# Patient Record
Sex: Female | Born: 1935
Health system: Southern US, Community
[De-identification: ages and names within clinical notes are randomized; demographics above are authoritative.]

## PROBLEM LIST (undated history)

## (undated) DIAGNOSIS — Z950 Presence of cardiac pacemaker: Secondary | ICD-10-CM

## (undated) DIAGNOSIS — I700 Atherosclerosis of aorta: Secondary | ICD-10-CM

## (undated) DIAGNOSIS — R350 Frequency of micturition: Secondary | ICD-10-CM

## (undated) DIAGNOSIS — M1990 Unspecified osteoarthritis, unspecified site: Secondary | ICD-10-CM

## (undated) DIAGNOSIS — IMO0001 Reserved for inherently not codable concepts without codable children: Secondary | ICD-10-CM

## (undated) DIAGNOSIS — I2699 Other pulmonary embolism without acute cor pulmonale: Secondary | ICD-10-CM

## (undated) DIAGNOSIS — I82442 Acute embolism and thrombosis of left tibial vein: Secondary | ICD-10-CM

## (undated) DIAGNOSIS — I441 Atrioventricular block, second degree: Secondary | ICD-10-CM

## (undated) DIAGNOSIS — R208 Other disturbances of skin sensation: Secondary | ICD-10-CM

## (undated) DIAGNOSIS — I10 Essential (primary) hypertension: Secondary | ICD-10-CM

## (undated) DIAGNOSIS — I82409 Acute embolism and thrombosis of unspecified deep veins of unspecified lower extremity: Secondary | ICD-10-CM

## (undated) HISTORY — PX: JOINT REPLACEMENT: SHX530

## (undated) HISTORY — PX: ABDOMINAL HYSTERECTOMY: SHX81

## (undated) HISTORY — PX: LAPAROSCOPIC CHOLECYSTECTOMY: SUR755

## (undated) HISTORY — PX: CATARACT EXTRACTION W/ INTRAOCULAR LENS  IMPLANT, BILATERAL: SHX1307

## (undated) HISTORY — PX: FRACTURE SURGERY: SHX138

---

## 1997-12-17 ENCOUNTER — Other Ambulatory Visit: Admission: RE | Admit: 1997-12-17 | Discharge: 1997-12-17 | Payer: Self-pay | Admitting: Obstetrics and Gynecology

## 1999-02-12 ENCOUNTER — Other Ambulatory Visit: Admission: RE | Admit: 1999-02-12 | Discharge: 1999-02-12 | Payer: Self-pay | Admitting: Obstetrics and Gynecology

## 2000-05-18 ENCOUNTER — Other Ambulatory Visit: Admission: RE | Admit: 2000-05-18 | Discharge: 2000-05-18 | Payer: Self-pay | Admitting: Obstetrics and Gynecology

## 2000-10-27 ENCOUNTER — Other Ambulatory Visit: Admission: RE | Admit: 2000-10-27 | Discharge: 2000-10-27 | Payer: Self-pay | Admitting: Orthopedic Surgery

## 2001-08-23 HISTORY — PX: ANKLE FRACTURE SURGERY: SHX122

## 2001-10-22 ENCOUNTER — Encounter: Payer: Self-pay | Admitting: Orthopaedic Surgery

## 2001-10-22 ENCOUNTER — Encounter: Payer: Self-pay | Admitting: Emergency Medicine

## 2001-10-22 ENCOUNTER — Inpatient Hospital Stay (HOSPITAL_COMMUNITY): Admission: EM | Admit: 2001-10-22 | Discharge: 2001-10-25 | Payer: Self-pay | Admitting: Emergency Medicine

## 2001-10-25 ENCOUNTER — Inpatient Hospital Stay (HOSPITAL_COMMUNITY)
Admission: RE | Admit: 2001-10-25 | Discharge: 2001-11-02 | Payer: Self-pay | Admitting: Physical Medicine & Rehabilitation

## 2001-12-05 ENCOUNTER — Encounter: Admission: RE | Admit: 2001-12-05 | Discharge: 2002-02-14 | Payer: Self-pay | Admitting: Orthopaedic Surgery

## 2002-08-06 ENCOUNTER — Other Ambulatory Visit: Admission: RE | Admit: 2002-08-06 | Discharge: 2002-08-06 | Payer: Self-pay | Admitting: Obstetrics and Gynecology

## 2003-10-18 ENCOUNTER — Other Ambulatory Visit: Admission: RE | Admit: 2003-10-18 | Discharge: 2003-10-18 | Payer: Self-pay | Admitting: Obstetrics and Gynecology

## 2009-09-04 DIAGNOSIS — E785 Hyperlipidemia, unspecified: Secondary | ICD-10-CM | POA: Insufficient documentation

## 2009-09-04 DIAGNOSIS — M489 Spondylopathy, unspecified: Secondary | ICD-10-CM | POA: Insufficient documentation

## 2009-09-04 DIAGNOSIS — I10 Essential (primary) hypertension: Secondary | ICD-10-CM | POA: Insufficient documentation

## 2009-09-04 DIAGNOSIS — I7090 Unspecified atherosclerosis: Secondary | ICD-10-CM | POA: Insufficient documentation

## 2009-09-04 DIAGNOSIS — D649 Anemia, unspecified: Secondary | ICD-10-CM | POA: Insufficient documentation

## 2009-09-04 DIAGNOSIS — I129 Hypertensive chronic kidney disease with stage 1 through stage 4 chronic kidney disease, or unspecified chronic kidney disease: Secondary | ICD-10-CM | POA: Insufficient documentation

## 2009-09-04 DIAGNOSIS — K449 Diaphragmatic hernia without obstruction or gangrene: Secondary | ICD-10-CM | POA: Insufficient documentation

## 2009-09-16 DIAGNOSIS — M810 Age-related osteoporosis without current pathological fracture: Secondary | ICD-10-CM | POA: Insufficient documentation

## 2009-09-16 DIAGNOSIS — K862 Cyst of pancreas: Secondary | ICD-10-CM | POA: Insufficient documentation

## 2010-03-31 DIAGNOSIS — I872 Venous insufficiency (chronic) (peripheral): Secondary | ICD-10-CM | POA: Insufficient documentation

## 2010-09-17 DIAGNOSIS — J309 Allergic rhinitis, unspecified: Secondary | ICD-10-CM | POA: Insufficient documentation

## 2010-12-29 ENCOUNTER — Emergency Department (HOSPITAL_BASED_OUTPATIENT_CLINIC_OR_DEPARTMENT_OTHER)
Admission: EM | Admit: 2010-12-29 | Discharge: 2010-12-29 | Disposition: A | Discharge status: Home / Self Care | Payer: Medicare Other | Attending: Emergency Medicine | Admitting: Emergency Medicine

## 2010-12-29 ENCOUNTER — Emergency Department (INDEPENDENT_AMBULATORY_CARE_PROVIDER_SITE_OTHER): Payer: Medicare Other

## 2010-12-29 DIAGNOSIS — I517 Cardiomegaly: Secondary | ICD-10-CM | POA: Insufficient documentation

## 2010-12-29 DIAGNOSIS — I10 Essential (primary) hypertension: Secondary | ICD-10-CM | POA: Insufficient documentation

## 2010-12-29 DIAGNOSIS — IMO0002 Reserved for concepts with insufficient information to code with codable children: Secondary | ICD-10-CM | POA: Insufficient documentation

## 2010-12-29 DIAGNOSIS — M549 Dorsalgia, unspecified: Secondary | ICD-10-CM

## 2010-12-29 DIAGNOSIS — X58XXXA Exposure to other specified factors, initial encounter: Secondary | ICD-10-CM | POA: Insufficient documentation

## 2010-12-29 DIAGNOSIS — M47814 Spondylosis without myelopathy or radiculopathy, thoracic region: Secondary | ICD-10-CM

## 2011-09-16 DIAGNOSIS — E119 Type 2 diabetes mellitus without complications: Secondary | ICD-10-CM | POA: Diagnosis not present

## 2011-09-16 DIAGNOSIS — M899 Disorder of bone, unspecified: Secondary | ICD-10-CM | POA: Diagnosis not present

## 2011-09-16 DIAGNOSIS — I10 Essential (primary) hypertension: Secondary | ICD-10-CM | POA: Diagnosis not present

## 2011-09-16 DIAGNOSIS — M949 Disorder of cartilage, unspecified: Secondary | ICD-10-CM | POA: Diagnosis not present

## 2011-09-16 DIAGNOSIS — E785 Hyperlipidemia, unspecified: Secondary | ICD-10-CM | POA: Diagnosis not present

## 2011-09-23 DIAGNOSIS — Z0000 Encounter for general adult medical examination without abnormal findings: Secondary | ICD-10-CM | POA: Diagnosis not present

## 2011-09-23 DIAGNOSIS — IMO0002 Reserved for concepts with insufficient information to code with codable children: Secondary | ICD-10-CM | POA: Diagnosis not present

## 2011-09-23 DIAGNOSIS — J309 Allergic rhinitis, unspecified: Secondary | ICD-10-CM | POA: Diagnosis not present

## 2011-09-23 DIAGNOSIS — E119 Type 2 diabetes mellitus without complications: Secondary | ICD-10-CM | POA: Diagnosis not present

## 2011-09-23 DIAGNOSIS — N3281 Overactive bladder: Secondary | ICD-10-CM | POA: Insufficient documentation

## 2011-09-27 DIAGNOSIS — K862 Cyst of pancreas: Secondary | ICD-10-CM | POA: Diagnosis not present

## 2011-09-27 DIAGNOSIS — Z1212 Encounter for screening for malignant neoplasm of rectum: Secondary | ICD-10-CM | POA: Diagnosis not present

## 2012-01-06 DIAGNOSIS — IMO0002 Reserved for concepts with insufficient information to code with codable children: Secondary | ICD-10-CM | POA: Diagnosis not present

## 2012-01-06 DIAGNOSIS — E119 Type 2 diabetes mellitus without complications: Secondary | ICD-10-CM | POA: Diagnosis not present

## 2012-01-06 DIAGNOSIS — L259 Unspecified contact dermatitis, unspecified cause: Secondary | ICD-10-CM | POA: Diagnosis not present

## 2012-01-06 DIAGNOSIS — I10 Essential (primary) hypertension: Secondary | ICD-10-CM | POA: Diagnosis not present

## 2012-04-07 DIAGNOSIS — Z1231 Encounter for screening mammogram for malignant neoplasm of breast: Secondary | ICD-10-CM | POA: Diagnosis not present

## 2012-04-07 DIAGNOSIS — Z803 Family history of malignant neoplasm of breast: Secondary | ICD-10-CM | POA: Diagnosis not present

## 2012-05-08 DIAGNOSIS — E119 Type 2 diabetes mellitus without complications: Secondary | ICD-10-CM | POA: Diagnosis not present

## 2012-05-08 DIAGNOSIS — Z23 Encounter for immunization: Secondary | ICD-10-CM | POA: Diagnosis not present

## 2012-05-08 DIAGNOSIS — L259 Unspecified contact dermatitis, unspecified cause: Secondary | ICD-10-CM | POA: Diagnosis not present

## 2012-05-08 DIAGNOSIS — J309 Allergic rhinitis, unspecified: Secondary | ICD-10-CM | POA: Diagnosis not present

## 2012-06-08 DIAGNOSIS — Z23 Encounter for immunization: Secondary | ICD-10-CM | POA: Diagnosis not present

## 2012-07-19 DIAGNOSIS — L820 Inflamed seborrheic keratosis: Secondary | ICD-10-CM | POA: Diagnosis not present

## 2012-07-19 DIAGNOSIS — B353 Tinea pedis: Secondary | ICD-10-CM | POA: Diagnosis not present

## 2012-10-30 DIAGNOSIS — E119 Type 2 diabetes mellitus without complications: Secondary | ICD-10-CM | POA: Diagnosis not present

## 2012-10-30 DIAGNOSIS — I10 Essential (primary) hypertension: Secondary | ICD-10-CM | POA: Diagnosis not present

## 2012-10-30 DIAGNOSIS — Z0000 Encounter for general adult medical examination without abnormal findings: Secondary | ICD-10-CM | POA: Diagnosis not present

## 2012-10-30 DIAGNOSIS — R82998 Other abnormal findings in urine: Secondary | ICD-10-CM | POA: Diagnosis not present

## 2012-10-30 DIAGNOSIS — E785 Hyperlipidemia, unspecified: Secondary | ICD-10-CM | POA: Diagnosis not present

## 2012-10-30 DIAGNOSIS — M899 Disorder of bone, unspecified: Secondary | ICD-10-CM | POA: Diagnosis not present

## 2012-11-09 DIAGNOSIS — IMO0002 Reserved for concepts with insufficient information to code with codable children: Secondary | ICD-10-CM | POA: Diagnosis not present

## 2012-11-09 DIAGNOSIS — N318 Other neuromuscular dysfunction of bladder: Secondary | ICD-10-CM | POA: Diagnosis not present

## 2012-11-09 DIAGNOSIS — J309 Allergic rhinitis, unspecified: Secondary | ICD-10-CM | POA: Diagnosis not present

## 2012-11-09 DIAGNOSIS — E119 Type 2 diabetes mellitus without complications: Secondary | ICD-10-CM | POA: Diagnosis not present

## 2012-11-09 DIAGNOSIS — M810 Age-related osteoporosis without current pathological fracture: Secondary | ICD-10-CM | POA: Diagnosis not present

## 2012-11-09 DIAGNOSIS — R82998 Other abnormal findings in urine: Secondary | ICD-10-CM | POA: Diagnosis not present

## 2012-11-09 DIAGNOSIS — K862 Cyst of pancreas: Secondary | ICD-10-CM | POA: Diagnosis not present

## 2012-11-09 DIAGNOSIS — Z1212 Encounter for screening for malignant neoplasm of rectum: Secondary | ICD-10-CM | POA: Diagnosis not present

## 2012-11-09 DIAGNOSIS — K449 Diaphragmatic hernia without obstruction or gangrene: Secondary | ICD-10-CM | POA: Diagnosis not present

## 2012-11-09 DIAGNOSIS — Z0000 Encounter for general adult medical examination without abnormal findings: Secondary | ICD-10-CM | POA: Diagnosis not present

## 2013-05-10 DIAGNOSIS — Z803 Family history of malignant neoplasm of breast: Secondary | ICD-10-CM | POA: Diagnosis not present

## 2013-05-10 DIAGNOSIS — Z1231 Encounter for screening mammogram for malignant neoplasm of breast: Secondary | ICD-10-CM | POA: Diagnosis not present

## 2013-06-14 DIAGNOSIS — Z23 Encounter for immunization: Secondary | ICD-10-CM | POA: Diagnosis not present

## 2013-11-06 DIAGNOSIS — E785 Hyperlipidemia, unspecified: Secondary | ICD-10-CM | POA: Diagnosis not present

## 2013-11-06 DIAGNOSIS — M899 Disorder of bone, unspecified: Secondary | ICD-10-CM | POA: Diagnosis not present

## 2013-11-06 DIAGNOSIS — M949 Disorder of cartilage, unspecified: Secondary | ICD-10-CM | POA: Diagnosis not present

## 2013-11-06 DIAGNOSIS — I10 Essential (primary) hypertension: Secondary | ICD-10-CM | POA: Diagnosis not present

## 2013-11-06 DIAGNOSIS — R82998 Other abnormal findings in urine: Secondary | ICD-10-CM | POA: Diagnosis not present

## 2013-11-06 DIAGNOSIS — E1169 Type 2 diabetes mellitus with other specified complication: Secondary | ICD-10-CM | POA: Diagnosis not present

## 2013-11-13 DIAGNOSIS — E119 Type 2 diabetes mellitus without complications: Secondary | ICD-10-CM | POA: Diagnosis not present

## 2013-11-13 DIAGNOSIS — I10 Essential (primary) hypertension: Secondary | ICD-10-CM | POA: Diagnosis not present

## 2013-11-13 DIAGNOSIS — IMO0002 Reserved for concepts with insufficient information to code with codable children: Secondary | ICD-10-CM | POA: Diagnosis not present

## 2013-11-13 DIAGNOSIS — K863 Pseudocyst of pancreas: Secondary | ICD-10-CM | POA: Diagnosis not present

## 2013-11-13 DIAGNOSIS — K862 Cyst of pancreas: Secondary | ICD-10-CM | POA: Diagnosis not present

## 2013-11-13 DIAGNOSIS — Z0000 Encounter for general adult medical examination without abnormal findings: Secondary | ICD-10-CM | POA: Diagnosis not present

## 2013-11-13 DIAGNOSIS — K449 Diaphragmatic hernia without obstruction or gangrene: Secondary | ICD-10-CM | POA: Diagnosis not present

## 2013-11-13 DIAGNOSIS — E785 Hyperlipidemia, unspecified: Secondary | ICD-10-CM | POA: Diagnosis not present

## 2013-11-13 DIAGNOSIS — I452 Bifascicular block: Secondary | ICD-10-CM | POA: Diagnosis not present

## 2013-11-14 DIAGNOSIS — Z1212 Encounter for screening for malignant neoplasm of rectum: Secondary | ICD-10-CM | POA: Diagnosis not present

## 2014-06-11 DIAGNOSIS — Z23 Encounter for immunization: Secondary | ICD-10-CM | POA: Diagnosis not present

## 2014-06-21 DIAGNOSIS — Z1231 Encounter for screening mammogram for malignant neoplasm of breast: Secondary | ICD-10-CM | POA: Diagnosis not present

## 2014-06-21 DIAGNOSIS — Z803 Family history of malignant neoplasm of breast: Secondary | ICD-10-CM | POA: Diagnosis not present

## 2014-11-19 DIAGNOSIS — E119 Type 2 diabetes mellitus without complications: Secondary | ICD-10-CM | POA: Diagnosis not present

## 2014-11-19 DIAGNOSIS — M810 Age-related osteoporosis without current pathological fracture: Secondary | ICD-10-CM | POA: Diagnosis not present

## 2014-11-19 DIAGNOSIS — R8299 Other abnormal findings in urine: Secondary | ICD-10-CM | POA: Diagnosis not present

## 2014-11-19 DIAGNOSIS — I10 Essential (primary) hypertension: Secondary | ICD-10-CM | POA: Diagnosis not present

## 2014-11-19 DIAGNOSIS — I7090 Unspecified atherosclerosis: Secondary | ICD-10-CM | POA: Diagnosis not present

## 2014-11-19 DIAGNOSIS — E785 Hyperlipidemia, unspecified: Secondary | ICD-10-CM | POA: Diagnosis not present

## 2014-11-29 DIAGNOSIS — N183 Chronic kidney disease, stage 3 (moderate): Secondary | ICD-10-CM | POA: Diagnosis not present

## 2014-11-29 DIAGNOSIS — K862 Cyst of pancreas: Secondary | ICD-10-CM | POA: Diagnosis not present

## 2014-11-29 DIAGNOSIS — E1151 Type 2 diabetes mellitus with diabetic peripheral angiopathy without gangrene: Secondary | ICD-10-CM | POA: Diagnosis not present

## 2014-11-29 DIAGNOSIS — K449 Diaphragmatic hernia without obstruction or gangrene: Secondary | ICD-10-CM | POA: Diagnosis not present

## 2014-11-29 DIAGNOSIS — E785 Hyperlipidemia, unspecified: Secondary | ICD-10-CM | POA: Diagnosis not present

## 2014-11-29 DIAGNOSIS — Z1389 Encounter for screening for other disorder: Secondary | ICD-10-CM | POA: Diagnosis not present

## 2014-11-29 DIAGNOSIS — Z6831 Body mass index (BMI) 31.0-31.9, adult: Secondary | ICD-10-CM | POA: Diagnosis not present

## 2014-11-29 DIAGNOSIS — I10 Essential (primary) hypertension: Secondary | ICD-10-CM | POA: Diagnosis not present

## 2014-11-29 DIAGNOSIS — M25551 Pain in right hip: Secondary | ICD-10-CM | POA: Diagnosis not present

## 2014-11-29 DIAGNOSIS — Z23 Encounter for immunization: Secondary | ICD-10-CM | POA: Diagnosis not present

## 2014-11-29 DIAGNOSIS — Z0000 Encounter for general adult medical examination without abnormal findings: Secondary | ICD-10-CM | POA: Diagnosis not present

## 2014-11-29 DIAGNOSIS — I7090 Unspecified atherosclerosis: Secondary | ICD-10-CM | POA: Diagnosis not present

## 2014-11-29 DIAGNOSIS — Z1212 Encounter for screening for malignant neoplasm of rectum: Secondary | ICD-10-CM | POA: Diagnosis not present

## 2014-12-02 DIAGNOSIS — Z961 Presence of intraocular lens: Secondary | ICD-10-CM | POA: Diagnosis not present

## 2014-12-02 DIAGNOSIS — H52203 Unspecified astigmatism, bilateral: Secondary | ICD-10-CM | POA: Diagnosis not present

## 2014-12-02 DIAGNOSIS — H3531 Nonexudative age-related macular degeneration: Secondary | ICD-10-CM | POA: Diagnosis not present

## 2014-12-16 DIAGNOSIS — M1611 Unilateral primary osteoarthritis, right hip: Secondary | ICD-10-CM | POA: Diagnosis not present

## 2014-12-16 DIAGNOSIS — M25551 Pain in right hip: Secondary | ICD-10-CM | POA: Diagnosis not present

## 2014-12-16 DIAGNOSIS — M1711 Unilateral primary osteoarthritis, right knee: Secondary | ICD-10-CM | POA: Diagnosis not present

## 2014-12-16 DIAGNOSIS — M25561 Pain in right knee: Secondary | ICD-10-CM | POA: Diagnosis not present

## 2014-12-24 DIAGNOSIS — M1611 Unilateral primary osteoarthritis, right hip: Secondary | ICD-10-CM | POA: Diagnosis not present

## 2014-12-24 DIAGNOSIS — M25551 Pain in right hip: Secondary | ICD-10-CM | POA: Diagnosis not present

## 2015-01-27 DIAGNOSIS — M25551 Pain in right hip: Secondary | ICD-10-CM | POA: Diagnosis not present

## 2015-01-27 DIAGNOSIS — M1611 Unilateral primary osteoarthritis, right hip: Secondary | ICD-10-CM | POA: Diagnosis not present

## 2015-03-06 ENCOUNTER — Other Ambulatory Visit (HOSPITAL_COMMUNITY): Payer: Self-pay | Admitting: Orthopaedic Surgery

## 2015-03-10 ENCOUNTER — Other Ambulatory Visit (HOSPITAL_COMMUNITY): Payer: Self-pay | Admitting: Orthopaedic Surgery

## 2015-03-17 NOTE — Pre-Procedure Instructions (Signed)
MARLETTA BOUSQUET  03/17/2015     Your procedure is scheduled on : Tuesday March 25, 2015 at 1:00 PM.  Report to [facility] Admitting at 11:00 A.M.  Call this number if you have problems the morning of surgery: [phone]    Remember:  Do not eat food or drink liquids after midnight.  Take these medicines the morning of surgery with A SIP OF WATER : NONE   Please stop taking any vitamins, herbal medications, supplements, Ibuprofen, Aleve, Fish oil, etc   Do not wear jewelry, make-up or nail polish.  Do not wear lotions, powders, or perfumes.   Do not shave 48 hours prior to surgery.  Do not bring valuables to the hospital.  [facility] is not responsible for any belongings or valuables.  Contacts, dentures or bridgework may not be worn into surgery.  Leave your suitcase in the car.  After surgery it may be brought to your room.  For patients admitted to the hospital, discharge time will be determined by your treatment team.  Patients discharged the day of surgery will not be allowed to drive home.   Name and phone number of your driver:    Special instructions:  Shower using CHG soap the night before and the morning of your surgery  Please read over the following fact sheets that you were given. Pain Booklet, Coughing and Deep Breathing, Total Joint Packet, MRSA Information and Surgical Site Infection Prevention

## 2015-03-18 ENCOUNTER — Encounter (HOSPITAL_COMMUNITY)
Admission: RE | Admit: 2015-03-18 | Discharge: 2015-03-18 | Disposition: A | Discharge status: Home / Self Care | Payer: Medicare Other | Source: Ambulatory Visit | Attending: Orthopaedic Surgery | Admitting: Orthopaedic Surgery

## 2015-03-18 ENCOUNTER — Encounter (HOSPITAL_COMMUNITY): Payer: Self-pay

## 2015-03-18 DIAGNOSIS — R9431 Abnormal electrocardiogram [ECG] [EKG]: Secondary | ICD-10-CM | POA: Insufficient documentation

## 2015-03-18 DIAGNOSIS — Z01812 Encounter for preprocedural laboratory examination: Secondary | ICD-10-CM | POA: Insufficient documentation

## 2015-03-18 DIAGNOSIS — Z01818 Encounter for other preprocedural examination: Secondary | ICD-10-CM | POA: Diagnosis not present

## 2015-03-18 DIAGNOSIS — I10 Essential (primary) hypertension: Secondary | ICD-10-CM | POA: Insufficient documentation

## 2015-03-18 DIAGNOSIS — M1611 Unilateral primary osteoarthritis, right hip: Secondary | ICD-10-CM | POA: Diagnosis not present

## 2015-03-18 DIAGNOSIS — Z7982 Long term (current) use of aspirin: Secondary | ICD-10-CM | POA: Insufficient documentation

## 2015-03-18 DIAGNOSIS — Z79899 Other long term (current) drug therapy: Secondary | ICD-10-CM | POA: Insufficient documentation

## 2015-03-18 HISTORY — DX: Other disturbances of skin sensation: R20.8

## 2015-03-18 HISTORY — DX: Reserved for inherently not codable concepts without codable children: IMO0001

## 2015-03-18 HISTORY — DX: Unspecified osteoarthritis, unspecified site: M19.90

## 2015-03-18 HISTORY — DX: Frequency of micturition: R35.0

## 2015-03-18 HISTORY — DX: Essential (primary) hypertension: I10

## 2015-03-18 LAB — CBC
HEMATOCRIT: 43.2 % (ref 36.0–46.0)
Hemoglobin: 13.6 g/dL (ref 12.0–15.0)
MCH: 26.6 pg (ref 26.0–34.0)
MCHC: 31.5 g/dL (ref 30.0–36.0)
MCV: 84.5 fL (ref 78.0–100.0)
Platelets: 225 10*3/uL (ref 150–400)
RBC: 5.11 MIL/uL (ref 3.87–5.11)
RDW: 15 % (ref 11.5–15.5)
WBC: 6.6 10*3/uL (ref 4.0–10.5)

## 2015-03-18 LAB — COMPREHENSIVE METABOLIC PANEL
ALBUMIN: 4 g/dL (ref 3.5–5.0)
ALT: 14 U/L (ref 14–54)
AST: 20 U/L (ref 15–41)
Alkaline Phosphatase: 47 U/L (ref 38–126)
Anion gap: 7 (ref 5–15)
BILIRUBIN TOTAL: 0.8 mg/dL (ref 0.3–1.2)
BUN: 22 mg/dL — AB (ref 6–20)
CO2: 31 mmol/L (ref 22–32)
CREATININE: 0.72 mg/dL (ref 0.44–1.00)
Calcium: 10.4 mg/dL — ABNORMAL HIGH (ref 8.9–10.3)
Chloride: 102 mmol/L (ref 101–111)
GFR calc non Af Amer: >60 mL/min (ref >60)
GLUCOSE: 122 mg/dL — AB (ref 65–99)
POTASSIUM: 4.6 mmol/L (ref 3.5–5.1)
Sodium: 140 mmol/L (ref 135–145)
TOTAL PROTEIN: 7.3 g/dL (ref 6.5–8.1)

## 2015-03-18 LAB — SURGICAL PCR SCREEN
MRSA, PCR: NEGATIVE
STAPHYLOCOCCUS AUREUS: NEGATIVE

## 2015-03-18 NOTE — Progress Notes (Signed)
PCP is Ravisankar Avva. Patient denied having any acute cardiac or pulmonary issues.   Patients husband at chair side during PAT visit.   RBBB noted on patients EKG. Will request a EKG for comparison from PCP office.

## 2015-03-19 NOTE — Progress Notes (Signed)
Anesthesia Chart Review:  Pt is 81 year old female scheduled for R total hip arthroplasty anterior approach on 03/25/2015 with Dr. Maureen Ralphs.   PCP is Dr. Felipa Eth.   PMH includes: HTN, dyspnea with exertion. Never smoker. BMI 31.   Medications include: ASA, olmesartan-hctz.   Preoperative labs reviewed.    EKG 03/18/2015: NSR. LAD. Incomplete RBBB. Cannot rule out anterior infarct, age undetermined. No significant change since last tracing 10/22/01 per Dr. Thomasene Lot interpretation. A similar appearing EKG was received from Dr. Vicente Males office dated 11/13/13; his notes indicate pt's EKG is stable compared to 2006 and 2010 tracings at his office.   If no changes, I anticipate pt can proceed with surgery as scheduled.   Rica Mast, FNP-BC [facility]/Anesthesiology Phone: [phone] 03/19/2015 3:16 PM

## 2015-03-24 MED ORDER — CEFAZOLIN SODIUM-DEXTROSE 2-3 GM-% IV SOLR
2.0000 g | INTRAVENOUS | Status: AC
  Administered 2015-03-25: 2 g via INTRAVENOUS

## 2015-03-24 MED ORDER — TRANEXAMIC ACID 1000 MG/10ML IV SOLN
1000.0000 mg | INTRAVENOUS | Status: AC
  Administered 2015-03-25: 1000 mg via INTRAVENOUS
  Filled 2015-03-24: qty 10

## 2015-03-24 NOTE — Progress Notes (Signed)
Patient notified to arrive at 10:15. Verbalized understanding

## 2015-03-25 ENCOUNTER — Inpatient Hospital Stay (HOSPITAL_COMMUNITY): Payer: Medicare Other

## 2015-03-25 ENCOUNTER — Encounter (HOSPITAL_COMMUNITY)
Admission: RE | Disposition: A | Discharge status: Skilled Nursing Facility | Payer: Self-pay | Source: Ambulatory Visit | Attending: Orthopaedic Surgery

## 2015-03-25 ENCOUNTER — Encounter (HOSPITAL_COMMUNITY): Payer: Self-pay | Admitting: *Deleted

## 2015-03-25 ENCOUNTER — Inpatient Hospital Stay (HOSPITAL_COMMUNITY): Payer: Medicare Other | Admitting: Vascular Surgery

## 2015-03-25 ENCOUNTER — Inpatient Hospital Stay (HOSPITAL_COMMUNITY): Payer: Medicare Other | Admitting: Anesthesiology

## 2015-03-25 ENCOUNTER — Inpatient Hospital Stay (HOSPITAL_COMMUNITY)
Admission: RE | Admit: 2015-03-25 | Discharge: 2015-03-28 | DRG: 470 | Disposition: A | Discharge status: Skilled Nursing Facility | Payer: Medicare Other | Source: Ambulatory Visit | Attending: Orthopaedic Surgery | Admitting: Orthopaedic Surgery

## 2015-03-25 DIAGNOSIS — Z79899 Other long term (current) drug therapy: Secondary | ICD-10-CM

## 2015-03-25 DIAGNOSIS — I10 Essential (primary) hypertension: Secondary | ICD-10-CM | POA: Diagnosis present

## 2015-03-25 DIAGNOSIS — M169 Osteoarthritis of hip, unspecified: Secondary | ICD-10-CM | POA: Diagnosis not present

## 2015-03-25 DIAGNOSIS — Z7982 Long term (current) use of aspirin: Secondary | ICD-10-CM

## 2015-03-25 DIAGNOSIS — Z419 Encounter for procedure for purposes other than remedying health state, unspecified: Secondary | ICD-10-CM

## 2015-03-25 DIAGNOSIS — Z96641 Presence of right artificial hip joint: Secondary | ICD-10-CM

## 2015-03-25 DIAGNOSIS — M6281 Muscle weakness (generalized): Secondary | ICD-10-CM | POA: Diagnosis not present

## 2015-03-25 DIAGNOSIS — M1611 Unilateral primary osteoarthritis, right hip: Secondary | ICD-10-CM | POA: Diagnosis not present

## 2015-03-25 DIAGNOSIS — R0602 Shortness of breath: Secondary | ICD-10-CM | POA: Diagnosis not present

## 2015-03-25 DIAGNOSIS — R278 Other lack of coordination: Secondary | ICD-10-CM | POA: Diagnosis not present

## 2015-03-25 DIAGNOSIS — K219 Gastro-esophageal reflux disease without esophagitis: Secondary | ICD-10-CM | POA: Diagnosis not present

## 2015-03-25 DIAGNOSIS — M25551 Pain in right hip: Secondary | ICD-10-CM | POA: Diagnosis not present

## 2015-03-25 DIAGNOSIS — R2681 Unsteadiness on feet: Secondary | ICD-10-CM | POA: Diagnosis not present

## 2015-03-25 DIAGNOSIS — D62 Acute posthemorrhagic anemia: Secondary | ICD-10-CM | POA: Diagnosis not present

## 2015-03-25 DIAGNOSIS — Z471 Aftercare following joint replacement surgery: Secondary | ICD-10-CM | POA: Diagnosis not present

## 2015-03-25 HISTORY — DX: Unilateral primary osteoarthritis, right hip: M16.11

## 2015-03-25 HISTORY — PX: TOTAL HIP ARTHROPLASTY: SHX124

## 2015-03-25 HISTORY — DX: Presence of right artificial hip joint: Z96.641

## 2015-03-25 SURGERY — ARTHROPLASTY, HIP, TOTAL, ANTERIOR APPROACH
Anesthesia: General | Site: Hip | Laterality: Right

## 2015-03-25 MED ORDER — MENTHOL 3 MG MT LOZG
1.0000 | LOZENGE | OROMUCOSAL | Status: DC | PRN
Start: 2015-03-25 — End: 2015-03-28

## 2015-03-25 MED ORDER — PROPOFOL 10 MG/ML IV BOLUS
INTRAVENOUS | Status: DC | PRN
  Administered 2015-03-25: 20 mg via INTRAVENOUS
  Administered 2015-03-25: 10 mg via INTRAVENOUS
  Administered 2015-03-25: 40 mg via INTRAVENOUS

## 2015-03-25 MED ORDER — ONDANSETRON HCL 4 MG/2ML IJ SOLN
INTRAMUSCULAR | Status: AC
  Filled 2015-03-25: qty 2

## 2015-03-25 MED ORDER — METHOCARBAMOL 500 MG PO TABS
500.0000 mg | ORAL_TABLET | Freq: Four times a day (QID) | ORAL | Status: DC | PRN
  Administered 2015-03-26 – 2015-03-28 (×3): 500 mg via ORAL
  Filled 2015-03-25 (×3): qty 1

## 2015-03-25 MED ORDER — METOCLOPRAMIDE HCL 5 MG PO TABS
5.0000 mg | ORAL_TABLET | Freq: Three times a day (TID) | ORAL | Status: DC | PRN
Start: 2015-03-25 — End: 2015-03-28

## 2015-03-25 MED ORDER — FENTANYL CITRATE (PF) 100 MCG/2ML IJ SOLN
INTRAMUSCULAR | Status: DC | PRN
  Administered 2015-03-25: 50 ug via INTRAVENOUS
  Administered 2015-03-25: 25 ug via INTRAVENOUS
  Administered 2015-03-25: 50 ug via INTRAVENOUS
  Administered 2015-03-25: 25 ug via INTRAVENOUS

## 2015-03-25 MED ORDER — METOCLOPRAMIDE HCL 5 MG/ML IJ SOLN: 5.0000 mg | Freq: Three times a day (TID) | INTRAMUSCULAR | Status: DC | PRN

## 2015-03-25 MED ORDER — DOCUSATE SODIUM 100 MG PO CAPS
100.0000 mg | ORAL_CAPSULE | Freq: Two times a day (BID) | ORAL | Status: DC
  Administered 2015-03-25 – 2015-03-28 (×5): 100 mg via ORAL
  Filled 2015-03-25 (×5): qty 1

## 2015-03-25 MED ORDER — HYDROMORPHONE HCL 1 MG/ML IJ SOLN: 0.5000 mg | INTRAMUSCULAR | Status: DC | PRN

## 2015-03-25 MED ORDER — SODIUM CHLORIDE 0.9 % IR SOLN
Status: DC | PRN
  Administered 2015-03-25: 3000 mL

## 2015-03-25 MED ORDER — EPHEDRINE SULFATE 50 MG/ML IJ SOLN
INTRAMUSCULAR | Status: DC | PRN
  Administered 2015-03-25 (×3): 5 mg via INTRAVENOUS

## 2015-03-25 MED ORDER — ONDANSETRON HCL 4 MG/2ML IJ SOLN: 4.0000 mg | Freq: Four times a day (QID) | INTRAMUSCULAR | Status: DC | PRN

## 2015-03-25 MED ORDER — ACETAMINOPHEN 325 MG PO TABS: 650.0000 mg | ORAL_TABLET | Freq: Four times a day (QID) | ORAL | Status: DC | PRN

## 2015-03-25 MED ORDER — PROPOFOL INFUSION 10 MG/ML OPTIME
INTRAVENOUS | Status: DC | PRN
  Administered 2015-03-25: 25 ug/kg/min via INTRAVENOUS

## 2015-03-25 MED ORDER — CEFAZOLIN SODIUM 1-5 GM-% IV SOLN
1.0000 g | Freq: Four times a day (QID) | INTRAVENOUS | Status: AC
  Administered 2015-03-25 (×2): 1 g via INTRAVENOUS
  Filled 2015-03-25 (×2): qty 50

## 2015-03-25 MED ORDER — PROMETHAZINE HCL 25 MG/ML IJ SOLN: 6.2500 mg | INTRAMUSCULAR | Status: DC | PRN

## 2015-03-25 MED ORDER — FENTANYL CITRATE (PF) 250 MCG/5ML IJ SOLN
INTRAMUSCULAR | Status: AC
  Filled 2015-03-25: qty 5

## 2015-03-25 MED ORDER — BUPIVACAINE HCL (PF) 0.5 % IJ SOLN
INTRAMUSCULAR | Status: DC | PRN
  Administered 2015-03-25: 2.8 mL via INTRATHECAL

## 2015-03-25 MED ORDER — SUCCINYLCHOLINE CHLORIDE 20 MG/ML IJ SOLN
INTRAMUSCULAR | Status: AC
Start: 2015-03-25 — End: 2015-03-25
  Filled 2015-03-25: qty 1

## 2015-03-25 MED ORDER — ZOLPIDEM TARTRATE 5 MG PO TABS: 5.0000 mg | ORAL_TABLET | Freq: Every evening | ORAL | Status: DC | PRN

## 2015-03-25 MED ORDER — HYDROCODONE-ACETAMINOPHEN 5-325 MG PO TABS
1.0000 | ORAL_TABLET | ORAL | Status: DC | PRN
  Administered 2015-03-25 – 2015-03-28 (×13): 2 via ORAL
  Filled 2015-03-25 (×13): qty 2

## 2015-03-25 MED ORDER — PHENOL 1.4 % MT LIQD: 1.0000 | OROMUCOSAL | Status: DC | PRN

## 2015-03-25 MED ORDER — ASPIRIN EC 325 MG PO TBEC
325.0000 mg | DELAYED_RELEASE_TABLET | Freq: Two times a day (BID) | ORAL | Status: DC
  Administered 2015-03-25 – 2015-03-28 (×6): 325 mg via ORAL
  Filled 2015-03-25 (×6): qty 1

## 2015-03-25 MED ORDER — OLMESARTAN MEDOXOMIL-HCTZ 40-25 MG PO TABS: 1.0000 | ORAL_TABLET | Freq: Every day | ORAL | Status: DC

## 2015-03-25 MED ORDER — SODIUM CHLORIDE 0.9 % IV SOLN
INTRAVENOUS | Status: DC
  Administered 2015-03-26: 01:00:00 via INTRAVENOUS

## 2015-03-25 MED ORDER — LACTATED RINGERS IV SOLN
Freq: Once | INTRAVENOUS | Status: AC
  Administered 2015-03-25: 11:00:00 via INTRAVENOUS

## 2015-03-25 MED ORDER — ACETAMINOPHEN 650 MG RE SUPP: 650.0000 mg | Freq: Four times a day (QID) | RECTAL | Status: DC | PRN

## 2015-03-25 MED ORDER — HYDROCHLOROTHIAZIDE 25 MG PO TABS
25.0000 mg | ORAL_TABLET | Freq: Every day | ORAL | Status: DC
  Administered 2015-03-26 – 2015-03-28 (×3): 25 mg via ORAL
  Filled 2015-03-25 (×4): qty 1

## 2015-03-25 MED ORDER — PROPOFOL 10 MG/ML IV BOLUS
INTRAVENOUS | Status: AC
  Filled 2015-03-25: qty 20

## 2015-03-25 MED ORDER — FENTANYL CITRATE (PF) 100 MCG/2ML IJ SOLN: 25.0000 ug | INTRAMUSCULAR | Status: DC | PRN

## 2015-03-25 MED ORDER — DIPHENHYDRAMINE HCL 12.5 MG/5ML PO ELIX: 12.5000 mg | ORAL_SOLUTION | ORAL | Status: DC | PRN

## 2015-03-25 MED ORDER — DEXTROSE 5 % IV SOLN
500.0000 mg | Freq: Four times a day (QID) | INTRAVENOUS | Status: DC | PRN
  Filled 2015-03-25: qty 5

## 2015-03-25 MED ORDER — ONDANSETRON HCL 4 MG/2ML IJ SOLN
INTRAMUSCULAR | Status: DC | PRN
  Administered 2015-03-25: 4 mg via INTRAVENOUS

## 2015-03-25 MED ORDER — ONDANSETRON HCL 4 MG PO TABS: 4.0000 mg | ORAL_TABLET | Freq: Four times a day (QID) | ORAL | Status: DC | PRN

## 2015-03-25 MED ORDER — LACTATED RINGERS IV SOLN
INTRAVENOUS | Status: DC | PRN
  Administered 2015-03-25 (×2): via INTRAVENOUS

## 2015-03-25 MED ORDER — IRBESARTAN 300 MG PO TABS
300.0000 mg | ORAL_TABLET | Freq: Every day | ORAL | Status: DC
  Administered 2015-03-26 – 2015-03-28 (×3): 300 mg via ORAL
  Filled 2015-03-25 (×4): qty 1

## 2015-03-25 MED ORDER — 0.9 % SODIUM CHLORIDE (POUR BTL) OPTIME
TOPICAL | Status: DC | PRN
  Administered 2015-03-25: 1000 mL

## 2015-03-25 SURGICAL SUPPLY — 53 items
BENZOIN TINCTURE PRP APPL 2/3 (GAUZE/BANDAGES/DRESSINGS) ×3 IMPLANT
BLADE SAW SGTL 18X1.27X75 (BLADE) ×2 IMPLANT
BLADE SAW SGTL 18X1.27X75MM (BLADE) ×1
BLADE SURG ROTATE 9660 (MISCELLANEOUS) IMPLANT
CAPT HIP TOTAL 2 ×3 IMPLANT
CELLS DAT CNTRL 66122 CELL SVR (MISCELLANEOUS) ×1 IMPLANT
CLOSURE STERI-STRIP 1/2X4 (GAUZE/BANDAGES/DRESSINGS) ×1
CLOSURE WOUND 1/2 X4 (GAUZE/BANDAGES/DRESSINGS) ×2
CLSR STERI-STRIP ANTIMIC 1/2X4 (GAUZE/BANDAGES/DRESSINGS) ×2 IMPLANT
COVER SURGICAL LIGHT HANDLE (MISCELLANEOUS) ×3 IMPLANT
DRAPE C-ARM 42X72 X-RAY (DRAPES) ×3 IMPLANT
DRAPE STERI IOBAN 125X83 (DRAPES) ×3 IMPLANT
DRAPE U-SHAPE 47X51 STRL (DRAPES) ×9 IMPLANT
DRSG AQUACEL AG ADV 3.5X10 (GAUZE/BANDAGES/DRESSINGS) ×3 IMPLANT
DURAPREP 26ML APPLICATOR (WOUND CARE) ×3 IMPLANT
ELECT BLADE 4.0 EZ CLEAN MEGAD (MISCELLANEOUS) ×3
ELECT BLADE 6.5 EXT (BLADE) IMPLANT
ELECT REM PT RETURN 9FT ADLT (ELECTROSURGICAL) ×3
ELECTRODE BLDE 4.0 EZ CLN MEGD (MISCELLANEOUS) ×1 IMPLANT
ELECTRODE REM PT RTRN 9FT ADLT (ELECTROSURGICAL) ×1 IMPLANT
FACESHIELD WRAPAROUND (MASK) ×6 IMPLANT
GLOVE BIOGEL PI IND STRL 8 (GLOVE) ×2 IMPLANT
GLOVE BIOGEL PI INDICATOR 8 (GLOVE) ×4
GLOVE ECLIPSE 8.0 STRL XLNG CF (GLOVE) ×3 IMPLANT
GLOVE ORTHO TXT STRL SZ7.5 (GLOVE) ×6 IMPLANT
GOWN STRL REUS W/ TWL LRG LVL3 (GOWN DISPOSABLE) ×2 IMPLANT
GOWN STRL REUS W/ TWL XL LVL3 (GOWN DISPOSABLE) ×2 IMPLANT
GOWN STRL REUS W/TWL LRG LVL3 (GOWN DISPOSABLE) ×4
GOWN STRL REUS W/TWL XL LVL3 (GOWN DISPOSABLE) ×4
HANDPIECE INTERPULSE COAX TIP (DISPOSABLE) ×2
KIT BASIN OR (CUSTOM PROCEDURE TRAY) ×3 IMPLANT
KIT ROOM TURNOVER OR (KITS) ×3 IMPLANT
MANIFOLD NEPTUNE II (INSTRUMENTS) ×3 IMPLANT
NS IRRIG 1000ML POUR BTL (IV SOLUTION) ×3 IMPLANT
PACK TOTAL JOINT (CUSTOM PROCEDURE TRAY) ×3 IMPLANT
PACK UNIVERSAL I (CUSTOM PROCEDURE TRAY) ×3 IMPLANT
PAD ARMBOARD 7.5X6 YLW CONV (MISCELLANEOUS) ×3 IMPLANT
RTRCTR WOUND ALEXIS 18CM MED (MISCELLANEOUS) ×3
SET HNDPC FAN SPRY TIP SCT (DISPOSABLE) ×1 IMPLANT
STAPLER VISISTAT 35W (STAPLE) IMPLANT
STRIP CLOSURE SKIN 1/2X4 (GAUZE/BANDAGES/DRESSINGS) ×4 IMPLANT
SUT ETHIBOND NAB CT1 #1 30IN (SUTURE) ×3 IMPLANT
SUT MNCRL AB 4-0 PS2 18 (SUTURE) IMPLANT
SUT VIC AB 0 CT1 27 (SUTURE) ×2
SUT VIC AB 0 CT1 27XBRD ANBCTR (SUTURE) ×1 IMPLANT
SUT VIC AB 1 CT1 27 (SUTURE) ×2
SUT VIC AB 1 CT1 27XBRD ANBCTR (SUTURE) ×1 IMPLANT
SUT VIC AB 2-0 CT1 27 (SUTURE) ×4
SUT VIC AB 2-0 CT1 TAPERPNT 27 (SUTURE) ×2 IMPLANT
TOWEL OR 17X24 6PK STRL BLUE (TOWEL DISPOSABLE) ×3 IMPLANT
TOWEL OR 17X26 10 PK STRL BLUE (TOWEL DISPOSABLE) ×3 IMPLANT
TRAY FOLEY CATH 16FRSI W/METER (SET/KITS/TRAYS/PACK) ×3 IMPLANT
WATER STERILE IRR 1000ML POUR (IV SOLUTION) IMPLANT

## 2015-03-25 NOTE — Brief Op Note (Signed)
03/25/2015  2:08 PM  PATIENT:  Lindsey Ross  81 y.o. female  PRE-OPERATIVE DIAGNOSIS:  Severe primary osteoarthritis right hip  POST-OPERATIVE DIAGNOSIS:  Severe primary osteoarthritis right hip  PROCEDURE:  Procedure(s): RIGHT TOTAL HIP ARTHROPLASTY ANTERIOR APPROACH (Right)  SURGEON:  Surgeon(s) and Role:    * Kathryne Hitch, MD - Primary  PHYSICIAN ASSISTANT: Rexene Edison, PA-C  ANESTHESIA:   spinal  EBL:  Total I/O In: 1000 [I.V.:1000] Out: 175 [Urine:175]  BLOOD ADMINISTERED:none  DRAINS: none   LOCAL MEDICATIONS USED:  NONE  SPECIMEN:  No Specimen  DISPOSITION OF SPECIMEN:  N/A  COUNTS:  YES  TOURNIQUET:  * No tourniquets in log *  DICTATION: .Other Dictation: Dictation Number [id number]  PLAN OF CARE: Admit to inpatient   PATIENT DISPOSITION:  PACU - hemodynamically stable.   Delay start of Pharmacological VTE agent (>24hrs) due to surgical blood loss or risk of bleeding: no

## 2015-03-25 NOTE — Transfer of Care (Signed)
Immediate Anesthesia Transfer of Care Note  Patient: Lindsey Ross  Procedure(s) Performed: Procedure(s): RIGHT TOTAL HIP ARTHROPLASTY ANTERIOR APPROACH (Right)  Patient Location: PACU  Anesthesia Type:General and Spinal  Level of Consciousness: awake, alert , oriented and sedated  Airway & Oxygen Therapy: Patient Spontanous Breathing and Patient connected to nasal cannula oxygen  Post-op Assessment: Report given to RN, Post -op Vital signs reviewed and stable and Patient moving all extremities  Post vital signs: Reviewed and stable  Last Vitals:  Filed Vitals:   03/25/15 1101  BP: 157/87  Pulse: 88  Temp: 36.8 C  Resp: 20    Complications: No apparent anesthesia complications

## 2015-03-25 NOTE — Anesthesia Procedure Notes (Addendum)
Spinal Patient location during procedure: OR Staffing Anesthesiologist: Suzette Battiest Performed by: anesthesiologist  Preanesthetic Checklist Completed: patient identified, site marked, surgical consent, pre-op evaluation, timeout performed, IV checked, risks and benefits discussed and monitors and equipment checked Spinal Block Patient position: sitting Prep: Betadine Patient monitoring: heart rate, continuous pulse ox and blood pressure Approach: right paramedian Location: L4-5 Injection technique: single-shot Needle Needle type: Spinocan  Needle gauge: 22 G Needle length: 9 cm Additional Notes Expiration date of kit checked and confirmed. Patient tolerated procedure well, without complications. Midline pass x 3 attempted with 24g pencan without return of CSF. Successful CSF return paramedian with 22g quinke. Aspiration CSF before and after injection.   Procedure Name: LMA Insertion Date/Time: 03/25/2015 1:22 PM Performed by: Scheryl Darter Pre-anesthesia Checklist: Patient identified, Emergency Drugs available, Suction available, Patient being monitored and Timeout performed Patient Re-evaluated:Patient Re-evaluated prior to inductionOxygen Delivery Method: Circle system utilized Preoxygenation: Pre-oxygenation with 100% oxygen Intubation Type: IV induction Ventilation: Mask ventilation without difficulty LMA: LMA inserted LMA Size: 4.0 Placement Confirmation: positive ETCO2 and breath sounds checked- equal and bilateral Tube secured with: Tape Dental Injury: Teeth and Oropharynx as per pre-operative assessment

## 2015-03-25 NOTE — Anesthesia Preprocedure Evaluation (Addendum)
Anesthesia Evaluation  Patient identified by MRN, date of birth, ID band Patient awake    Reviewed: Allergy & Precautions, NPO status , Patient's Chart, lab work & pertinent test results  History of Anesthesia Complications Negative for: history of anesthetic complications  Airway Mallampati: II  TM Distance: >3 FB Neck ROM: Full    Dental  (+) Teeth Intact   Pulmonary shortness of breath,  breath sounds clear to auscultation        Cardiovascular hypertension, Rhythm:Regular Rate:Normal     Neuro/Psych    GI/Hepatic negative GI ROS, Neg liver ROS,   Endo/Other  negative endocrine ROS  Renal/GU negative Renal ROS     Musculoskeletal  (+) Arthritis -,   Abdominal   Peds  Hematology   Anesthesia Other Findings   Reproductive/Obstetrics                            Anesthesia Physical Anesthesia Plan  ASA: III  Anesthesia Plan: Spinal   Post-op Pain Management:    Induction: Intravenous  Airway Management Planned: Natural Airway  Additional Equipment:   Intra-op Plan:   Post-operative Plan:   Informed Consent: I have reviewed the patients History and Physical, chart, labs and discussed the procedure including the risks, benefits and alternatives for the proposed anesthesia with the patient or authorized representative who has indicated his/her understanding and acceptance.     Plan Discussed with: CRNA and Surgeon  Anesthesia Plan Comments:        Anesthesia Quick Evaluation

## 2015-03-25 NOTE — H&P (Signed)
TOTAL HIP ADMISSION H&P  Patient is admitted for right total hip arthroplasty.  Subjective:  Chief Complaint: right hip pain  HPI: Lindsey Ross, 81 y.o. female, has a history of pain and functional disability in the right hip(s) due to arthritis and patient has failed non-surgical conservative treatments for greater than 12 weeks to include NSAID's and/or analgesics, corticosteriod injections, flexibility and strengthening excercises, use of assistive devices and activity modification.  Onset of symptoms was gradual starting 5 years ago with gradually worsening course since that time.The patient noted no past surgery on the right hip(s).  Patient currently rates pain in the right hip at 10 out of 10 with activity. Patient has night pain, worsening of pain with activity and weight bearing, trendelenberg gait, pain that interfers with activities of daily living, pain with passive range of motion and crepitus. Patient has evidence of subchondral cysts, subchondral sclerosis, periarticular osteophytes and joint space narrowing by imaging studies. This condition presents safety issues increasing the risk of falls.  There is no current active infection.  Patient Active Problem List   Diagnosis Date Noted  . Osteoarthritis of right hip 03/25/2015   Past Medical History  Diagnosis Date  . Hypertension   . Shortness of breath dyspnea     with exertion  . Frequent urination   . Arthritis   . Burning sensation of feet     Bilateral feet    Past Surgical History  Procedure Laterality Date  . Abdominal hysterectomy    . Cholecystectomy    . Ankle fracture surgery Right 2003  . Eye surgery Bilateral     cataract removal    Prescriptions prior to admission  Medication Sig Dispense Refill Last Dose  . acetaminophen (TYLENOL) 325 MG tablet Take 650 mg by mouth every 8 (eight) hours as needed for mild pain.   03/24/2015 at Unknown time  . aspirin 81 MG tablet Take 81 mg by mouth daily.   Past Week  at Unknown time  . Multiple Vitamins-Minerals (MULTIVITAMIN WITH MINERALS) tablet Take 1 tablet by mouth daily.   Past Week at Unknown time  . naproxen sodium (ALEVE) 220 MG tablet Take 440 mg by mouth 2 (two) times daily as needed (pain).   Past Week at Unknown time  . olmesartan-hydrochlorothiazide (BENICAR HCT) 40-25 MG per tablet Take 1 tablet by mouth daily.   03/25/2015 at 0730  . Omega-3 Fatty Acids (FISH OIL) 1000 MG CAPS Take 1 capsule by mouth daily.   Past Week at Unknown time  . Oyster Shell (OYSTER CALCIUM) 500 MG TABS tablet Take 500 mg of elemental calcium by mouth 2 (two) times daily.   Past Week at Unknown time   No Known Allergies  History  Substance Use Topics  . Smoking status: Never Smoker   . Smokeless tobacco: Not on file  . Alcohol Use: No    History reviewed. No pertinent family history.   Review of Systems  Musculoskeletal: Positive for joint pain.  All other systems reviewed and are negative.   Objective:  Physical Exam  Constitutional: She is oriented to person, place, and time. She appears well-developed and well-nourished.  HENT:  Head: Normocephalic and atraumatic.  Eyes: EOM are normal. Pupils are equal, round, and reactive to light.  Neck: Normal range of motion. Neck supple.  Cardiovascular: Normal rate.   Respiratory: Effort normal and breath sounds normal.  GI: Soft. Bowel sounds are normal.  Musculoskeletal:       Right hip: She  exhibits decreased range of motion, decreased strength, tenderness and bony tenderness.  Neurological: She is alert and oriented to person, place, and time.  Skin: Skin is warm and dry.  Psychiatric: She has a normal mood and affect.    Vital signs in last 24 hours: Temp:  [98.3 F (36.8 C)] 98.3 F (36.8 C) (08/02 1101) Pulse Rate:  [88] 88 (08/02 1101) Resp:  [20] 20 (08/02 1101) BP: (157)/(87) 157/87 mmHg (08/02 1101) SpO2:  [99 %] 99 % (08/02 1101) Weight:  [82.101 kg (181 lb)] 82.101 kg (181 lb) (08/02  1101)  Labs:   Estimated body mass index is 31.05 kg/(m^2) as calculated from the following:   Height as of this encounter: 5\' 4"  (1.626 m).   Weight as of this encounter: 82.101 kg (181 lb).   Imaging Review Plain radiographs demonstrate severe degenerative joint disease of the right hip(s). The bone quality appears to be good for age and reported activity level.  Assessment/Plan:  End stage arthritis, right hip(s)  The patient history, physical examination, clinical judgement of the provider and imaging studies are consistent with end stage degenerative joint disease of the right hip(s) and total hip arthroplasty is deemed medically necessary. The treatment options including medical management, injection therapy, arthroscopy and arthroplasty were discussed at length. The risks and benefits of total hip arthroplasty were presented and reviewed. The risks due to aseptic loosening, infection, stiffness, dislocation/subluxation,  thromboembolic complications and other imponderables were discussed.  The patient acknowledged the explanation, agreed to proceed with the plan and consent was signed. Patient is being admitted for inpatient treatment for surgery, pain control, PT, OT, prophylactic antibiotics, VTE prophylaxis, progressive ambulation and ADL's and discharge planning.The patient is planning to be discharged to skilled nursing facility

## 2015-03-26 ENCOUNTER — Encounter (HOSPITAL_COMMUNITY): Payer: Self-pay | Admitting: Orthopaedic Surgery

## 2015-03-26 LAB — CBC
HCT: 35.2 % — ABNORMAL LOW (ref 36.0–46.0)
HEMOGLOBIN: 11.1 g/dL — AB (ref 12.0–15.0)
MCH: 26.9 pg (ref 26.0–34.0)
MCHC: 31.5 g/dL (ref 30.0–36.0)
MCV: 85.2 fL (ref 78.0–100.0)
PLATELETS: 193 10*3/uL (ref 150–400)
RBC: 4.13 MIL/uL (ref 3.87–5.11)
RDW: 14.8 % (ref 11.5–15.5)
WBC: 6 10*3/uL (ref 4.0–10.5)

## 2015-03-26 LAB — BASIC METABOLIC PANEL
Anion gap: 5 (ref 5–15)
BUN: 14 mg/dL (ref 6–20)
CO2: 30 mmol/L (ref 22–32)
Calcium: 8.7 mg/dL — ABNORMAL LOW (ref 8.9–10.3)
Chloride: 104 mmol/L (ref 101–111)
Creatinine, Ser: 0.64 mg/dL (ref 0.44–1.00)
Glucose, Bld: 108 mg/dL — ABNORMAL HIGH (ref 65–99)
Potassium: 3.9 mmol/L (ref 3.5–5.1)
Sodium: 139 mmol/L (ref 135–145)

## 2015-03-26 NOTE — Op Note (Signed)
Lindsey Ross, Lindsey Ross NO.:  [id number]  MEDICAL RECORD NO.:  [id number]  LOCATION:  [id number]                        FACILITY:  [facility]  PHYSICIAN:  Vanita Panda. Magnus Ivan, M.D.DATE OF BIRTH:  1925/02/05  DATE OF PROCEDURE:  03/25/2015 DATE OF DISCHARGE:                              OPERATIVE REPORT   PREOPERATIVE DIAGNOSIS:  Primary osteoarthritis and degenerative joint disease, right hip.  POSTOPERATIVE DIAGNOSIS:  Primary osteoarthritis and degenerative joint disease, right hip.  PROCEDURE:  Right total hip arthroplasty, direct anterior approach.  IMPLANTS:  DePuy Sector Gription acetabular component size 54, 1 single screw, size 36+ 0 neutral polyethylene liner, size 12 Corail femoral component with standard offset, size 36- 2 metal hip ball.  SURGEON:  Vanita Panda. Magnus Ivan, M.D.  ASSISTANT:  Richardean Canal, PA-C.  ANESTHESIA:  Spinal.  ANTIBIOTICS:  2 g IV Ancef.  BLOOD LOSS:  Less than 300 mL.  COMPLICATIONS:  None.  INDICATIONS:  Lindsey Ross is an 81 year old patient well known to me. She has severe debilitating arthritis involving her right hip.  She has failed all forms of conservative treatment.  It has gotten to where it affects her mobility and quality of life and her activities of daily living to detrimental effect.  She does wish to proceed with a total hip arthroplasty.  She understands her goals, decreased pain, improved mobility, and overall improved quality of life.  She understands the risk of acute blood loss anemia, nerve and vessel injury, fracture, infection, dislocation, DVT.  Her x-rays show complete loss of her joint space of the right hip, periarticular osteophytes, sclerotic and cystic changes.  PROCEDURE DESCRIPTION:  After informed consent obtained, appropriate right hip was marked.  She was brought to the operating room where spinal anesthesia was obtained while she was on her stretcher.  Traction boots were placed  on both of her feet and a Foley catheter was placed. She was next placed supine on the Hana fracture table with the perineal post in place and both legs in inline skeletal traction devices, but no traction applied.  Her right operative hip was then assessed radiographically.  It  was prepped and draped with DuraPrep and sterile drapes.  Time-out was called, and she was identified as correct patient and correct right hip.  I then made an incision inferior and posterior to the anterior superior iliac spine and carried this obliquely down the leg.  We dissected down the tensor fascia lata muscle and then tensor fascia was then divided longitudinally, so we could proceed with direct anterior approach to the hip.  I identified and cauterized the lateral femoral circumflex vessels and then identified the hip capsule.  I placed a Cobra retractor around the lateral femoral neck and up underneath the rectus femoris around the medial femoral neck.  I then opened up her hip capsule in L-type format finding large joint effusion. I was able to place Cobra retractors within the hip capsule.  I then made my femoral neck cut with the oscillating saw proximal to the lesser trochanter and completed this on osteotome.  I placed a corkscrew guide in the femoral head and removed the femoral head in its entirety.  It was  devoid of cartilage.  We then cleaned the acetabular rim and the acetabular labrum and other debris.  I released the transverse acetabular ligament and placed a bent Hohmann over the medial acetabular rim.  I then began reaming from a size 42 reamer up to a size 54 in 2 mm increments with all reamers under direct visualization, the last reamer also on direct fluoroscopy, so I could obtain my depth of reaming, my inclination, and anteversion.  Once I was pleased with this, I placed a real DePuy Sector Gription acetabular component size 54, and a single screw given some of her sclerotic bone.  I  was very pleased with the nature of this.  I then placed the real 36+ 0 neutral polyethylene liner for a size 54 acetabular component.  Attention was then turned to the femur.  With the leg externally rotated to about 100 degrees extended and adducted, we were able to place a Mueller retractor medially and a Hohmann retractor behind the greater trochanter.  I released the lateral joint capsule and then used a box cutting osteotome to enter the femoral canal and a rongeur to lateralize.  I then began broaching from a size 8 broach using Corail broaching system up to a size 12.  With size 12, we had a nice fit.  To this, I used a calcar planer and then trialed a standard neck.  Due to the fact that my neck cut was a little bit high, we trialed a 36- 2 hip ball and reduced this in acetabulum and after measurement of offsets and leg lengths, we felt like they were very close to equal and it was definitely stable on exam with internal and external rotation, flexion and extension.  We then dislocated the hip and removed the trial components.  We then placed a real Corail femoral component size 12 with standard offset and the real 36- 2 metal hip ball, reduced this back in the acetabulum, again it was stable.  We then copiously irrigated the soft tissue with normal saline solution.  We closed the joint capsule with interrupted #1 Ethibond suture followed by a running #1 Vicryl in the tensor fascia, 0 Vicryl in the deep tissue, 2- 0 Vicryl in subcutaneous tissue, and interrupted staples on the skin. Xeroform and an Aquacel dressing was applied.  She was then taken off the Hana table and taken to the recovery room in stable condition.  All final counts were correct.  There were no complications noted.  Of note, Richardean Canal, PA-C, is assistant, he was crucial throughout all aspects of this case and was definitely necessary.     Vanita Panda. Magnus Ivan, M.D.     CYB/MEDQ  D:  03/25/2015   T:  03/26/2015  Job:  [id number]

## 2015-03-26 NOTE — Progress Notes (Signed)
Physical Therapy Treatment Patient Details Name: Lindsey Ross MRN: [id number] DOB: Jan 31, 1925 Today's Date: 03/26/2015    History of Present Illness Pt is a 81 y/o F s/p R THA.  Pt's PMH includes HTN, SOB on exertion, frequent urination, R ankle fx surgery.    PT Comments    Lindsey Ross is making good progress w/ PT; however still requires min guard assist during ambulation 2/2 LOB x1 this session.  Pt remains a fall risk and continue to recommend d/c to SNF.  Pt will benefit from continued skilled PT services to increase functional independence and safety.   Follow Up Recommendations  SNF     Equipment Recommendations  Other (comment) (TBD)    Recommendations for Other Services       Precautions / Restrictions Precautions Precautions: Fall Precaution Comments: direct ant, no hip precautions Restrictions Weight Bearing Restrictions: Yes RLE Weight Bearing: Weight bearing as tolerated    Mobility  Bed Mobility Overal bed mobility: Needs Assistance Bed Mobility: Supine to Sit;Sit to Supine     Supine to sit: Min assist;HOB elevated Sit to supine: Min assist   General bed mobility comments: Min assist managing RLE to EOB.  HOB elevated and pt uses bed rail. Min assist managing RLE to return to supine in bed after ambulation.  Transfers Overall transfer level: Needs assistance Equipment used: Rolling walker (2 wheeled) Transfers: Sit to/from Stand Sit to Stand: Supervision         General transfer comment: Supervision for safety.  Pt w/ good technique and did not require any cues or physical assist  Ambulation/Gait Ambulation/Gait assistance: Min guard Ambulation Distance (Feet): 200 Feet Assistive device: Rolling walker (2 wheeled) Gait Pattern/deviations: Step-to pattern;Step-through pattern;Antalgic;Decreased weight shift to right;Decreased stride length;Decreased stance time - right Gait velocity: 0.77 ft/s Gait velocity interpretation: <1.8 ft/sec,  indicative of risk for recurrent falls General Gait Details: Cues for heel strike which prompted step through gait pattern.  Pt w/ LOB x1 during ambulation but was able to maintain balance w/ use of RW.   Stairs            Wheelchair Mobility    Modified Rankin (Stroke Patients Only)       Balance Overall balance assessment: Needs assistance Sitting-balance support: No upper extremity supported;Feet supported Sitting balance-Leahy Scale: Good     Standing balance support: Bilateral upper extremity supported;During functional activity Standing balance-Leahy Scale: Fair Standing balance comment: Pt again able to stand at sink and wash hands w/o either UE supported                    Cognition Arousal/Alertness: Awake/alert Behavior During Therapy: WFL for tasks assessed/performed Overall Cognitive Status: Within Functional Limits for tasks assessed                      Exercises Total Joint Exercises Ankle Circles/Pumps: AROM;Both;10 reps;Supine Quad Sets: AROM;Both;10 reps;Supine Gluteal Sets: Strengthening;Both;10 reps;Supine Long Arc Quad: AROM;Both;10 reps;Seated Knee Flexion: AROM;Right;10 reps;Standing    General Comments        Pertinent Vitals/Pain Pain Assessment: 0-10 Pain Score: 5  Pain Location: R hip Pain Descriptors / Indicators: Aching;Constant Pain Intervention(s): Limited activity within patient's tolerance;Monitored during session;Repositioned    Home Living                      Prior Function            PT Goals (current goals can now  be found in the care plan section) Acute Rehab PT Goals Patient Stated Goal: to get stronger so she can go to rehab before home PT Goal Formulation: With patient/family Time For Goal Achievement: 04/02/15 Potential to Achieve Goals: Good Progress towards PT goals: Progressing toward goals    Frequency  7X/week    PT Plan Current plan remains appropriate    Co-evaluation              End of Session Equipment Utilized During Treatment: Gait belt Activity Tolerance: Patient limited by fatigue Patient left: with call bell/phone within reach;with family/visitor present;in bed     Time: 1610-9604 PT Time Calculation (min) (ACUTE ONLY): 19 min  Charges:  $Gait Training: 8-22 mins                    G Codes:      Michail Jewels PT, [place] [phone] Pager: [phone] 03/26/2015, 4:46 PM

## 2015-03-26 NOTE — Progress Notes (Signed)
Subjective: 1 Day Post-Op Procedure(s) (LRB): RIGHT TOTAL HIP ARTHROPLASTY ANTERIOR APPROACH (Right) Patient reports pain as moderate.  Labs not back yet.  Objective: Vital signs in last 24 hours: Temp:  [97.5 F (36.4 C)-98.9 F (37.2 C)] 98.9 F (37.2 C) (08/03 0450) Pulse Rate:  [71-88] 73 (08/03 0450) Resp:  [11-20] 16 (08/03 0450) BP: (107-171)/(56-97) 107/56 mmHg (08/03 0450) SpO2:  [96 %-100 %] 99 % (08/03 0450) Weight:  [82.101 kg (181 lb)] 82.101 kg (181 lb) (08/02 1101)  Intake/Output from previous day: 08/02 0701 - 08/03 0700 In: 1340 [P.O.:240; I.V.:1100] Out: 1450 [Urine:1350; Blood:100] Intake/Output this shift: Total I/O In: -  Out: 200 [Urine:200]  No results for input(s): HGB in the last 72 hours. No results for input(s): WBC, RBC, HCT, PLT in the last 72 hours. No results for input(s): NA, K, CL, CO2, BUN, CREATININE, GLUCOSE, CALCIUM in the last 72 hours. No results for input(s): LABPT, INR in the last 72 hours.  Sensation intact distally Intact pulses distally Dorsiflexion/Plantar flexion intact Incision: dressing C/D/I  Assessment/Plan: 1 Day Post-Op Procedure(s) (LRB): RIGHT TOTAL HIP ARTHROPLASTY ANTERIOR APPROACH (Right) Up with therapy  Nannette Zill Y 03/26/2015, 7:18 AM

## 2015-03-26 NOTE — Clinical Social Work Placement (Addendum)
   CLINICAL SOCIAL WORK PLACEMENT  NOTE  Date:  03/26/2015  Patient Details  Name: DIVA LEMBERGER MRN: [id number] Date of Birth: 1924/10/01  Clinical Social Work is seeking post-discharge placement for this patient at the Skilled  Nursing Facility level of care (*CSW will initial, date and re-position this form in  chart as items are completed):  Yes   Patient/family provided with [facility] Clinical Social Work Department's list of facilities offering this level of care within the geographic area requested by the patient (or if unable, by the patient's family).  Yes   Patient/family informed of their freedom to choose among providers that offer the needed level of care, that participate in Medicare, Medicaid or managed care program needed by the patient, have an available bed and are willing to accept the patient.  Yes   Patient/family informed of [facility]'s ownership interest in [facility] and [facility], as well as of the fact that they are under no obligation to receive care at these facilities.  PASRR submitted to EDS on 03/26/15     PASRR number received on 03/26/15     Existing PASRR number confirmed on       FL2 transmitted to all facilities in geographic area requested by pt/family on 03/26/15     FL2 transmitted to all facilities within larger geographic area on       Patient informed that his/her managed care company has contracts with or will negotiate with certain facilities, including the following:        Yes   Patient/family informed of bed offers received. 03/27/2015  Patient chooses bed at     [facility]  Physician recommends and patient chooses bed at     [facility] Patient to be transferred to   on  . 03/29/15  Patient to be transferred to facility by       Patient family notified on   of transfer.  Name of family member notified:        PHYSICIAN Please prepare prescriptions, Please sign FL2, Please prepare priority discharge summary,  including medications     Additional Comment:    _______________________________________________ Raye Sorrow, LCSW 03/26/2015, 2:52 PM

## 2015-03-26 NOTE — Progress Notes (Signed)
OT Cancellation Note  Patient Details Name: Lindsey Ross MRN: [id number] DOB: [birth year]   Cancelled Treatment:    Reason Eval/Treat Not Completed: OT screened, no needs identified, will sign off. Plan is for patient to discharge > SNF. No acute OT needs identified, all needs can be met in SNF. Please send text page to OT services if any questions, concerns, or with new orders: [phone] OR call office at [phone]. Thank you for the order.    Danell Verno , MS, OTR/L, CLT Pager: [phone]  03/26/2015, 1:16 PM

## 2015-03-26 NOTE — Evaluation (Signed)
Physical Therapy Evaluation Patient Details Name: Lindsey Ross MRN: [id number] DOB: 02/04/1925 Today's Date: 03/26/2015   History of Present Illness  Pt is a 81 y/o F s/p R THA.  Pt's PMH includes HTN, SOB on exertion, frequent urination, R ankle fx surgery.  Clinical Impression  Pt is s/p R THA resulting in the deficits listed below (see PT Problem List). Lindsey Ross lives at home w/ husband who will not be available to provide assist upon d/c, therefore pt will need to go to SNF to ensure safety.  Ambulated in hallway this session. Pt will benefit from skilled PT to increase their independence and safety with mobility to allow discharge to the venue listed below.     Follow Up Recommendations SNF    Equipment Recommendations  Other (comment) (TBD)    Recommendations for Other Services       Precautions / Restrictions Precautions Precautions: Fall Precaution Comments: direct ant, no hip precautions Restrictions Weight Bearing Restrictions: Yes RLE Weight Bearing: Weight bearing as tolerated      Mobility  Bed Mobility Overal bed mobility: Needs Assistance Bed Mobility: Supine to Sit     Supine to sit: Min assist;HOB elevated     General bed mobility comments: Min assist managing RLE to EOB.  HOB elevated and pt uses bed rail.  Increased time and cues for technique.  Transfers Overall transfer level: Needs assistance Equipment used: Rolling walker (2 wheeled) Transfers: Sit to/from Stand Sit to Stand: Min assist         General transfer comment: Min assist to power up to standing.  Cues for hand placement and proper technique.  Ambulation/Gait Ambulation/Gait assistance: Min guard Ambulation Distance (Feet): 50 Feet Assistive device: Rolling walker (2 wheeled) Gait Pattern/deviations: Step-to pattern;Antalgic;Trunk flexed;Decreased weight shift to right;Decreased stride length;Decreased stance time - right   Gait velocity interpretation: Below normal  speed for age/gender General Gait Details: Trunk slightly flexed.  Cues for proper management of RW.  Pt c/o dizziness and hyperthermia and requests to sit after ambulating 50 ft.  These sx dissipate after sitting in recliner for 2 minutes.  Stairs            Wheelchair Mobility    Modified Rankin (Stroke Patients Only)       Balance Overall balance assessment: Needs assistance Sitting-balance support: Bilateral upper extremity supported;Feet supported Sitting balance-Leahy Scale: Good     Standing balance support: Bilateral upper extremity supported;During functional activity Standing balance-Leahy Scale: Fair Standing balance comment: Pt able to stand at sink and wash hands w/o either UE supported                             Pertinent Vitals/Pain Pain Assessment: 0-10 Pain Score: 8  Pain Location: R hip Pain Descriptors / Indicators: Grimacing;Guarding;Nagging Pain Intervention(s): Limited activity within patient's tolerance;Monitored during session;Repositioned;Ice applied    Home Living Family/patient expects to be discharged to:: Skilled nursing facility Living Arrangements: Spouse/significant other               Additional Comments: Pt lives alone w/ husband who will not be able to provide assist. Husband uses cane.    Prior Function Level of Independence: Independent               Hand Dominance        Extremity/Trunk Assessment   Upper Extremity Assessment: Overall WFL for tasks assessed  Lower Extremity Assessment: RLE deficits/detail RLE Deficits / Details: weakness and limited ROM s/p R THA    Cervical / Trunk Assessment: Kyphotic  Communication   Communication: No difficulties  Cognition Arousal/Alertness: Awake/alert Behavior During Therapy: Anxious Overall Cognitive Status: Within Functional Limits for tasks assessed                      General Comments      Exercises Total Joint  Exercises Ankle Circles/Pumps: AROM;Both;10 reps;Supine Gluteal Sets: Strengthening;Both;10 reps;Supine Heel Slides: AROM;Both;5 reps;Supine      Assessment/Plan    PT Assessment Patient needs continued PT services  PT Diagnosis Difficulty walking;Abnormality of gait;Generalized weakness;Acute pain   PT Problem List Decreased strength;Decreased range of motion;Decreased activity tolerance;Decreased balance;Decreased mobility;Decreased coordination;Decreased knowledge of use of DME;Decreased safety awareness;Decreased knowledge of precautions;Pain;Decreased skin integrity  PT Treatment Interventions DME instruction;Gait training;Stair training;Functional mobility training;Therapeutic activities;Therapeutic exercise;Balance training;Neuromuscular re-education;Patient/family education;Modalities   PT Goals (Current goals can be found in the Care Plan section) Acute Rehab PT Goals Patient Stated Goal: to get stronger so she can go to rehab before home PT Goal Formulation: With patient/family Time For Goal Achievement: 04/02/15 Potential to Achieve Goals: Good    Frequency 7X/week   Barriers to discharge Decreased caregiver support lives at home w/ husband who will not able to provide assist upon d/c    Co-evaluation               End of Session Equipment Utilized During Treatment: Gait belt Activity Tolerance: Patient limited by pain;Patient limited by fatigue Patient left: in chair;with call bell/phone within reach;with family/visitor present Nurse Communication: Mobility status;Precautions;Weight bearing status         Time: 1610-9604 PT Time Calculation (min) (ACUTE ONLY): 33 min   Charges:   PT Evaluation $Initial PT Evaluation Tier I: 1 Procedure PT Treatments $Gait Training: 8-22 mins   PT G CodesMichail Jewels PT, DPT [phone] Pager: [phone] 03/26/2015, 11:55 AM

## 2015-03-26 NOTE — Clinical Social Work Note (Signed)
Clinical Social Work Assessment  Patient Details  Name: Lindsey Ross MRN: [id number] Date of Birth: 1925/07/12  Date of referral:  03/26/15               Reason for consult:  Facility Placement                Permission sought to share information with:  Case Manager, Customer service manager, Family Supports Permission granted to share information::  Yes, Verbal Permission Granted  Name::        Agency::  [facility]  Relationship::  Daughters: Radiographer, therapeutic Information:     Housing/Transportation Living arrangements for the past 2 months:  Single Family Home Source of Information:  Patient, Adult Children Patient Interpreter Needed:  None Criminal Activity/Legal Involvement Pertinent to Current Situation/Hospitalization:  No - Comment as needed Significant Relationships:  Adult Children, Spouse Lives with:  Spouse Do you feel safe going back to the place where you live?  No (limited care at home as husband is unable to help with patient at this time) Need for family participation in patient care:  Yes (Comment) (per request)  Care giving concerns:  Patient reports she lives just her husband at home and do to his own limited physical ability, he would be unable to provide adequate and safe care, thus requesting SNF at DC.  Patient living independently prior to admission   Social Worker assessment / plan:  LCSW met with patient, husband and daughter at the bedside. Patient very cooperative and in agreement with treatment plan to DC to SNF once medically stable ?Friday. Patient reports she lives with husband, will need more help then he is able to give and wants to [facility]. LCSW explained role and process of placement procedure and obtained verbal permission.  Will follow up with bed offers. Patient report she has been to [facility] about 13 years ago, but never to SNF.  Motivated for fast recovery in effort to return home. Passar to be completed, FL2 completed  and Bed offers to come with referrals being sent out.  Employment status:  Retired Advertising copywriter, [employer] PT Recommendations:  [place] / Referral to community resources:  [place]  Patient/Family's Response to care:  Agreeable to SNF at DC  Patient/Family's Understanding of and Emotional Response to Diagnosis, Current Treatment, and Prognosis:  Patient undertanding of current limitations and safety concerns regarding dc planning and SNF referral showing evidence of cooperation and understanding of plan and improved prognosis with SNF.  Emotional Assessment Appearance:  Appears stated age Attitude/Demeanor/Rapport:  Other (Motivated and Cooperative) Affect (typically observed):  Accepting, Hopeful Orientation:  Oriented to Self, Oriented to Place, Oriented to  Time, Oriented to Situation Alcohol / Substance use:  Not Applicable Psych involvement (Current and /or in the community):  No (Comment)  Discharge Needs  Concerns to be addressed:  Denies Needs/Concerns at this time Readmission within the last 30 days:  No Current discharge risk:  None Barriers to Discharge:  No Barriers Identified, Continued Medical Work up   [place], LCSW 03/26/2015, 2:48 PM

## 2015-03-27 LAB — CBC
HCT: 33.5 % — ABNORMAL LOW (ref 36.0–46.0)
Hemoglobin: 10.4 g/dL — ABNORMAL LOW (ref 12.0–15.0)
MCH: 26.5 pg (ref 26.0–34.0)
MCHC: 31 g/dL (ref 30.0–36.0)
MCV: 85.5 fL (ref 78.0–100.0)
Platelets: 197 10*3/uL (ref 150–400)
RBC: 3.92 MIL/uL (ref 3.87–5.11)
RDW: 14.9 % (ref 11.5–15.5)
WBC: 9.3 10*3/uL (ref 4.0–10.5)

## 2015-03-27 MED ORDER — METHOCARBAMOL 500 MG PO TABS: 500.0000 mg | ORAL_TABLET | Freq: Four times a day (QID) | ORAL | Status: DC | PRN

## 2015-03-27 MED ORDER — HYDROCODONE-ACETAMINOPHEN 5-325 MG PO TABS: 1.0000 | ORAL_TABLET | ORAL | Status: DC | PRN

## 2015-03-27 MED ORDER — ASPIRIN 325 MG PO TBEC: 325.0000 mg | DELAYED_RELEASE_TABLET | Freq: Two times a day (BID) | ORAL | Status: DC

## 2015-03-27 NOTE — Progress Notes (Signed)
Subjective: 2 Days Post-Op Procedure(s) (LRB): RIGHT TOTAL HIP ARTHROPLASTY ANTERIOR APPROACH (Right) Patient reports pain as moderate.  Asymptomatic acute blood loss anemia.  Working well with therapy.  Objective: Vital signs in last 24 hours: Temp:  [98.2 F (36.8 C)-99 F (37.2 C)] 98.2 F (36.8 C) (08/04 0518) Pulse Rate:  [72-89] 77 (08/04 0518) Resp:  [18] 18 (08/04 0518) BP: (94-128)/(52-65) 128/54 mmHg (08/04 0836) SpO2:  [94 %-100 %] 94 % (08/04 0518)  Intake/Output from previous day: 08/03 0701 - 08/04 0700 In: 240 [P.O.:240] Out: 200 [Urine:200] Intake/Output this shift: Total I/O In: 240 [P.O.:240] Out: -    Recent Labs  03/26/15 0600 03/27/15 0433  HGB 11.1* 10.4*    Recent Labs  03/26/15 0600 03/27/15 0433  WBC 6.0 9.3  RBC 4.13 3.92  HCT 35.2* 33.5*  PLT 193 197    Recent Labs  03/26/15 0600  NA 139  K 3.9  CL 104  CO2 30  BUN 14  CREATININE 0.64  GLUCOSE 108*  CALCIUM 8.7*   No results for input(s): LABPT, INR in the last 72 hours.  Sensation intact distally Intact pulses distally Dorsiflexion/Plantar flexion intact Incision: scant drainage  Assessment/Plan: 2 Days Post-Op Procedure(s) (LRB): RIGHT TOTAL HIP ARTHROPLASTY ANTERIOR APPROACH (Right) Up with therapy Discharge to SNF tomorrow 8/5  Aislyn Hayse Y 03/27/2015, 11:25 AM

## 2015-03-27 NOTE — Discharge Instructions (Signed)

## 2015-03-27 NOTE — Progress Notes (Signed)
Patient has been accepted to [facility] for Skilled Nursing Home Placement Family in room and patient made aware of plan and agreeable. Updated Jasmine December at [facility] who is also in agreement.  Regina from SNF to come by and see patient today to complete admission paperwork. Plan for DC if medically stable 03/28/15. FL2 on chart.  MD please sign!  Deretha Emory, MSW Clinical Social Work: Emergency Room [phone]

## 2015-03-27 NOTE — Progress Notes (Signed)
Physical Therapy Treatment Patient Details Name: Lindsey Ross MRN: [id number] DOB: [birth year] Today's Date: 03/27/2015    History of Present Illness Pt is a 81 y/o F s/p R THA.  Pt's PMH includes HTN, SOB on exertion, frequent urination, R ankle fx surgery.    PT Comments    Pt is making good progress w/ PT and completed standing therapeutic exercises this session.  [name] reports some fatigue after ambulating in hallway and remains appropriate for SNF upon d/c as she lives alone w/ her husband who is unable to provide assist.  Pt will benefit from continued skilled PT services to increase functional independence and safety.   Follow Up Recommendations  SNF     Equipment Recommendations  Other (comment) (TBD)    Recommendations for Other Services       Precautions / Restrictions Precautions Precautions: Fall Precaution Comments: direct ant, no hip precautions Restrictions Weight Bearing Restrictions: Yes RLE Weight Bearing: Weight bearing as tolerated    Mobility  Bed Mobility Overal bed mobility: Needs Assistance Bed Mobility: Supine to Sit     Supine to sit: HOB elevated;Min guard     General bed mobility comments: Min guard w/ increased time and use of bed rail.  HOB elevated.  Pt w/ good technique.  Transfers Overall transfer level: Needs assistance Equipment used: Rolling walker (2 wheeled) Transfers: Sit to/from Stand Sit to Stand: Supervision         General transfer comment: Supervision for safety.  Pt w/ good technique and did not require any cues or physical assist  Ambulation/Gait Ambulation/Gait assistance: Min guard Ambulation Distance (Feet): 200 Feet Assistive device: Rolling walker (2 wheeled) Gait Pattern/deviations: Step-through pattern;Antalgic;Trunk flexed;Decreased weight shift to right;Decreased stride length   Gait velocity interpretation: <1.8 ft/sec, indicative of risk for recurrent falls General Gait Details: Pt w/ good  carryover with heel strike technique to promote step through gait pattern.  Cues to relax shoulders and progressively WB more through BLEs to offload UEs as she c/o shoulder achiness.     Stairs            Wheelchair Mobility    Modified Rankin (Stroke Patients Only)       Balance Overall balance assessment: Needs assistance Sitting-balance support: No upper extremity supported;Feet supported Sitting balance-Leahy Scale: Good     Standing balance support: During functional activity;No upper extremity supported Standing balance-Leahy Scale: Fair Standing balance comment: Pt able to stand w/o either UE supported for pericare after using commode                    Cognition Arousal/Alertness: Awake/alert Behavior During Therapy: WFL for tasks assessed/performed Overall Cognitive Status: Within Functional Limits for tasks assessed                      Exercises Total Joint Exercises Gluteal Sets: Strengthening;Both;10 reps;Seated Long Arc Quad: Both;10 reps;Seated;Strengthening (w/ 5 second hold) Standing Hip Extension: Strengthening;Right;10 reps;Standing General Exercises - Lower Extremity Mini-Sqauts: Strengthening;10 reps;Standing    General Comments        Pertinent Vitals/Pain Pain Assessment: 0-10 Pain Score: 5  Pain Location: R hip Pain Descriptors / Indicators: Sore Pain Intervention(s): Limited activity within patient's tolerance;Monitored during session;Repositioned    Home Living                      Prior Function            PT Goals (current  goals can now be found in the care plan section) Acute Rehab PT Goals Patient Stated Goal: to get stronger so she can go to rehab before home PT Goal Formulation: With patient/family Time For Goal Achievement: 04/02/15 Potential to Achieve Goals: Good Progress towards PT goals: Progressing toward goals    Frequency  7X/week    PT Plan Current plan remains appropriate     Co-evaluation             End of Session Equipment Utilized During Treatment: Gait belt Activity Tolerance: Patient limited by fatigue Patient left: with call bell/phone within reach;with family/visitor present;in chair     Time: 6578-4696 PT Time Calculation (min) (ACUTE ONLY): 25 min  Charges:  $Gait Training: 8-22 mins $Therapeutic Exercise: 8-22 mins                    G Codes:      Michail Jewels PT, [place] [phone] Pager: [phone] 03/27/2015, 1:57 PM

## 2015-03-27 NOTE — Progress Notes (Signed)
Physical Therapy Treatment Patient Details Name: Lindsey Ross MRN: [id number] DOB: [birth year] Today's Date: 03/27/2015    History of Present Illness Pt is a 81 y/o F s/p R THA.  Pt's PMH includes HTN, SOB on exertion, frequent urination, R ankle fx surgery.    PT Comments    Pt continues to progress well w/ PT but still requires min guard assist for ambulation.  Mrs. Bir completed therapeutic exercises in standing and ambulated in hallway. Pt will benefit from continued skilled PT services to increase functional independence and safety.   Follow Up Recommendations  SNF     Equipment Recommendations  Other (comment) (TBD)    Recommendations for Other Services       Precautions / Restrictions Precautions Precautions: Fall Precaution Comments: direct ant, no hip precautions Restrictions Weight Bearing Restrictions: Yes RLE Weight Bearing: Weight bearing as tolerated    Mobility  Bed Mobility Overal bed mobility: Needs Assistance Bed Mobility: Supine to Sit     Supine to sit: HOB elevated;Min guard     General bed mobility comments: In recliner upon PT arrival  Transfers Overall transfer level: Needs assistance Equipment used: Rolling walker (2 wheeled) Transfers: Sit to/from Stand Sit to Stand: Supervision         General transfer comment: Supervision for safety.  Pt w/ good technique and did not require any cues or physical assist  Ambulation/Gait Ambulation/Gait assistance: Min guard Ambulation Distance (Feet): 210 Feet Assistive device: Rolling walker (2 wheeled) Gait Pattern/deviations: Step-through pattern;Antalgic;Decreased stance time - right   Gait velocity interpretation: <1.8 ft/sec, indicative of risk for recurrent falls General Gait Details: Again, pt w/ good carryover with heel strike technique to promote step through gait pattern.  Pt ambulates w/ shoulder relaxed this session.   Stairs            Wheelchair Mobility     Modified Rankin (Stroke Patients Only)       Balance Overall balance assessment: Needs assistance Sitting-balance support: No upper extremity supported;Feet supported Sitting balance-Leahy Scale: Good     Standing balance support: No upper extremity supported;During functional activity Standing balance-Leahy Scale: Fair Standing balance comment: Pt able to stand by counter and place washcloth on neck w/o either UE supported                    Cognition Arousal/Alertness: Awake/alert Behavior During Therapy: WFL for tasks assessed/performed Overall Cognitive Status: Within Functional Limits for tasks assessed                      Exercises Total Joint Exercises Gluteal Sets: Strengthening;Both;10 reps;Seated Long Arc Quad:  (w/ 5 second hold) Knee Flexion: Strengthening;Right;10 reps;Standing Standing Hip Extension: Strengthening;Right;10 reps;Standing General Exercises - Lower Extremity Mini-Sqauts: Strengthening;10 reps;Standing    General Comments        Pertinent Vitals/Pain Pain Assessment: 0-10 Pain Score: 5  Pain Location: R hip Pain Descriptors / Indicators: Dull;Sore Pain Intervention(s): Limited activity within patient's tolerance;Monitored during session;Repositioned    Home Living                      Prior Function            PT Goals (current goals can now be found in the care plan section) Acute Rehab PT Goals Patient Stated Goal: to get stronger so she can go to rehab before home PT Goal Formulation: With patient/family Time For Goal Achievement: 04/02/15 Potential to Achieve  Goals: Good Progress towards PT goals: Progressing toward goals    Frequency  7X/week    PT Plan Current plan remains appropriate    Co-evaluation             End of Session Equipment Utilized During Treatment: Gait belt Activity Tolerance: Patient limited by fatigue Patient left: with call bell/phone within reach;with  family/visitor present;in chair     Time: 1610-9604 PT Time Calculation (min) (ACUTE ONLY): 17 min  Charges:  $Gait Training: 8-22 mins $Therapeutic Exercise: 8-22 mins                    G Codes:      Michail Jewels PT, [place] [phone] Pager: [phone] 03/27/2015, 4:46 PM

## 2015-03-27 NOTE — Care Management Important Message (Signed)
Important Message  Patient Details  Name: Lindsey Ross MRN: [id number] Date of Birth: 04/06/1925   Medicare Important Message Given:  Yes-second notification given    Orson Aloe 03/27/2015, 2:24 PM

## 2015-03-28 DIAGNOSIS — R278 Other lack of coordination: Secondary | ICD-10-CM | POA: Diagnosis not present

## 2015-03-28 DIAGNOSIS — I10 Essential (primary) hypertension: Secondary | ICD-10-CM | POA: Diagnosis not present

## 2015-03-28 DIAGNOSIS — M25551 Pain in right hip: Secondary | ICD-10-CM | POA: Diagnosis not present

## 2015-03-28 DIAGNOSIS — K5901 Slow transit constipation: Secondary | ICD-10-CM | POA: Diagnosis not present

## 2015-03-28 DIAGNOSIS — D62 Acute posthemorrhagic anemia: Secondary | ICD-10-CM | POA: Diagnosis not present

## 2015-03-28 DIAGNOSIS — Z471 Aftercare following joint replacement surgery: Secondary | ICD-10-CM | POA: Diagnosis not present

## 2015-03-28 DIAGNOSIS — R2681 Unsteadiness on feet: Secondary | ICD-10-CM | POA: Diagnosis not present

## 2015-03-28 DIAGNOSIS — M1611 Unilateral primary osteoarthritis, right hip: Secondary | ICD-10-CM | POA: Diagnosis not present

## 2015-03-28 DIAGNOSIS — Z96641 Presence of right artificial hip joint: Secondary | ICD-10-CM | POA: Diagnosis not present

## 2015-03-28 DIAGNOSIS — K219 Gastro-esophageal reflux disease without esophagitis: Secondary | ICD-10-CM | POA: Diagnosis not present

## 2015-03-28 DIAGNOSIS — M6281 Muscle weakness (generalized): Secondary | ICD-10-CM | POA: Diagnosis not present

## 2015-03-28 DIAGNOSIS — Z96649 Presence of unspecified artificial hip joint: Secondary | ICD-10-CM | POA: Diagnosis not present

## 2015-03-28 NOTE — Discharge Planning (Signed)
Report called to [place] at [facility].

## 2015-03-28 NOTE — Clinical Social Work Placement (Signed)
   CLINICAL SOCIAL WORK PLACEMENT  NOTE  Date:  03/28/2015  Patient Details  Name: Lindsey Ross MRN: [id number] Date of Birth: Jan 05, 1925  Clinical Social Work is seeking post-discharge placement for this patient at the Skilled  Nursing Facility level of care (*CSW will initial, date and re-position this form in  chart as items are completed):  Yes   Patient/family provided with [facility] Clinical Social Work Department's list of facilities offering this level of care within the geographic area requested by the patient (or if unable, by the patient's family).  Yes   Patient/family informed of their freedom to choose among providers that offer the needed level of care, that participate in Medicare, Medicaid or managed care program needed by the patient, have an available bed and are willing to accept the patient.  Yes   Patient/family informed of [place] [facility]'s ownership interest in [facility] and [facility], as well as of the fact that they are under no obligation to receive care at these facilities.  PASRR submitted to EDS on 03/26/15     PASRR number received on 03/26/15     Existing PASRR number confirmed on       FL2 transmitted to all facilities in geographic area requested by pt/family on 03/26/15     FL2 transmitted to all facilities within larger geographic area on       Patient informed that his/her managed care company has contracts with or will negotiate with certain facilities, including the following:        Yes   Patient/family informed of bed offers received.  Patient chooses bed at [facility]     Physician recommends and patient chooses bed at      Patient to be transferred to [facility] on 03/28/15.  Patient to be transferred to facility by [facility]     Patient family notified on 03/28/15 of transfer.  Name of family member notified:  Maisie Fus, husband     PHYSICIAN Please prepare prescriptions, Please sign FL2, Please prepare priority  discharge summary, including medications     Additional Comment:    _______________________________________________ Rondel Baton, LCSW 03/28/2015, 10:11 AM

## 2015-03-28 NOTE — Progress Notes (Signed)
Subjective: 3 Days Post-Op Procedure(s) (LRB): RIGHT TOTAL HIP ARTHROPLASTY ANTERIOR APPROACH (Right) Patient reports pain as moderate.  Continues to improve.  Objective: Vital signs in last 24 hours: Temp:  [97.6 F (36.4 C)-98.6 F (37 C)] 98 F (36.7 C) (08/05 0521) Pulse Rate:  [77-85] 80 (08/05 0521) Resp:  [16-18] 16 (08/05 0521) BP: (101-128)/(52-77) 101/72 mmHg (08/05 0521) SpO2:  [94 %-96 %] 94 % (08/05 0521)  Intake/Output from previous day: 08/04 0701 - 08/05 0700 In: 240 [P.O.:240] Out: -  Intake/Output this shift:     Recent Labs  03/26/15 0600 03/27/15 0433  HGB 11.1* 10.4*    Recent Labs  03/26/15 0600 03/27/15 0433  WBC 6.0 9.3  RBC 4.13 3.92  HCT 35.2* 33.5*  PLT 193 197    Recent Labs  03/26/15 0600  NA 139  K 3.9  CL 104  CO2 30  BUN 14  CREATININE 0.64  GLUCOSE 108*  CALCIUM 8.7*   No results for input(s): LABPT, INR in the last 72 hours.  Intact pulses distally Dorsiflexion/Plantar flexion intact Incision: scant drainage No cellulitis present Compartment soft  Assessment/Plan: 3 Days Post-Op Procedure(s) (LRB): RIGHT TOTAL HIP ARTHROPLASTY ANTERIOR APPROACH (Right) Discharge to SNF today.  Izick Gasbarro Y 03/28/2015, 6:52 AM

## 2015-03-28 NOTE — Discharge Planning (Addendum)
Patient will discharge today per MD order. Patient will discharge to: [facility] SNF RN to call report prior to transportation to: [phone] Transportation: family- facility is ready for patient.  RN can send anytime  CSW sent discharge summary to SNF for review.  Packet is complete.  RN, patient and family aware of discharge plans.  Vickii Penna, LCSWA [phone]  Psychiatric & Orthopedics (5N 1-16) Clinical Social Worker

## 2015-03-28 NOTE — Discharge Summary (Signed)
Patient ID: Lindsey Ross MRN: [id number] DOB/AGE: 1925/02/23 81 y.o.  Admit date: 03/25/2015 Discharge date: 03/28/2015  Admission Diagnoses:  Principal Problem:   Osteoarthritis of right hip Active Problems:   Status post total replacement of right hip   Discharge Diagnoses:  Same  Past Medical History  Diagnosis Date  . Hypertension   . Shortness of breath dyspnea     with exertion  . Frequent urination   . Arthritis   . Burning sensation of feet     Bilateral feet    Surgeries: Procedure(s): RIGHT TOTAL HIP ARTHROPLASTY ANTERIOR APPROACH on 03/25/2015   Consultants:    Discharged Condition: Improved  Hospital Course: Lindsey Ross is an 81 y.o. female who was admitted 03/25/2015 for operative treatment ofOsteoarthritis of right hip. Patient has severe unremitting pain that affects sleep, daily activities, and work/hobbies. After pre-op clearance the patient was taken to the operating room on 03/25/2015 and underwent  Procedure(s): RIGHT TOTAL HIP ARTHROPLASTY ANTERIOR APPROACH.    Patient was given perioperative antibiotics: Anti-infectives    Start     Dose/Rate Route Frequency Ordered Stop   03/25/15 1800  ceFAZolin (ANCEF) IVPB 1 g/50 mL premix     1 g 100 mL/hr over 30 Minutes Intravenous Every 6 hours 03/25/15 1755 03/25/15 2340   03/25/15 1215  ceFAZolin (ANCEF) IVPB 2 g/50 mL premix     2 g 100 mL/hr over 30 Minutes Intravenous To [facility] 03/24/15 1252 03/25/15 1247       Patient was given sequential compression devices, early ambulation, and chemoprophylaxis to prevent DVT.  Patient benefited maximally from hospital stay and there were no complications.    Recent vital signs: Patient Vitals for the past 24 hrs:  BP Temp Temp src Pulse Resp SpO2  03/28/15 0521 101/72 mmHg 98 F (36.7 C) Oral 80 16 94 %  03/27/15 2037 (!) 122/52 mmHg 97.6 F (36.4 C) Oral 77 18 95 %  03/27/15 1504 103/77 mmHg 98.6 F (37 C) Oral 85 16 96 %  03/27/15 0836  (!) 128/54 mmHg - - - - -     Recent laboratory studies:  Recent Labs  03/26/15 0600 03/27/15 0433  WBC 6.0 9.3  HGB 11.1* 10.4*  HCT 35.2* 33.5*  PLT 193 197  NA 139  --   K 3.9  --   CL 104  --   CO2 30  --   BUN 14  --   CREATININE 0.64  --   GLUCOSE 108*  --   CALCIUM 8.7*  --      Discharge Medications:     Medication List    STOP taking these medications        aspirin 81 MG tablet  Replaced by:  aspirin 325 MG EC tablet      TAKE these medications        acetaminophen 325 MG tablet  Commonly known as:  TYLENOL  Take 650 mg by mouth every 8 (eight) hours as needed for mild pain.     ALEVE 220 MG tablet  Generic drug:  naproxen sodium  Take 440 mg by mouth 2 (two) times daily as needed (pain).     aspirin 325 MG EC tablet  Take 1 tablet (325 mg total) by mouth 2 (two) times daily after a meal.     Fish Oil 1000 MG Caps  Take 1 capsule by mouth daily.     HYDROcodone-acetaminophen 5-325 MG per tablet  Commonly known as:  NORCO/VICODIN  Take 1-2 tablets by mouth every 4 (four) hours as needed for moderate pain (breakthrough pain).     methocarbamol 500 MG tablet  Commonly known as:  ROBAXIN  Take 1 tablet (500 mg total) by mouth every 6 (six) hours as needed for muscle spasms.     multivitamin with minerals tablet  Take 1 tablet by mouth daily.     olmesartan-hydrochlorothiazide 40-25 MG per tablet  Commonly known as:  BENICAR HCT  Take 1 tablet by mouth daily.     oyster calcium 500 MG Tabs tablet  Take 500 mg of elemental calcium by mouth 2 (two) times daily.        Diagnostic Studies: Dg Hip Port Unilat With Pelvis 1v Right  03/25/2015   CLINICAL DATA:  Status post right total hip replacement.  EXAM: DG HIP (WITH OR WITHOUT PELVIS) 1V PORT RIGHT  COMPARISON:  Intraoperative fluoroscopic images earlier today  FINDINGS: Sequelae of recent right total hip arthroplasty are again identified. The prosthetic femoral and acetabular components appear  normally located. No acute fracture is identified. Postoperative soft tissue emphysema and skin staples are noted. Vascular calcifications are present.  IMPRESSION: Status post right total hip arthroplasty without radiographic evidence of immediate complication.   Electronically Signed   By: Sebastian Ache   On: 03/25/2015 15:26   Dg Hip Operative Unilat With Pelvis Right  03/25/2015   CLINICAL DATA:  Right anterior total hip arthroplasty.  EXAM: OPERATIVE RIGHT HIP (WITH PELVIS IF PERFORMED) 2 VIEWS  TECHNIQUE: Fluoroscopic spot image(s) were submitted for interpretation post-operatively.  COMPARISON:  None.  FINDINGS: Patient has undergone right total hip arthroplasty. Femoral head component appears well seated in the acetabular component on the frontal view provided. No evidence for acute fracture.  IMPRESSION: Right total hip arthroplasty.   Electronically Signed   By: Norva Pavlov M.D.   On: 03/25/2015 14:26    Disposition: 01-Home or Self Care      Discharge Instructions    Discharge patient    Complete by:  As directed            Follow-up Information    Follow up with Kathryne Hitch, MD In 2 weeks.   Specialty:  Orthopedic Surgery   Contact information:   [address] [place] [address] [phone]        Signed: Kathryne Hitch 03/28/2015, 6:53 AM

## 2015-03-30 NOTE — Anesthesia Postprocedure Evaluation (Signed)
  Anesthesia Post-op Note  Patient: Lindsey Ross  Procedure(s) Performed: Procedure(s): RIGHT TOTAL HIP ARTHROPLASTY ANTERIOR APPROACH (Right)  Patient Location: PACU  Anesthesia Type:General and Spinal  Level of Consciousness: awake and alert   Airway and Oxygen Therapy: Patient Spontanous Breathing  Post-op Pain: mild  Post-op Assessment: Post-op Vital signs reviewed     RLE Motor Response: Purposeful movement RLE Sensation: Full sensation, No numbness, No tingling L Sensory Level: S1-Sole of foot, small toes R Sensory Level: S1-Sole of foot, small toes  Post-op Vital Signs: Reviewed  Last Vitals:  Filed Vitals:   03/28/15 0521  BP: 101/72  Pulse: 80  Temp: 36.7 C  Resp: 16    Complications: No apparent anesthesia complications

## 2015-03-30 NOTE — Addendum Note (Signed)
Addendum  created 03/30/15 2240 by Marcene Duos, MD   Modules edited: Anesthesia Attestations

## 2015-03-31 ENCOUNTER — Non-Acute Institutional Stay (SKILLED_NURSING_FACILITY): Payer: Medicare Other | Admitting: Adult Health

## 2015-03-31 ENCOUNTER — Encounter: Payer: Self-pay | Admitting: Adult Health

## 2015-03-31 DIAGNOSIS — M1611 Unilateral primary osteoarthritis, right hip: Secondary | ICD-10-CM

## 2015-03-31 DIAGNOSIS — I10 Essential (primary) hypertension: Secondary | ICD-10-CM | POA: Diagnosis not present

## 2015-03-31 DIAGNOSIS — Z96649 Presence of unspecified artificial hip joint: Secondary | ICD-10-CM

## 2015-03-31 DIAGNOSIS — Z96641 Presence of right artificial hip joint: Secondary | ICD-10-CM

## 2015-03-31 DIAGNOSIS — D62 Acute posthemorrhagic anemia: Secondary | ICD-10-CM

## 2015-03-31 NOTE — Progress Notes (Signed)
Patient ID: Lindsey Ross, female   DOB: 21-Dec-1924, 80 y.o.   MRN: [id number]    DATE:  03/31/2015 MRN:  [id number]  BIRTHDAY: [birth year]  Facility:  Nursing Home Location:  [facility]  Nursing Home Room Number: 105-P  LEVEL OF CARE:  SNF (31)  Contact Information    Name Relation Home Work Mobile   [place] Spouse [phone]     Awilda Bill Daughter [phone]         Chief Complaint  Patient presents with  . Hospitalization Follow-up    Osteoarthritis S/P right total hip arthroplasty, Hypertension and Anemia    HISTORY OF PRESENT ILLNESS:  This is an 81 year old female who has been admitted to [facility] on 03/28/15 from [facility] with Osteoarthritis S/P right total hip arthroplasty. She has PMH of hypertension, frequent urination and burning sensation of bilateral feet. She has been admitted for a short-term rehabilitation.  PAST MEDICAL HISTORY:  Past Medical History  Diagnosis Date  . Hypertension   . Shortness of breath dyspnea     with exertion  . Frequent urination   . Arthritis   . Burning sensation of feet     Bilateral feet     CURRENT MEDICATIONS: Reviewed  Patient's Medications  New Prescriptions   No medications on file  Previous Medications   ACETAMINOPHEN (TYLENOL) 325 MG TABLET    Take 650 mg by mouth every 8 (eight) hours as needed for mild pain.   ASPIRIN EC 325 MG EC TABLET    Take 1 tablet (325 mg total) by mouth 2 (two) times daily after a meal.   HYDROCODONE-ACETAMINOPHEN (NORCO/VICODIN) 5-325 MG PER TABLET    Take 1-2 tablets by mouth every 4 (four) hours as needed for moderate pain (breakthrough pain).   METHOCARBAMOL (ROBAXIN) 500 MG TABLET    Take 1 tablet (500 mg total) by mouth every 6 (six) hours as needed for muscle spasms.   MULTIPLE VITAMINS-MINERALS (MULTIVITAMIN WITH MINERALS) TABLET    Take 1 tablet by mouth daily.   NAPROXEN SODIUM (ALEVE) 220 MG TABLET    Take 440 mg by mouth 2 (two)  times daily as needed (pain).   OLMESARTAN-HYDROCHLOROTHIAZIDE (BENICAR HCT) 40-25 MG PER TABLET    Take 1 tablet by mouth daily.   OMEGA-3 FATTY ACIDS (FISH OIL) 1000 MG CAPS    Take 1 capsule by mouth daily.   OYSTER SHELL (OYSTER CALCIUM) 500 MG TABS TABLET    Take 500 mg of elemental calcium by mouth 2 (two) times daily.  Modified Medications   No medications on file  Discontinued Medications   No medications on file     No Known Allergies   REVIEW OF SYSTEMS:  GENERAL: no change in appetite, no fatigue, no weight changes, no fever, chills or weakness SKIN: Denies rash, itching, wounds, ulcer sores, or nail abnormality EYES: Denies change in vision, dry eyes, eye pain, itching or discharge EARS: Denies change in hearing, ringing in ears, or earache NOSE: Denies nasal congestion or epistaxis MOUTH and THROAT: Denies oral discomfort, gingival pain or bleeding, pain from teeth or hoarseness   RESPIRATORY: no cough, SOB, DOE, wheezing, hemoptysis CARDIAC: no chest pain, edema or palpitations GI: no abdominal pain, diarrhea, constipation, heart burn, nausea or vomiting GU: Denies dysuria, frequency, hematuria, incontinence, or discharge MUSCULOSKELETAL: Denies joit pain, muscle pain, back pain, restricted movement, or unusual weakness CIRCULATION: Denies claudication, edema of legs, varicosities, or cold extremities NEUROLOGICAL: Denies dizziness, syncope, numbness,  or headache PSYCHIATRIC: Denies feeling of depression or anxiety. No report of hallucinations, insomnia, paranoia, or agitation ENDOCRINE: Denies polyphagia, polyuria, polydipsia, heat or cold intolerance HEME/LYMPH: Denies excessive bruising, petechia, enlarged lymph nodes, or bleeding problems IMMUNOLOGIC: Denies history of frequent infections, AIDS, or use of immunosuppressive agents    PHYSICAL EXAMINATION  GENERAL APPEARANCE: Well nourished. In no acute distress. Normal body habitus SKIN:  Right hip surgical  incision is covered with aquacel dressing, no erythema HEAD: Normal in size and contour. No evidence of trauma EYES: Lids open and close normally. No blepharitis, entropion or ectropion. PERRL. Conjunctivae are clear and sclerae are white. Lenses are without opacity EARS: Pinnae are normal. Patient hears normal voice tunes of the examiner MOUTH and THROAT: Lips are without lesions. Oral mucosa is moist and without lesions. Tongue is normal in shape, size, and color and without lesions NECK: supple, trachea midline, no neck masses, no thyroid tenderness, no thyromegaly LYMPHATICS: no LAN in the neck, no supraclavicular LAN RESPIRATORY: breathing is even & unlabored, BS CTAB CARDIAC: RRR, no murmur,no extra heart sounds, no edema GI: abdomen soft, normal BS, no masses, no tenderness, no hepatomegaly, no splenomegaly EXTREMITIES:  Able to move X 4 extremities PSYCHIATRIC: Alert and oriented X 3. Affect and behavior are appropriate  LABS/RADIOLOGY: Labs reviewed: Basic Metabolic Panel:  Recent Labs  11/91/47 1118 03/26/15 0600  NA 140 139  K 4.6 3.9  CL 102 104  CO2 31 30  GLUCOSE 122* 108*  BUN 22* 14  CREATININE 0.72 0.64  CALCIUM 10.4* 8.7*   Liver Function Tests:  Recent Labs  03/18/15 1118  AST 20  ALT 14  ALKPHOS 47  BILITOT 0.8  PROT 7.3  ALBUMIN 4.0   CBC:  Recent Labs  03/18/15 1118 03/26/15 0600 03/27/15 0433  WBC 6.6 6.0 9.3  HGB 13.6 11.1* 10.4*  HCT 43.2 35.2* 33.5*  MCV 84.5 85.2 85.5  PLT 225 193 197     Dg Hip Port Unilat With Pelvis 1v Right  03/25/2015   CLINICAL DATA:  Status post right total hip replacement.  EXAM: DG HIP (WITH OR WITHOUT PELVIS) 1V PORT RIGHT  COMPARISON:  Intraoperative fluoroscopic images earlier today  FINDINGS: Sequelae of recent right total hip arthroplasty are again identified. The prosthetic femoral and acetabular components appear normally located. No acute fracture is identified. Postoperative soft tissue emphysema  and skin staples are noted. Vascular calcifications are present.  IMPRESSION: Status post right total hip arthroplasty without radiographic evidence of immediate complication.   Electronically Signed   By: Sebastian Ache   On: 03/25/2015 15:26   Dg Hip Operative Unilat With Pelvis Right  03/25/2015   CLINICAL DATA:  Right anterior total hip arthroplasty.  EXAM: OPERATIVE RIGHT HIP (WITH PELVIS IF PERFORMED) 2 VIEWS  TECHNIQUE: Fluoroscopic spot image(s) were submitted for interpretation post-operatively.  COMPARISON:  None.  FINDINGS: Patient has undergone right total hip arthroplasty. Femoral head component appears well seated in the acetabular component on the frontal view provided. No evidence for acute fracture.  IMPRESSION: Right total hip arthroplasty.   Electronically Signed   By: Norva Pavlov M.D.   On: 03/25/2015 14:26    ASSESSMENT/PLAN:  Osteoarthritis S/P right total hip arthroplasty - for rehabilitation; follow-up with Dr. Doneen Poisson, orthopedic surgeon, in 2 weeks; continue ASA 325 mg 1 tab PO BID for DVT prophylaxis; Tylenol 650 mg PO Q 8 hours PRN and Norco 5/325 mg 1-2 tabs PO Q 4 hours PRN for pain;  and Robaxin 500 mg 1 tab PO Q 6 hours PRN for muscle spasm  Hypertension - continue Benicar HCT 40-25 mg 1 tab PO Q D; check BMP  Anemia, acute blood loss - hgb 10.4; check CBC    Goals of care:  Short-term rehabilitation    [facility], NP [facility] [phone]

## 2015-04-01 LAB — BASIC METABOLIC PANEL
BUN: 23 mg/dL — AB (ref 4–21)
Creatinine: 0.6 mg/dL (ref 0.5–1.1)
GLUCOSE: 111 mg/dL
Potassium: 4.7 mmol/L (ref 3.4–5.3)
SODIUM: 142 mmol/L (ref 137–147)

## 2015-04-01 LAB — CBC AND DIFFERENTIAL
HCT: 33 % — AB (ref 36–46)
HEMOGLOBIN: 10.4 g/dL — AB (ref 12.0–16.0)
Platelets: 283 10*3/uL (ref 150–399)
WBC: 5.8 10^3/mL

## 2015-04-02 ENCOUNTER — Non-Acute Institutional Stay (SKILLED_NURSING_FACILITY): Payer: Medicare Other | Admitting: Internal Medicine

## 2015-04-02 DIAGNOSIS — M1611 Unilateral primary osteoarthritis, right hip: Secondary | ICD-10-CM

## 2015-04-02 DIAGNOSIS — K5901 Slow transit constipation: Secondary | ICD-10-CM | POA: Diagnosis not present

## 2015-04-02 DIAGNOSIS — I10 Essential (primary) hypertension: Secondary | ICD-10-CM | POA: Diagnosis not present

## 2015-04-02 DIAGNOSIS — K219 Gastro-esophageal reflux disease without esophagitis: Secondary | ICD-10-CM

## 2015-04-02 DIAGNOSIS — D62 Acute posthemorrhagic anemia: Secondary | ICD-10-CM | POA: Diagnosis not present

## 2015-04-02 NOTE — Progress Notes (Signed)
Patient ID: Lindsey Ross, female   DOB: [birth year], 81 y.o.   MRN: [id number]     [facility]  PCP: Hoyle Sauer, MD  Code Status: Full Code  No Known Allergies  Chief Complaint  Patient presents with  . New Admit To SNF    New Admit from Hospital     HPI:  81 y.o. patient is here for short term rehabilitation post hospital admission from 03/25/15-03/28/15 with right hip OA. She underwent right total hip arthroplasty. She has been constipated. Her pain is controlled with current pain regimen. Denies any concerns. No new concern from staff.   Review of Systems:  Constitutional: Negative for fever, chills, diaphoresis.  HENT: Negative for headache, congestion, nasal discharge Eyes: Negative for eye pain, blurred vision, double vision and discharge.  Respiratory: Negative for cough, shortness of breath and wheezing.   Cardiovascular: Negative for chest pain, palpitations, leg swelling.  Gastrointestinal: Negative for heartburn, nausea, vomiting, abdominal pain. Last bowel movement 2 days back Genitourinary: Negative for dysuria and flank pain  Musculoskeletal: Negative for back pain, falls Skin: Negative for itching, rash.  Neurological: Negative for dizziness, tingling, focal weakness Psychiatric/Behavioral: Negative for depression   Past Medical History  Diagnosis Date  . Hypertension   . Shortness of breath dyspnea     with exertion  . Frequent urination   . Arthritis   . Burning sensation of feet     Bilateral feet   Past Surgical History  Procedure Laterality Date  . Abdominal hysterectomy    . Cholecystectomy    . Ankle fracture surgery Right 2003  . Eye surgery Bilateral     cataract removal  . Total hip arthroplasty Right 03/25/2015    Procedure: RIGHT TOTAL HIP ARTHROPLASTY ANTERIOR APPROACH;  Surgeon: Kathryne Hitch, MD;  Location: [facility] OR;  Service: Orthopedics;  Laterality: Right;   Social History:   reports that she has never  smoked. She does not have any smokeless tobacco history on file. She reports that she does not drink alcohol or use illicit drugs.  No family history on file.  Medications:   Medication List       This list is accurate as of: 04/02/15  3:22 PM.  Always use your most recent med list.               acetaminophen 325 MG tablet  Commonly known as:  TYLENOL  Take 650 mg by mouth every 8 (eight) hours as needed for mild pain.     aspirin 325 MG EC tablet  Take 1 tablet (325 mg total) by mouth 2 (two) times daily after a meal.     docusate sodium 100 MG capsule  Commonly known as:  COLACE  Take 100 mg by mouth 2 (two) times daily.     Fish Oil 1000 MG Caps  Take 1 capsule by mouth daily.     HYDROcodone-acetaminophen 5-325 MG per tablet  Commonly known as:  NORCO/VICODIN  Take 1-2 tablets by mouth every 4 (four) hours as needed for moderate pain (breakthrough pain).     methocarbamol 500 MG tablet  Commonly known as:  ROBAXIN  Take 1 tablet (500 mg total) by mouth every 6 (six) hours as needed for muscle spasms.     multivitamin with minerals tablet  Take 1 tablet by mouth daily.     olmesartan-hydrochlorothiazide 40-25 MG per tablet  Commonly known as:  BENICAR HCT  Take one tablet by mouth once  daily for blood pressure     omeprazole 20 MG capsule  Commonly known as:  PRILOSEC  Take one tablet by mouth once daily for stomach     oyster calcium 500 MG Tabs tablet  Take 500 mg of elemental calcium by mouth 2 (two) times daily.         Physical Exam: Filed Vitals:   04/02/15 1021  BP: 131/69  Pulse: 91  Temp: 97.8 F (36.6 C)  TempSrc: Oral  Resp: 18  Height: 5\' 4"  (1.626 m)  Weight: 183 lb 3.2 oz (83.099 kg)  SpO2: 97%    General- elderly female, in no acute distress Head- normocephalic, atraumatic Throat- moist mucus membrane Eyes- PERRLA, EOMI, no pallor, no icterus, no discharge, normal conjunctiva, normal sclera Neck- no cervical  lymphadenopathy Cardiovascular- normal s1,s2, no murmurs, palpable dorsalis pedis and radial pulses, 1+ right leg edema Respiratory- bilateral clear to auscultation, no wheeze, no rhonchi, no crackles, no use of accessory muscles Abdomen- bowel sounds present, soft, non tender Musculoskeletal- able to move all 4 extremities, right leg ROM restricted at hip joint  Neurological- no focal deficit, alert and oriented to person, place and time Skin- warm and dry, right hip incision area with aquacel dressing soaked in drainage with area increased from time of admission, dressing intact, mild erythema present Psychiatry- normal mood and affect    Labs reviewed: Basic Metabolic Panel:  Recent Labs  16/10/96 1118 03/26/15 0600 04/01/15  NA 140 139 142  K 4.6 3.9 4.7  CL 102 104  --   CO2 31 30  --   GLUCOSE 122* 108*  --   BUN 22* 14 23*  CREATININE 0.72 0.64 0.6  CALCIUM 10.4* 8.7*  --    Liver Function Tests:  Recent Labs  03/18/15 1118  AST 20  ALT 14  ALKPHOS 47  BILITOT 0.8  PROT 7.3  ALBUMIN 4.0   No results for input(s): LIPASE, AMYLASE in the last 8760 hours. No results for input(s): AMMONIA in the last 8760 hours. CBC:  Recent Labs  03/18/15 1118 03/26/15 0600 03/27/15 0433 04/01/15  WBC 6.6 6.0 9.3 5.8  HGB 13.6 11.1* 10.4* 10.4*  HCT 43.2 35.2* 33.5* 33*  MCV 84.5 85.2 85.5  --   PLT 225 193 197 283    Assessment/Plan  Right hip OA S/p right hip arthroplasty. Will have her work with physical therapy and occupational therapy team to help with gait training and muscle strengthening exercises.fall precautions. Skin care. Encourage to be out of bed. Add ted hose to help with leg edema. Has f/u with orthopedics. WBAT. Continue tylenol and vicodin 5/325 1-2 tab q4h prn pain and robaxin 500 mg q6h prn muscle spasm. Continue aspirin 325 mg bid for dvt prophylaxis. Continue calcium supplement  Acute blood loss anemia Post op, hb 10.4 on facility lab follow up.  Start ferrous sulfate 325 mg daily for now and check cbc in 1 week  Constipation Currently on colace 100 mg bid. Add miralax 17 g daily for now, hydration encouraged  HTN Stable bp, currently on losartan-hctz 40-25 in place of benicar hctz for formulary reason, monitor bp readings  gerd Stable, continue prilosec 20 mg daily   Goals of care: short term rehabilitation   Labs/tests ordered: cbc  Family/ staff Communication: reviewed care plan with patient and nursing supervisor    Oneal Grout, MD  [facility] [phone] (Monday-Friday 8 am - 5 pm) [phone] (afterhours)

## 2015-04-04 ENCOUNTER — Non-Acute Institutional Stay (SKILLED_NURSING_FACILITY): Payer: Medicare Other | Admitting: Adult Health

## 2015-04-04 ENCOUNTER — Encounter: Payer: Self-pay | Admitting: Adult Health

## 2015-04-04 DIAGNOSIS — K219 Gastro-esophageal reflux disease without esophagitis: Secondary | ICD-10-CM

## 2015-04-04 DIAGNOSIS — M1611 Unilateral primary osteoarthritis, right hip: Secondary | ICD-10-CM | POA: Diagnosis not present

## 2015-04-04 DIAGNOSIS — Z96649 Presence of unspecified artificial hip joint: Secondary | ICD-10-CM | POA: Diagnosis not present

## 2015-04-04 DIAGNOSIS — K5901 Slow transit constipation: Secondary | ICD-10-CM | POA: Diagnosis not present

## 2015-04-04 DIAGNOSIS — D62 Acute posthemorrhagic anemia: Secondary | ICD-10-CM | POA: Diagnosis not present

## 2015-04-04 DIAGNOSIS — I10 Essential (primary) hypertension: Secondary | ICD-10-CM | POA: Diagnosis not present

## 2015-04-04 DIAGNOSIS — Z96641 Presence of right artificial hip joint: Secondary | ICD-10-CM

## 2015-04-06 NOTE — Progress Notes (Signed)
Patient ID: Lindsey Ross, female   DOB: 1925-01-21, 81 y.o.   MRN: [id number]    DATE: 04/04/15 MRN:  [id number]  BIRTHDAY: December 21, 1924  Facility:  Nursing Home Location:  [facility]  Nursing Home Room Number: 105-P  LEVEL OF CARE:  SNF (31)  Contact Information    Name Relation Home Work Mobile   [place] Spouse [phone]     Awilda Bill Daughter [phone]         Chief Complaint  Patient presents with  . Discharge Note    Osteoarthritis S/P right total hip arthroplasty, Hypertension, GERD, Constipation and Anemia    HISTORY OF PRESENT ILLNESS:  This is an 81 year old female who is for discharge home with Home health PT for endurance and OT for ADLs. DME:  Standard rolling walker and bedside commode. She has been admitted to [facility] on 03/28/15 from [facility] with Osteoarthritis S/P rig ht total hip arthroplasty. She has PMH of hypertension, frequent urination and burning sensation of bilateral feet.   Patient was admitted to this facility for short-term rehabilitation after the patient's recent hospitalization.  Patient has completed SNF rehabilitation and therapy has cleared the patient for discharge.  PAST MEDICAL HISTORY:  Past Medical History  Diagnosis Date  . Hypertension   . Shortness of breath dyspnea     with exertion  . Frequent urination   . Arthritis   . Burning sensation of feet     Bilateral feet     CURRENT MEDICATIONS: Reviewed     Medication List       This list is accurate as of: 04/04/15 11:59 PM.  Always use your most recent med list.               acetaminophen 325 MG tablet  Commonly known as:  TYLENOL  Take 650 mg by mouth every 8 (eight) hours as needed for mild pain.     aspirin 325 MG EC tablet  Take 1 tablet (325 mg total) by mouth 2 (two) times daily after a meal.     docusate sodium 100 MG capsule  Commonly known as:  COLACE  Take 100 mg by mouth 2 (two) times daily.     ferrous sulfate 325 (65 FE) MG tablet  Take 325 mg by mouth daily.     Fish Oil 1000 MG Caps  Take 1 capsule by mouth daily.     HYDROcodone-acetaminophen 5-325 MG per tablet  Commonly known as:  NORCO/VICODIN  Take 1-2 tablets by mouth every 4 (four) hours as needed for moderate pain (breakthrough pain).     losartan-hydrochlorothiazide 100-25 MG per tablet  Commonly known as:  HYZAAR  Take 1 tablet by mouth daily.     methocarbamol 500 MG tablet  Commonly known as:  ROBAXIN  Take 1 tablet (500 mg total) by mouth every 6 (six) hours as needed for muscle spasms.     MIRALAX PO  Take 17 g by mouth daily.     multivitamin with minerals tablet  Take 1 tablet by mouth daily.     omeprazole 20 MG capsule  Commonly known as:  PRILOSEC  Take one tablet by mouth once daily for stomach     oyster calcium 500 MG Tabs tablet  Take 500 mg of elemental calcium by mouth 2 (two) times daily.         No Known Allergies   REVIEW OF SYSTEMS:  GENERAL: no change in appetite, no fatigue,  no weight changes, no fever, chills or weakness EYES: Denies change in vision, dry eyes, eye pain, itching or discharge EARS: Denies change in hearing, ringing in ears, or earache NOSE: Denies nasal congestion or epistaxis MOUTH and THROAT: Denies oral discomfort, gingival pain or bleeding, pain from teeth or hoarseness   RESPIRATORY: no cough, SOB, DOE, wheezing, hemoptysis CARDIAC: no chest pain, edema or palpitations GI: no abdominal pain, diarrhea, constipation, heart burn, nausea or vomiting GU: Denies dysuria, frequency, hematuria, incontinence, or discharge PSYCHIATRIC: Denies feeling of depression or anxiety. No report of hallucinations, insomnia, paranoia, or agitation  PHYSICAL EXAMINATION  GENERAL APPEARANCE: Well nourished. In no acute distress. Normal body habitus SKIN:  Right hip surgical incision is covered with aquacel dressing, no erythema HEAD: Normal in size and contour. No  evidence of trauma EYES: Lids open and close normally. No blepharitis, entropion or ectropion. PERRL. Conjunctivae are clear and sclerae are white. Lenses are without opacity EARS: Pinnae are normal. Patient hears normal voice tunes of the examiner MOUTH and THROAT: Lips are without lesions. Oral mucosa is moist and without lesions. Tongue is normal in shape, size, and color and without lesions NECK: supple, trachea midline, no neck masses, no thyroid tenderness, no thyromegaly LYMPHATICS: no LAN in the neck, no supraclavicular LAN RESPIRATORY: breathing is even & unlabored, BS CTAB CARDIAC: RRR, no murmur,no extra heart sounds, no edema GI: abdomen soft, normal BS, no masses, no tenderness, no hepatomegaly, no splenomegaly EXTREMITIES:  Able to move X 4 extremities PSYCHIATRIC: Alert and oriented X 3. Affect and behavior are appropriate  LABS/RADIOLOGY: Labs reviewed: 04/01/15  WBC 5.8 hemoglobin 10.4 hematocrit 33.0 MCV 81.7 platelet 283 sodium 142 potassium 4.7 glucose 111 EU and 23 creatinine 0.58 calcium 9.2 Basic Metabolic Panel:  Recent Labs  40/98/11 1118 03/26/15 0600 04/01/15  NA 140 139 142  K 4.6 3.9 4.7  CL 102 104  --   CO2 31 30  --   GLUCOSE 122* 108*  --   BUN 22* 14 23*  CREATININE 0.72 0.64 0.6  CALCIUM 10.4* 8.7*  --    Liver Function Tests:  Recent Labs  03/18/15 1118  AST 20  ALT 14  ALKPHOS 47  BILITOT 0.8  PROT 7.3  ALBUMIN 4.0   CBC:  Recent Labs  03/18/15 1118 03/26/15 0600 03/27/15 0433 04/01/15  WBC 6.6 6.0 9.3 5.8  HGB 13.6 11.1* 10.4* 10.4*  HCT 43.2 35.2* 33.5* 33*  MCV 84.5 85.2 85.5  --   PLT 225 193 197 283     Dg Hip Port Unilat With Pelvis 1v Right  03/25/2015   CLINICAL DATA:  Status post right total hip replacement.  EXAM: DG HIP (WITH OR WITHOUT PELVIS) 1V PORT RIGHT  COMPARISON:  Intraoperative fluoroscopic images earlier today  FINDINGS: Sequelae of recent right total hip arthroplasty are again identified. The prosthetic  femoral and acetabular components appear normally located. No acute fracture is identified. Postoperative soft tissue emphysema and skin staples are noted. Vascular calcifications are present.  IMPRESSION: Status post right total hip arthroplasty without radiographic evidence of immediate complication.   Electronically Signed   By: Sebastian Ache   On: 03/25/2015 15:26   Dg Hip Operative Unilat With Pelvis Right  03/25/2015   CLINICAL DATA:  Right anterior total hip arthroplasty.  EXAM: OPERATIVE RIGHT HIP (WITH PELVIS IF PERFORMED) 2 VIEWS  TECHNIQUE: Fluoroscopic spot image(s) were submitted for interpretation post-operatively.  COMPARISON:  None.  FINDINGS: Patient has undergone right total  hip arthroplasty. Femoral head component appears well seated in the acetabular component on the frontal view provided. No evidence for acute fracture.  IMPRESSION: Right total hip arthroplasty.   Electronically Signed   By: Norva Pavlov M.D.   On: 03/25/2015 14:26    ASSESSMENT/PLAN:  Osteoarthritis S/P right total hip arthroplasty - for Home health PT and OT; follow-up with Dr. Doneen Poisson, orthopedic surgeon; continue ASA 325 mg 1 tab PO BID for DVT prophylaxis; Tylenol 650 mg PO Q 8 hours PRN and Norco 5/325 mg 1-2 tabs PO Q 4 hours PRN for pain; and Robaxin 500 mg 1 tab PO Q 6 hours PRN for muscle spasm  Hypertension - continue Losartan- HCT 100-25 mg 1 tab PO Q D  Anemia, acute blood loss - hgb 10.4; continue FeSO4 325 mg 1 tab PO daily  GERD - continue Prilosec 20 mg daily  Constipation - continue Colace 100 mg PO BID and Miralax 17 gm PO daily    I have filled out patient's discharge paperwork and written prescriptions.  Patient will receive home health PT and OT, ST.  DME provided:  Standard rolling walker and bedside commode  Total discharge time: Greater than 30 minutes  Discharge time involved coordination of the discharge process with social worker, nursing staff and therapy  department. Medical justification for home health services/DME verified.    [facility], NP [employer] [phone]

## 2015-04-09 DIAGNOSIS — M25551 Pain in right hip: Secondary | ICD-10-CM | POA: Diagnosis not present

## 2015-04-09 DIAGNOSIS — M1611 Unilateral primary osteoarthritis, right hip: Secondary | ICD-10-CM | POA: Diagnosis not present

## 2015-04-12 DIAGNOSIS — Z471 Aftercare following joint replacement surgery: Secondary | ICD-10-CM | POA: Diagnosis not present

## 2015-04-12 DIAGNOSIS — R2689 Other abnormalities of gait and mobility: Secondary | ICD-10-CM | POA: Diagnosis not present

## 2015-04-12 DIAGNOSIS — Z96641 Presence of right artificial hip joint: Secondary | ICD-10-CM | POA: Diagnosis not present

## 2015-04-12 DIAGNOSIS — M1990 Unspecified osteoarthritis, unspecified site: Secondary | ICD-10-CM | POA: Diagnosis not present

## 2015-04-12 DIAGNOSIS — Z7982 Long term (current) use of aspirin: Secondary | ICD-10-CM | POA: Diagnosis not present

## 2015-04-12 DIAGNOSIS — I10 Essential (primary) hypertension: Secondary | ICD-10-CM | POA: Diagnosis not present

## 2015-04-14 DIAGNOSIS — R2689 Other abnormalities of gait and mobility: Secondary | ICD-10-CM | POA: Diagnosis not present

## 2015-04-14 DIAGNOSIS — Z96641 Presence of right artificial hip joint: Secondary | ICD-10-CM | POA: Diagnosis not present

## 2015-04-14 DIAGNOSIS — I10 Essential (primary) hypertension: Secondary | ICD-10-CM | POA: Diagnosis not present

## 2015-04-14 DIAGNOSIS — Z7982 Long term (current) use of aspirin: Secondary | ICD-10-CM | POA: Diagnosis not present

## 2015-04-14 DIAGNOSIS — Z471 Aftercare following joint replacement surgery: Secondary | ICD-10-CM | POA: Diagnosis not present

## 2015-04-14 DIAGNOSIS — M1990 Unspecified osteoarthritis, unspecified site: Secondary | ICD-10-CM | POA: Diagnosis not present

## 2015-04-16 DIAGNOSIS — Z96641 Presence of right artificial hip joint: Secondary | ICD-10-CM | POA: Diagnosis not present

## 2015-04-16 DIAGNOSIS — R2689 Other abnormalities of gait and mobility: Secondary | ICD-10-CM | POA: Diagnosis not present

## 2015-04-16 DIAGNOSIS — Z471 Aftercare following joint replacement surgery: Secondary | ICD-10-CM | POA: Diagnosis not present

## 2015-04-16 DIAGNOSIS — M1990 Unspecified osteoarthritis, unspecified site: Secondary | ICD-10-CM | POA: Diagnosis not present

## 2015-04-16 DIAGNOSIS — I10 Essential (primary) hypertension: Secondary | ICD-10-CM | POA: Diagnosis not present

## 2015-04-16 DIAGNOSIS — Z7982 Long term (current) use of aspirin: Secondary | ICD-10-CM | POA: Diagnosis not present

## 2015-04-18 DIAGNOSIS — M1990 Unspecified osteoarthritis, unspecified site: Secondary | ICD-10-CM | POA: Diagnosis not present

## 2015-04-18 DIAGNOSIS — I10 Essential (primary) hypertension: Secondary | ICD-10-CM | POA: Diagnosis not present

## 2015-04-18 DIAGNOSIS — Z7982 Long term (current) use of aspirin: Secondary | ICD-10-CM | POA: Diagnosis not present

## 2015-04-18 DIAGNOSIS — Z96641 Presence of right artificial hip joint: Secondary | ICD-10-CM | POA: Diagnosis not present

## 2015-04-18 DIAGNOSIS — Z471 Aftercare following joint replacement surgery: Secondary | ICD-10-CM | POA: Diagnosis not present

## 2015-04-18 DIAGNOSIS — R2689 Other abnormalities of gait and mobility: Secondary | ICD-10-CM | POA: Diagnosis not present

## 2015-04-21 DIAGNOSIS — Z96641 Presence of right artificial hip joint: Secondary | ICD-10-CM | POA: Diagnosis not present

## 2015-04-21 DIAGNOSIS — Z471 Aftercare following joint replacement surgery: Secondary | ICD-10-CM | POA: Diagnosis not present

## 2015-04-21 DIAGNOSIS — M1990 Unspecified osteoarthritis, unspecified site: Secondary | ICD-10-CM | POA: Diagnosis not present

## 2015-04-21 DIAGNOSIS — R2689 Other abnormalities of gait and mobility: Secondary | ICD-10-CM | POA: Diagnosis not present

## 2015-04-21 DIAGNOSIS — I10 Essential (primary) hypertension: Secondary | ICD-10-CM | POA: Diagnosis not present

## 2015-04-21 DIAGNOSIS — Z7982 Long term (current) use of aspirin: Secondary | ICD-10-CM | POA: Diagnosis not present

## 2015-04-23 DIAGNOSIS — R2689 Other abnormalities of gait and mobility: Secondary | ICD-10-CM | POA: Diagnosis not present

## 2015-04-23 DIAGNOSIS — M1990 Unspecified osteoarthritis, unspecified site: Secondary | ICD-10-CM | POA: Diagnosis not present

## 2015-04-23 DIAGNOSIS — Z7982 Long term (current) use of aspirin: Secondary | ICD-10-CM | POA: Diagnosis not present

## 2015-04-23 DIAGNOSIS — I10 Essential (primary) hypertension: Secondary | ICD-10-CM | POA: Diagnosis not present

## 2015-04-23 DIAGNOSIS — Z96641 Presence of right artificial hip joint: Secondary | ICD-10-CM | POA: Diagnosis not present

## 2015-04-23 DIAGNOSIS — Z471 Aftercare following joint replacement surgery: Secondary | ICD-10-CM | POA: Diagnosis not present

## 2015-04-25 DIAGNOSIS — M1990 Unspecified osteoarthritis, unspecified site: Secondary | ICD-10-CM | POA: Diagnosis not present

## 2015-04-25 DIAGNOSIS — I10 Essential (primary) hypertension: Secondary | ICD-10-CM | POA: Diagnosis not present

## 2015-04-25 DIAGNOSIS — R2689 Other abnormalities of gait and mobility: Secondary | ICD-10-CM | POA: Diagnosis not present

## 2015-04-25 DIAGNOSIS — Z96641 Presence of right artificial hip joint: Secondary | ICD-10-CM | POA: Diagnosis not present

## 2015-04-25 DIAGNOSIS — Z7982 Long term (current) use of aspirin: Secondary | ICD-10-CM | POA: Diagnosis not present

## 2015-04-25 DIAGNOSIS — Z471 Aftercare following joint replacement surgery: Secondary | ICD-10-CM | POA: Diagnosis not present

## 2015-04-29 DIAGNOSIS — M1990 Unspecified osteoarthritis, unspecified site: Secondary | ICD-10-CM | POA: Diagnosis not present

## 2015-04-29 DIAGNOSIS — R2689 Other abnormalities of gait and mobility: Secondary | ICD-10-CM | POA: Diagnosis not present

## 2015-04-29 DIAGNOSIS — Z471 Aftercare following joint replacement surgery: Secondary | ICD-10-CM | POA: Diagnosis not present

## 2015-04-29 DIAGNOSIS — I10 Essential (primary) hypertension: Secondary | ICD-10-CM | POA: Diagnosis not present

## 2015-04-29 DIAGNOSIS — Z7982 Long term (current) use of aspirin: Secondary | ICD-10-CM | POA: Diagnosis not present

## 2015-04-29 DIAGNOSIS — Z96641 Presence of right artificial hip joint: Secondary | ICD-10-CM | POA: Diagnosis not present

## 2015-05-01 DIAGNOSIS — Z7982 Long term (current) use of aspirin: Secondary | ICD-10-CM | POA: Diagnosis not present

## 2015-05-01 DIAGNOSIS — Z96641 Presence of right artificial hip joint: Secondary | ICD-10-CM | POA: Diagnosis not present

## 2015-05-01 DIAGNOSIS — I10 Essential (primary) hypertension: Secondary | ICD-10-CM | POA: Diagnosis not present

## 2015-05-01 DIAGNOSIS — Z471 Aftercare following joint replacement surgery: Secondary | ICD-10-CM | POA: Diagnosis not present

## 2015-05-01 DIAGNOSIS — R2689 Other abnormalities of gait and mobility: Secondary | ICD-10-CM | POA: Diagnosis not present

## 2015-05-01 DIAGNOSIS — M1990 Unspecified osteoarthritis, unspecified site: Secondary | ICD-10-CM | POA: Diagnosis not present

## 2015-05-05 DIAGNOSIS — M1990 Unspecified osteoarthritis, unspecified site: Secondary | ICD-10-CM | POA: Diagnosis not present

## 2015-05-05 DIAGNOSIS — R2689 Other abnormalities of gait and mobility: Secondary | ICD-10-CM | POA: Diagnosis not present

## 2015-05-05 DIAGNOSIS — Z471 Aftercare following joint replacement surgery: Secondary | ICD-10-CM | POA: Diagnosis not present

## 2015-05-05 DIAGNOSIS — I10 Essential (primary) hypertension: Secondary | ICD-10-CM | POA: Diagnosis not present

## 2015-05-05 DIAGNOSIS — Z7982 Long term (current) use of aspirin: Secondary | ICD-10-CM | POA: Diagnosis not present

## 2015-05-05 DIAGNOSIS — Z96641 Presence of right artificial hip joint: Secondary | ICD-10-CM | POA: Diagnosis not present

## 2015-05-09 DIAGNOSIS — Z471 Aftercare following joint replacement surgery: Secondary | ICD-10-CM | POA: Diagnosis not present

## 2015-05-09 DIAGNOSIS — R2689 Other abnormalities of gait and mobility: Secondary | ICD-10-CM | POA: Diagnosis not present

## 2015-05-09 DIAGNOSIS — M1990 Unspecified osteoarthritis, unspecified site: Secondary | ICD-10-CM | POA: Diagnosis not present

## 2015-05-09 DIAGNOSIS — Z96641 Presence of right artificial hip joint: Secondary | ICD-10-CM | POA: Diagnosis not present

## 2015-05-09 DIAGNOSIS — Z7982 Long term (current) use of aspirin: Secondary | ICD-10-CM | POA: Diagnosis not present

## 2015-05-09 DIAGNOSIS — I10 Essential (primary) hypertension: Secondary | ICD-10-CM | POA: Diagnosis not present

## 2015-06-17 DIAGNOSIS — Z23 Encounter for immunization: Secondary | ICD-10-CM | POA: Diagnosis not present

## 2015-11-05 DIAGNOSIS — M25551 Pain in right hip: Secondary | ICD-10-CM | POA: Diagnosis not present

## 2015-11-05 DIAGNOSIS — M1611 Unilateral primary osteoarthritis, right hip: Secondary | ICD-10-CM | POA: Diagnosis not present

## 2015-12-08 DIAGNOSIS — I10 Essential (primary) hypertension: Secondary | ICD-10-CM | POA: Diagnosis not present

## 2015-12-08 DIAGNOSIS — Z0000 Encounter for general adult medical examination without abnormal findings: Secondary | ICD-10-CM | POA: Insufficient documentation

## 2015-12-08 DIAGNOSIS — N390 Urinary tract infection, site not specified: Secondary | ICD-10-CM | POA: Diagnosis not present

## 2015-12-08 DIAGNOSIS — E1151 Type 2 diabetes mellitus with diabetic peripheral angiopathy without gangrene: Secondary | ICD-10-CM | POA: Diagnosis not present

## 2015-12-08 DIAGNOSIS — E784 Other hyperlipidemia: Secondary | ICD-10-CM | POA: Diagnosis not present

## 2015-12-08 DIAGNOSIS — R8299 Other abnormal findings in urine: Secondary | ICD-10-CM | POA: Diagnosis not present

## 2015-12-08 DIAGNOSIS — M810 Age-related osteoporosis without current pathological fracture: Secondary | ICD-10-CM | POA: Diagnosis not present

## 2015-12-15 DIAGNOSIS — E784 Other hyperlipidemia: Secondary | ICD-10-CM | POA: Diagnosis not present

## 2015-12-15 DIAGNOSIS — E1151 Type 2 diabetes mellitus with diabetic peripheral angiopathy without gangrene: Secondary | ICD-10-CM | POA: Diagnosis not present

## 2015-12-15 DIAGNOSIS — K862 Cyst of pancreas: Secondary | ICD-10-CM | POA: Diagnosis not present

## 2015-12-15 DIAGNOSIS — N183 Chronic kidney disease, stage 3 (moderate): Secondary | ICD-10-CM | POA: Diagnosis not present

## 2015-12-15 DIAGNOSIS — Z1389 Encounter for screening for other disorder: Secondary | ICD-10-CM | POA: Diagnosis not present

## 2015-12-15 DIAGNOSIS — I452 Bifascicular block: Secondary | ICD-10-CM | POA: Diagnosis not present

## 2015-12-15 DIAGNOSIS — M25551 Pain in right hip: Secondary | ICD-10-CM | POA: Diagnosis not present

## 2015-12-15 DIAGNOSIS — Z0000 Encounter for general adult medical examination without abnormal findings: Secondary | ICD-10-CM | POA: Diagnosis not present

## 2015-12-15 DIAGNOSIS — M810 Age-related osteoporosis without current pathological fracture: Secondary | ICD-10-CM | POA: Diagnosis not present

## 2015-12-15 DIAGNOSIS — K449 Diaphragmatic hernia without obstruction or gangrene: Secondary | ICD-10-CM | POA: Diagnosis not present

## 2015-12-15 DIAGNOSIS — I7090 Unspecified atherosclerosis: Secondary | ICD-10-CM | POA: Diagnosis not present

## 2015-12-15 DIAGNOSIS — I10 Essential (primary) hypertension: Secondary | ICD-10-CM | POA: Diagnosis not present

## 2016-06-10 DIAGNOSIS — Z23 Encounter for immunization: Secondary | ICD-10-CM | POA: Diagnosis not present

## 2016-07-02 DIAGNOSIS — Z1231 Encounter for screening mammogram for malignant neoplasm of breast: Secondary | ICD-10-CM | POA: Diagnosis not present

## 2016-07-02 DIAGNOSIS — Z803 Family history of malignant neoplasm of breast: Secondary | ICD-10-CM | POA: Diagnosis not present

## 2016-08-31 ENCOUNTER — Ambulatory Visit (INDEPENDENT_AMBULATORY_CARE_PROVIDER_SITE_OTHER): Payer: Medicare Other

## 2016-08-31 ENCOUNTER — Ambulatory Visit (INDEPENDENT_AMBULATORY_CARE_PROVIDER_SITE_OTHER): Payer: Medicare Other | Admitting: Student

## 2016-08-31 VITALS — BP 171/96 | HR 81 | Temp 98.0°F | Resp 16 | Ht 64.0 in | Wt 184.2 lb

## 2016-08-31 DIAGNOSIS — M25531 Pain in right wrist: Secondary | ICD-10-CM | POA: Diagnosis not present

## 2016-08-31 DIAGNOSIS — M545 Low back pain, unspecified: Secondary | ICD-10-CM

## 2016-08-31 DIAGNOSIS — S52591A Other fractures of lower end of right radius, initial encounter for closed fracture: Secondary | ICD-10-CM | POA: Diagnosis not present

## 2016-08-31 DIAGNOSIS — S52601A Unspecified fracture of lower end of right ulna, initial encounter for closed fracture: Secondary | ICD-10-CM | POA: Diagnosis not present

## 2016-08-31 DIAGNOSIS — S52501A Unspecified fracture of the lower end of right radius, initial encounter for closed fracture: Secondary | ICD-10-CM

## 2016-08-31 HISTORY — DX: Low back pain, unspecified: M54.50

## 2016-08-31 HISTORY — DX: Pain in right wrist: M25.531

## 2016-08-31 HISTORY — DX: Unspecified fracture of lower end of right ulna, initial encounter for closed fracture: S52.501A

## 2016-08-31 HISTORY — DX: Unspecified fracture of the lower end of right radius, initial encounter for closed fracture: S52.601A

## 2016-08-31 MED ORDER — HYDROCODONE-ACETAMINOPHEN 5-325 MG PO TABS: 1.0000 | ORAL_TABLET | Freq: Four times a day (QID) | ORAL | 0 refills | Status: DC | PRN

## 2016-08-31 NOTE — Assessment & Plan Note (Addendum)
Sugar tong splint applied.  Norco for pain, may want to do 1/2 tablet at first.  Keep elevated and ice.  Follow up in orthopedic office for casting in 3-5 days.  Advised elevation, ice daily.  [facility] referral, [facility]

## 2016-08-31 NOTE — Progress Notes (Signed)
   Subjective:    Patient ID: Lindsey Ross, female    DOB: 11/09/1924, 83 y.o.   MRN: [id number]  HPI Presents with right wrist pain after falling taking care of her bushes today.  She does not remember exactly how she fell but does believe she may have fallen on an outstretched hand.  She has bruising and swelling over the ventral aspect of her distal forearm.  That is where it is most painful.   She also complains of some low back pain that began after her fall.  Denies radiation of pain, numbness or tingling.  No weakness.     PMHx - Updated and reviewed.  Contributory factors include: HTN, no DM PSHx - Updated and reviewed.  Contributory factors include:  total hip arthroplasty FHx - Updated and reviewed.  Contributory factors include:  Negative Social Hx - Updated and reviewed. Contributory factors include: Negative Medications - reviewed    Review of Systems  Constitutional: Negative for chills, fatigue and fever.  HENT: Negative for congestion and rhinorrhea.   Respiratory: Negative for cough, chest tightness and shortness of breath.   Cardiovascular: Negative for chest pain and leg swelling.  Gastrointestinal: Negative for abdominal pain and nausea.  Genitourinary: Negative for dysuria and urgency.  Musculoskeletal: Positive for joint swelling. Negative for myalgias.  Skin: Positive for color change. Negative for pallor, rash and wound.  Psychiatric/Behavioral: Negative for agitation and confusion.  All other systems reviewed and are negative.      Objective:   Physical Exam  Constitutional: She is oriented to person, place, and time. She appears well-developed and well-nourished. No distress.  HENT:  Head: Normocephalic and atraumatic.  Right Ear: External ear normal.  Left Ear: External ear normal.  Neck: Normal range of motion. Neck supple.  Cardiovascular: Intact distal pulses.   Pulmonary/Chest: Effort normal. No respiratory distress.  Musculoskeletal: She  exhibits edema.  Swelling and hematoma formation on ventral aspect of right wrist.  TTP at distal radius Unable to flex or extend wrist past 10 degrees   Neurological: She is alert and oriented to person, place, and time.  Sensation intact distally  Skin: Skin is warm. No rash noted. She is not diaphoretic. No erythema.  Psychiatric: She has a normal mood and affect. Her behavior is normal. Judgment and thought content normal.  Nursing note and vitals reviewed.  BP (!) 171/96 (BP Location: Left Arm, Patient Position: Sitting, Cuff Size: Small)   Pulse 81   Temp 98 F (36.7 C) (Oral)   Resp 16   Ht 5\' 4"  (1.626 m)   Wt 184 lb 3.2 oz (83.6 kg)   SpO2 94%   BMI 31.62 kg/m        Assessment & Plan:  Acute bilateral low back pain without sciatica Likely strain after fall.  Recommend exercises.  Do not believe has compression fracture or disc problem.  Follow up if not improved.  Closed fracture distal radius and ulna, right, initial encounter Sugar tong splint applied.  Norco for pain, may want to do 1/2 tablet at first.  Keep elevated and ice.  Follow up in orthopedic office for casting in 3-5 days.  Advised elevation, ice daily.  [facility]tho referral, [facility]

## 2016-08-31 NOTE — Patient Instructions (Signed)
Ulnar Fracture An ulnar fracture is a break in the ulna bone, which is the forearm bone that is located on the same side as your little finger. Your forearm is the part of your arm that is between your elbow and your wrist. It is made up of two bones: the radius and ulna. The ulna forms the point of your elbow at its upper end. The lower end can be felt on the outside of your wrist. An ulnar fracture can happen near the wrist or elbow or in the middle of your forearm. Middle forearm fractures usually break both the radius and the ulna. What are the causes? A heavy, direct blow to the forearm is the most common cause of an ulnar fracture. It takes a lot of force to break a bone in your forearm. This type of injury may be caused by:  An accident, such as a car or bike accident.  Falling with your arm outstretched. What increases the risk? You may be at greater risk for an ulnar fracture if you:  Play contact sports.  Have a condition that causes your bones to be weak or thin (osteoporosis). What are the signs or symptoms? An ulnar fracture causes pain immediately after the injury. You may need to support your forearm with your other hand. Other signs and symptoms include:  An abnormal bend or bump in your arm (deformity).  Swelling.  Bruising.  Numbness or weakness in your hand.  Inability to turn your hand from side to side (rotate). How is this diagnosed? Your health care provider may diagnose an ulnar fracture based on:  Your symptoms.  Your medical history, including any recent injury.  A physical exam. Your health care provider will look for any deformity and feel for tenderness over the break. Your health care provider will also check whether the bone is out of place.  An X-ray exam to confirm the diagnosis and learn more about the type of fracture. How is this treated? The goals of treatment are to get the bone in proper position for healing and to keep it from moving so it  will heal over time. Your treatment will depend on many factors, especially the type of fracture that you have.  If the fractured bone:  Is in the correct position (nondisplaced), you may only need to wear a cast or a splint.  Has a slightly displaced fracture, you may need to have the bones moved back into place manually (closed reduction) before the splint or cast is put on.  You may have a temporary splint before you have a plaster cast. The splint allows room for some swelling. After a few days, a cast can replace the splint.  You may have to wear the cast for about 6 weeks or as directed by your health care provider.  The cast may be changed after about 3 weeks or as directed by your health care provider.  After your cast is taken off, you may need physical therapy to regain full movement in your wrist or elbow.  You may need emergency surgery if you have:  A fractured bone that is out of position (displaced).  A fracture with multiple fragments (comminuted fracture).  A fracture that breaks the skin (open fracture). This type of fracture may require surgical wires, plates, or screws to hold the bone in place.  You may have X-rays every couple of weeks to check on your healing. Follow these instructions at home:  Keep the injured arm  above the level of your heart while you are sitting or lying down. This helps to reduce swelling and pain.  Apply ice to the injured area:  Put ice in a plastic bag.  Place a towel between your skin and the bag.  Leave the ice on for 20 minutes, 2-3 times per day.  Move your fingers often to avoid stiffness and to minimize swelling.  If you have a plaster or fiberglass cast:  Do not try to scratch the skin under the cast using sharp or pointed objects.  Check the skin around the cast every day. You may put lotion on any red or sore areas.  Keep your cast dry and clean.  If you have a plaster splint:  Wear the splint as  directed.  Loosen the elastic around the splint if your fingers become numb and tingle, or if they turn cold and blue.  Do not put pressure on any part of your cast until it is fully hardened. Rest your cast only on a pillow for the first 24 hours.  Protect your cast or splint while bathing or showering, as directed by your health care provider. Do not put your cast or splint into water.  Take medicines only as directed by your health care provider.  Return to activities, such as sports, as directed by your health care provider. Ask your health care provider what activities are safe for you.  Keep all follow-up visits as directed by your health care provider. This is important. Contact a health care provider if:  Your pain medicine is not helping.  Your cast gets damaged or it breaks.  Your cast becomes loose.  Your cast gets wet.  You have more severe pain or swelling than you did before the cast.  You have severe pain when stretching your fingers.  You continue to have pain or stiffness in your elbow or your wrist after your cast is taken off. Get help right away if:  You cannot move your fingers.  You lose feeling in your fingers or your hand.  Your hand or your fingers turn cold and pale or blue.  You notice a bad smell coming from your cast.  You have drainage from underneath your cast.  You have new stains from blood or drainage seeping through your cast. This information is not intended to replace advice given to you by your health care provider. Make sure you discuss any questions you have with your health care provider. Document Released: 01/20/2006 Document Revised: 04/11/2016 Document Reviewed: 01/16/2014 Elsevier Interactive Patient Education  2017 [employer]

## 2016-08-31 NOTE — Assessment & Plan Note (Signed)
Likely strain after fall.  Recommend exercises.  Do not believe has compression fracture or disc problem.  Follow up if not improved.

## 2016-09-06 DIAGNOSIS — S52501A Unspecified fracture of the lower end of right radius, initial encounter for closed fracture: Secondary | ICD-10-CM | POA: Diagnosis not present

## 2016-09-06 DIAGNOSIS — M545 Low back pain: Secondary | ICD-10-CM | POA: Diagnosis not present

## 2016-09-17 DIAGNOSIS — M545 Low back pain: Secondary | ICD-10-CM | POA: Diagnosis not present

## 2016-09-17 DIAGNOSIS — S52501D Unspecified fracture of the lower end of right radius, subsequent encounter for closed fracture with routine healing: Secondary | ICD-10-CM | POA: Diagnosis not present

## 2016-10-15 DIAGNOSIS — S52501D Unspecified fracture of the lower end of right radius, subsequent encounter for closed fracture with routine healing: Secondary | ICD-10-CM | POA: Diagnosis not present

## 2016-11-24 DIAGNOSIS — M799 Soft tissue disorder, unspecified: Secondary | ICD-10-CM | POA: Insufficient documentation

## 2016-11-24 DIAGNOSIS — M5134 Other intervertebral disc degeneration, thoracic region: Secondary | ICD-10-CM | POA: Diagnosis not present

## 2016-11-24 DIAGNOSIS — M546 Pain in thoracic spine: Secondary | ICD-10-CM | POA: Diagnosis not present

## 2016-11-24 DIAGNOSIS — Z6831 Body mass index (BMI) 31.0-31.9, adult: Secondary | ICD-10-CM | POA: Diagnosis not present

## 2016-11-24 DIAGNOSIS — R0781 Pleurodynia: Secondary | ICD-10-CM | POA: Diagnosis not present

## 2016-11-24 DIAGNOSIS — R29898 Other symptoms and signs involving the musculoskeletal system: Secondary | ICD-10-CM | POA: Diagnosis not present

## 2016-11-25 ENCOUNTER — Other Ambulatory Visit: Payer: Self-pay | Admitting: Internal Medicine

## 2016-11-25 DIAGNOSIS — M5134 Other intervertebral disc degeneration, thoracic region: Secondary | ICD-10-CM

## 2016-11-25 DIAGNOSIS — R0781 Pleurodynia: Secondary | ICD-10-CM

## 2016-11-27 ENCOUNTER — Emergency Department (HOSPITAL_COMMUNITY): Payer: Medicare Other

## 2016-11-27 ENCOUNTER — Emergency Department (HOSPITAL_COMMUNITY)
Admission: EM | Admit: 2016-11-27 | Discharge: 2016-11-27 | Disposition: A | Discharge status: Home / Self Care | Payer: Medicare Other | Attending: Emergency Medicine | Admitting: Emergency Medicine

## 2016-11-27 ENCOUNTER — Other Ambulatory Visit: Payer: Self-pay

## 2016-11-27 ENCOUNTER — Encounter (HOSPITAL_COMMUNITY): Payer: Self-pay | Admitting: Emergency Medicine

## 2016-11-27 DIAGNOSIS — R0602 Shortness of breath: Secondary | ICD-10-CM

## 2016-11-27 DIAGNOSIS — R05 Cough: Secondary | ICD-10-CM | POA: Diagnosis not present

## 2016-11-27 DIAGNOSIS — J3489 Other specified disorders of nose and nasal sinuses: Secondary | ICD-10-CM | POA: Diagnosis not present

## 2016-11-27 DIAGNOSIS — Z96641 Presence of right artificial hip joint: Secondary | ICD-10-CM | POA: Insufficient documentation

## 2016-11-27 DIAGNOSIS — Z79899 Other long term (current) drug therapy: Secondary | ICD-10-CM | POA: Diagnosis not present

## 2016-11-27 DIAGNOSIS — Z7982 Long term (current) use of aspirin: Secondary | ICD-10-CM | POA: Insufficient documentation

## 2016-11-27 DIAGNOSIS — I10 Essential (primary) hypertension: Secondary | ICD-10-CM | POA: Diagnosis not present

## 2016-11-27 DIAGNOSIS — J069 Acute upper respiratory infection, unspecified: Secondary | ICD-10-CM | POA: Insufficient documentation

## 2016-11-27 DIAGNOSIS — B9789 Other viral agents as the cause of diseases classified elsewhere: Secondary | ICD-10-CM | POA: Diagnosis not present

## 2016-11-27 LAB — COMPREHENSIVE METABOLIC PANEL
ALK PHOS: 50 U/L (ref 38–126)
ALT: 12 U/L — ABNORMAL LOW (ref 14–54)
ANION GAP: 12 (ref 5–15)
AST: 20 U/L (ref 15–41)
Albumin: 3.5 g/dL (ref 3.5–5.0)
BILIRUBIN TOTAL: 0.4 mg/dL (ref 0.3–1.2)
BUN: 22 mg/dL — ABNORMAL HIGH (ref 6–20)
CALCIUM: 9.3 mg/dL (ref 8.9–10.3)
CO2: 26 mmol/L (ref 22–32)
Chloride: 100 mmol/L — ABNORMAL LOW (ref 101–111)
Creatinine, Ser: 0.62 mg/dL (ref 0.44–1.00)
GFR calc non Af Amer: >60 mL/min (ref >60)
Glucose, Bld: 126 mg/dL — ABNORMAL HIGH (ref 65–99)
Potassium: 3.7 mmol/L (ref 3.5–5.1)
SODIUM: 138 mmol/L (ref 135–145)
TOTAL PROTEIN: 6.7 g/dL (ref 6.5–8.1)

## 2016-11-27 LAB — I-STAT TROPONIN, ED: Troponin i, poc: 0 ng/mL (ref 0.00–0.08)

## 2016-11-27 LAB — CBC WITH DIFFERENTIAL/PLATELET
BASOS ABS: 0 10*3/uL (ref 0.0–0.1)
BASOS PCT: 0 %
Eosinophils Absolute: 0.1 10*3/uL (ref 0.0–0.7)
Eosinophils Relative: 2 %
HEMATOCRIT: 38.7 % (ref 36.0–46.0)
HEMOGLOBIN: 12.3 g/dL (ref 12.0–15.0)
Lymphocytes Relative: 35 %
Lymphs Abs: 1.6 10*3/uL (ref 0.7–4.0)
MCH: 27.2 pg (ref 26.0–34.0)
MCHC: 31.8 g/dL (ref 30.0–36.0)
MCV: 85.6 fL (ref 78.0–100.0)
Monocytes Absolute: 0.4 10*3/uL (ref 0.1–1.0)
Monocytes Relative: 9 %
NEUTROS ABS: 2.4 10*3/uL (ref 1.7–7.7)
NEUTROS PCT: 54 %
Platelets: 211 10*3/uL (ref 150–400)
RBC: 4.52 MIL/uL (ref 3.87–5.11)
RDW: 15.2 % (ref 11.5–15.5)
WBC: 4.4 10*3/uL (ref 4.0–10.5)

## 2016-11-27 LAB — BRAIN NATRIURETIC PEPTIDE: B NATRIURETIC PEPTIDE 5: 55.5 pg/mL (ref 0.0–100.0)

## 2016-11-27 LAB — I-STAT CG4 LACTIC ACID, ED: Lactic Acid, Venous: 0.9 mmol/L (ref 0.5–1.9)

## 2016-11-27 MED ORDER — ALBUTEROL SULFATE HFA 108 (90 BASE) MCG/ACT IN AERS
2.0000 | INHALATION_SPRAY | RESPIRATORY_TRACT | Status: DC | PRN
  Filled 2016-11-27: qty 6.7

## 2016-11-27 MED ORDER — FLUTICASONE PROPIONATE 50 MCG/ACT NA SUSP: 2.0000 | Freq: Every day | NASAL | 2 refills | Status: DC

## 2016-11-27 MED ORDER — AEROCHAMBER PLUS FLO-VU LARGE MISC
1.0000 | Freq: Once | Status: AC
  Administered 2016-11-27: 1

## 2016-11-27 NOTE — ED Provider Notes (Signed)
[facility]-EMERGENCY DEPT Provider Note   CSN: [id number] Arrival date & time: 11/27/16  0355     History   Chief Complaint Chief Complaint  Patient presents with  . Shortness of Breath    HPI Lindsey Ross is a 83 y.o. female with a hx of HTN, SOB, arthritis presents to the Emergency Department via EMS complaining of gradual, persistent, progressively worsening cough productive of clear sputum onset 5 days ago.  Pt reports she was awoken tonight with SOB, which has improved significantly Within 5 minutes and has completely resolved at this time.  Patient reports this week she has had associated rhinorrhea and postnasal drip. She was seen by PCP earlier this week with clear CXR.  Pt reports fall in January and progresive weakness since that time.  She is a nonsmoker.  Associated symptoms include generalized weakness onset and progressive since January.  Nothing makes it better and nothing makes it worse.  Pt denies fever, chills, chest pain, abd pain, N/V/D, diaphoresis, syncope, near syncope.       The history is provided by the patient and medical records. No language interpreter was used.    Past Medical History:  Diagnosis Date  . Arthritis   . Burning sensation of feet    Bilateral feet  . Frequent urination   . Hypertension   . Shortness of breath dyspnea    with exertion    Patient Active Problem List   Diagnosis Date Noted  . Acute pain of right wrist 08/31/2016  . Acute bilateral low back pain without sciatica 08/31/2016  . Closed fracture distal radius and ulna, right, initial encounter 08/31/2016  . Osteoarthritis of right hip 03/25/2015  . Status post total replacement of right hip 03/25/2015    Past Surgical History:  Procedure Laterality Date  . ABDOMINAL HYSTERECTOMY    . ANKLE FRACTURE SURGERY Right 2003  . CHOLECYSTECTOMY    . EYE SURGERY Bilateral    cataract removal  . TOTAL HIP ARTHROPLASTY Right 03/25/2015   Procedure: RIGHT TOTAL HIP ARTHROPLASTY  ANTERIOR APPROACH;  Surgeon: Kathryne Hitch, MD;  Location: [facility] OR;  Service: Orthopedics;  Laterality: Right;    OB History    No data available       Home Medications    Prior to Admission medications   Medication Sig Start Date End Date Taking? Authorizing Provider  acetaminophen (TYLENOL) 325 MG tablet Take 650 mg by mouth every 8 (eight) hours as needed for mild pain.   Yes Historical Provider, MD  aspirin EC 81 MG tablet Take 81 mg by mouth daily.   Yes Historical Provider, MD  dextromethorphan (DELSYM) 30 MG/5ML liquid Take 30 mg by mouth as needed for cough.   Yes Historical Provider, MD  Multiple Vitamins-Minerals (MULTIVITAMIN WITH MINERALS) tablet Take 1 tablet by mouth daily.   Yes Historical Provider, MD  Ethelda Chick (OYSTER CALCIUM) 500 MG TABS tablet Take 500 mg of elemental calcium by mouth 2 (two) times daily.   Yes Historical Provider, MD  telmisartan-hydrochlorothiazide (MICARDIS HCT) 80-25 MG tablet Take 1 tablet by mouth daily. 09/26/16  Yes Historical Provider, MD  aspirin EC 325 MG EC tablet Take 1 tablet (325 mg total) by mouth 2 (two) times daily after a meal. Patient not taking: Reported on 08/31/2016 03/27/15   Kathryne Hitch, MD  fluticasone [facility]) 50 MCG/ACT nasal spray Place 2 sprays into both nostrils daily. 11/27/16   Dahlia Client Debi Cousin, PA-C    Family History No family history  on file.  Social History Social History  Substance Use Topics  . Smoking status: Never Smoker  . Smokeless tobacco: Never Used  . Alcohol use No     Allergies   Patient has no known allergies.   Review of Systems Review of Systems  Respiratory: Positive for shortness of breath.   All other systems reviewed and are negative.    Physical Exam Updated Vital Signs BP (!) 181/94 (BP Location: Right Arm)   Pulse 83   Temp 98 F (36.7 C) (Oral)   Resp (!) 21   SpO2 97%   Physical Exam  Constitutional: She appears well-developed and well-nourished. No  distress.  Awake, alert, nontoxic appearance  HENT:  Head: Normocephalic and atraumatic.  Right Ear: Tympanic membrane, external ear and ear canal normal.  Left Ear: Tympanic membrane, external ear and ear canal normal.  Nose: Mucosal edema and rhinorrhea present. No epistaxis. Right sinus exhibits no maxillary sinus tenderness and no frontal sinus tenderness. Left sinus exhibits no maxillary sinus tenderness and no frontal sinus tenderness.  Mouth/Throat: Uvula is midline, oropharynx is clear and moist and mucous membranes are normal. Mucous membranes are not pale and not cyanotic. No oropharyngeal exudate, posterior oropharyngeal edema, posterior oropharyngeal erythema or tonsillar abscesses.  Eyes: Conjunctivae are normal. Pupils are equal, round, and reactive to light. No scleral icterus.  Neck: Normal range of motion and full passive range of motion without pain. Neck supple.  Cardiovascular: Normal rate, regular rhythm and intact distal pulses.   Pulmonary/Chest: Effort normal and breath sounds normal. No stridor. No respiratory distress. She has no wheezes.  Equal chest expansion  Abdominal: Soft. Bowel sounds are normal. She exhibits no mass. There is no tenderness. There is no rebound and no guarding.  Musculoskeletal: Normal range of motion. She exhibits no edema.  Lymphadenopathy:    She has no cervical adenopathy.  Neurological: She is alert.  Speech is clear and goal oriented Moves extremities without ataxia  Skin: Skin is warm and dry. No rash noted. She is not diaphoretic.  Psychiatric: She has a normal mood and affect.  Nursing note and vitals reviewed.    ED Treatments / Results  Labs (all labs ordered are listed, but only abnormal results are displayed) Labs Reviewed  COMPREHENSIVE METABOLIC PANEL - Abnormal; Notable for the following:       Result Value   Chloride 100 (*)    Glucose, Bld 126 (*)    BUN 22 (*)    ALT 12 (*)    All other components within normal  limits  CULTURE, BLOOD (ROUTINE X 2)  CULTURE, BLOOD (ROUTINE X 2)  CBC WITH DIFFERENTIAL/PLATELET  BRAIN NATRIURETIC PEPTIDE  I-STAT CG4 LACTIC ACID, ED  Rosezena Sensor, ED   ED ECG REPORT   Date: 11/27/2016  Rate: 86  Rhythm: normal sinus rhythm  QRS Axis: left  Intervals: normal  ST/T Wave abnormalities: nonspecific ST changes  Conduction Disutrbances:Incomplete RBBB and LAFB  Narrative Interpretation: unchanged from 03/18/2015  Old EKG Reviewed: unchanged  I have personally reviewed the EKG tracing and agree with the computerized printout as noted.   Radiology Dg Chest 2 View  Result Date: 11/27/2016 CLINICAL DATA:  Cough for 4 days. Awakened from sleep tonight with dyspnea and cough. EXAM: CHEST  2 VIEW COMPARISON:  12/29/2010 FINDINGS: Moderate hyperinflation, unchanged. No confluent consolidation. Mild chronic appearing interstitial coarsening. Moderate hiatal hernia. No pleural effusion. Multiple lower thoracic and upper lumbar vertebral compressions, some of which are new  from 12/29/2010 but more likely chronic. IMPRESSION: Moderate hyperinflation. No consolidation or effusion. Hiatal hernia. Multiple thoracolumbar vertebral compressions. Electronically Signed   By: Ellery Plunk M.D.   On: 11/27/2016 04:56    Procedures Procedures (including critical care time)  Medications Ordered in ED Medications  albuterol (PROVENTIL HFA;VENTOLIN HFA) 108 (90 Base) MCG/ACT inhaler 2 puff (not administered)  AEROCHAMBER PLUS FLO-VU LARGE MISC 1 each (not administered)     Initial Impression / Assessment and Plan / ED Course  I have reviewed the triage vital signs and the nursing notes.  Pertinent labs & imaging results that were available during my care of the patient were reviewed by me and considered in my medical decision making (see chart for details).  Clinical Course as of Nov 28 622  Sat Nov 27, 2016  1610 The patient was discussed with and seen by Dr. Adela Lank who  agrees with the plan for d/c with albuterol and conservative treatments   [HM]    Clinical Course User Index [HM] Dahlia Client Corgan Mormile, PA-C    Pt presents With episode of shortness of breath tonight. Patient awoke feeling as if she could not catch her breath but this episode had largely resolved by the time EMS arrived. It was completely resolved without intervention of the time she arrived in the emergency department. She has had URI symptoms for the last week with negative chest x-ray by her primary care earlier. She denies fevers or chills. Patient does have rhinorrhea and postnasal drip. Suspect she had phlegm in her throat. Workup here has been reassuring. EKG unchanged from previous, chest x-ray without evidence of pneumonia or pneumothorax. No evidence of pulmonary edema on chest x-ray, no peripheral extremity edema. Normal BNP. Normal troponin. Patient ambulates here in the emergency department without hypoxia. Her generalized weakness has been progressive for many months and she has a nonfocal exam today. She will need outpatient follow-up for this. Will give albuterol, Flonase for symptom management for her URI. Her questions to primary care in 48 hours. Patient states understanding and is in agreement with the plan.  Patient initially with hypertension upon arrival however this has resolved without intervention. BP (!) 147/70   Pulse 79   Temp 98 F (36.7 C) (Oral)   Resp 20   SpO2 91%    Final Clinical Impressions(s) / ED Diagnoses   Final diagnoses:  Shortness of breath  Viral upper respiratory tract infection  Rhinorrhea    New Prescriptions New Prescriptions   FLUTICASONE (FLONASE) 50 MCG/ACT NASAL SPRAY    Place 2 sprays into both nostrils daily.     Dahlia Client Cornie Herrington, PA-C 11/27/16 0630    Melene Plan, DO 11/27/16 [phone]

## 2016-11-27 NOTE — ED Triage Notes (Addendum)
Per EMS pt has had cough for the past 4 days. Tonight awakened from sleep with increasing shortness of breath and cough.  Feels "weak"  Pt had a fall in January and states she hasn't felt as strong since then.

## 2016-11-27 NOTE — ED Notes (Signed)
Pt ambulated from room A9 to room A13 and back while on pulse oximetry. Pt O2 stats remain at 95 and HR stayed at 93. Pt RR is 20. Pt is complaining of sob and tiredness.

## 2016-11-27 NOTE — Discharge Instructions (Signed)
1. Medications: flonase, albuterol, usual home medications 2. Treatment: rest, drink plenty of fluids, take tylenol or ibuprofen for fever control 3. Follow Up: Please followup with your primary doctor in 3 days for discussion of your diagnoses and further evaluation after today's visit; if you do not have a primary care doctor use the resource guide provided to find one; Return to the ER for high fevers, difficulty breathing or other concerning symptoms  

## 2016-11-30 DIAGNOSIS — M545 Low back pain: Secondary | ICD-10-CM | POA: Diagnosis not present

## 2016-11-30 DIAGNOSIS — J069 Acute upper respiratory infection, unspecified: Secondary | ICD-10-CM | POA: Diagnosis not present

## 2016-11-30 DIAGNOSIS — E1122 Type 2 diabetes mellitus with diabetic chronic kidney disease: Secondary | ICD-10-CM | POA: Diagnosis not present

## 2016-11-30 DIAGNOSIS — M5134 Other intervertebral disc degeneration, thoracic region: Secondary | ICD-10-CM | POA: Diagnosis not present

## 2016-11-30 DIAGNOSIS — M1990 Unspecified osteoarthritis, unspecified site: Secondary | ICD-10-CM | POA: Diagnosis not present

## 2016-11-30 DIAGNOSIS — N183 Chronic kidney disease, stage 3 (moderate): Secondary | ICD-10-CM | POA: Diagnosis not present

## 2016-11-30 DIAGNOSIS — I129 Hypertensive chronic kidney disease with stage 1 through stage 4 chronic kidney disease, or unspecified chronic kidney disease: Secondary | ICD-10-CM | POA: Diagnosis not present

## 2016-12-02 DIAGNOSIS — E1122 Type 2 diabetes mellitus with diabetic chronic kidney disease: Secondary | ICD-10-CM | POA: Diagnosis not present

## 2016-12-02 DIAGNOSIS — M1990 Unspecified osteoarthritis, unspecified site: Secondary | ICD-10-CM | POA: Diagnosis not present

## 2016-12-02 DIAGNOSIS — N183 Chronic kidney disease, stage 3 (moderate): Secondary | ICD-10-CM | POA: Diagnosis not present

## 2016-12-02 DIAGNOSIS — M545 Low back pain: Secondary | ICD-10-CM | POA: Diagnosis not present

## 2016-12-02 DIAGNOSIS — I129 Hypertensive chronic kidney disease with stage 1 through stage 4 chronic kidney disease, or unspecified chronic kidney disease: Secondary | ICD-10-CM | POA: Diagnosis not present

## 2016-12-02 DIAGNOSIS — M5134 Other intervertebral disc degeneration, thoracic region: Secondary | ICD-10-CM | POA: Diagnosis not present

## 2016-12-02 LAB — CULTURE, BLOOD (ROUTINE X 2)
CULTURE: NO GROWTH
Culture: NO GROWTH
SPECIAL REQUESTS: ADEQUATE
SPECIAL REQUESTS: ADEQUATE

## 2016-12-05 ENCOUNTER — Other Ambulatory Visit: Payer: Medicare Other

## 2016-12-05 ENCOUNTER — Ambulatory Visit
Admission: RE | Admit: 2016-12-05 | Discharge: 2016-12-05 | Disposition: A | Discharge status: Home / Self Care | Payer: Medicare Other | Source: Ambulatory Visit | Attending: Internal Medicine | Admitting: Internal Medicine

## 2016-12-05 DIAGNOSIS — M5124 Other intervertebral disc displacement, thoracic region: Secondary | ICD-10-CM | POA: Diagnosis not present

## 2016-12-05 DIAGNOSIS — R0781 Pleurodynia: Secondary | ICD-10-CM

## 2016-12-05 DIAGNOSIS — M5134 Other intervertebral disc degeneration, thoracic region: Secondary | ICD-10-CM

## 2016-12-06 DIAGNOSIS — E1122 Type 2 diabetes mellitus with diabetic chronic kidney disease: Secondary | ICD-10-CM | POA: Diagnosis not present

## 2016-12-06 DIAGNOSIS — N183 Chronic kidney disease, stage 3 (moderate): Secondary | ICD-10-CM | POA: Diagnosis not present

## 2016-12-06 DIAGNOSIS — M5134 Other intervertebral disc degeneration, thoracic region: Secondary | ICD-10-CM | POA: Diagnosis not present

## 2016-12-06 DIAGNOSIS — I129 Hypertensive chronic kidney disease with stage 1 through stage 4 chronic kidney disease, or unspecified chronic kidney disease: Secondary | ICD-10-CM | POA: Diagnosis not present

## 2016-12-06 DIAGNOSIS — M545 Low back pain: Secondary | ICD-10-CM | POA: Diagnosis not present

## 2016-12-06 DIAGNOSIS — M1990 Unspecified osteoarthritis, unspecified site: Secondary | ICD-10-CM | POA: Diagnosis not present

## 2016-12-07 DIAGNOSIS — N183 Chronic kidney disease, stage 3 (moderate): Secondary | ICD-10-CM | POA: Diagnosis not present

## 2016-12-07 DIAGNOSIS — E1122 Type 2 diabetes mellitus with diabetic chronic kidney disease: Secondary | ICD-10-CM | POA: Diagnosis not present

## 2016-12-07 DIAGNOSIS — M545 Low back pain: Secondary | ICD-10-CM | POA: Diagnosis not present

## 2016-12-07 DIAGNOSIS — I129 Hypertensive chronic kidney disease with stage 1 through stage 4 chronic kidney disease, or unspecified chronic kidney disease: Secondary | ICD-10-CM | POA: Diagnosis not present

## 2016-12-07 DIAGNOSIS — M1990 Unspecified osteoarthritis, unspecified site: Secondary | ICD-10-CM | POA: Diagnosis not present

## 2016-12-07 DIAGNOSIS — M5134 Other intervertebral disc degeneration, thoracic region: Secondary | ICD-10-CM | POA: Diagnosis not present

## 2016-12-09 DIAGNOSIS — I129 Hypertensive chronic kidney disease with stage 1 through stage 4 chronic kidney disease, or unspecified chronic kidney disease: Secondary | ICD-10-CM | POA: Diagnosis not present

## 2016-12-09 DIAGNOSIS — N183 Chronic kidney disease, stage 3 (moderate): Secondary | ICD-10-CM | POA: Diagnosis not present

## 2016-12-09 DIAGNOSIS — M5134 Other intervertebral disc degeneration, thoracic region: Secondary | ICD-10-CM | POA: Diagnosis not present

## 2016-12-09 DIAGNOSIS — E1122 Type 2 diabetes mellitus with diabetic chronic kidney disease: Secondary | ICD-10-CM | POA: Diagnosis not present

## 2016-12-09 DIAGNOSIS — M1990 Unspecified osteoarthritis, unspecified site: Secondary | ICD-10-CM | POA: Diagnosis not present

## 2016-12-09 DIAGNOSIS — M545 Low back pain: Secondary | ICD-10-CM | POA: Diagnosis not present

## 2016-12-13 DIAGNOSIS — E1122 Type 2 diabetes mellitus with diabetic chronic kidney disease: Secondary | ICD-10-CM | POA: Diagnosis not present

## 2016-12-13 DIAGNOSIS — M1990 Unspecified osteoarthritis, unspecified site: Secondary | ICD-10-CM | POA: Diagnosis not present

## 2016-12-13 DIAGNOSIS — N183 Chronic kidney disease, stage 3 (moderate): Secondary | ICD-10-CM | POA: Diagnosis not present

## 2016-12-13 DIAGNOSIS — M5134 Other intervertebral disc degeneration, thoracic region: Secondary | ICD-10-CM | POA: Diagnosis not present

## 2016-12-13 DIAGNOSIS — M545 Low back pain: Secondary | ICD-10-CM | POA: Diagnosis not present

## 2016-12-13 DIAGNOSIS — I129 Hypertensive chronic kidney disease with stage 1 through stage 4 chronic kidney disease, or unspecified chronic kidney disease: Secondary | ICD-10-CM | POA: Diagnosis not present

## 2016-12-14 DIAGNOSIS — E1122 Type 2 diabetes mellitus with diabetic chronic kidney disease: Secondary | ICD-10-CM | POA: Diagnosis not present

## 2016-12-14 DIAGNOSIS — M1990 Unspecified osteoarthritis, unspecified site: Secondary | ICD-10-CM | POA: Diagnosis not present

## 2016-12-14 DIAGNOSIS — M545 Low back pain: Secondary | ICD-10-CM | POA: Diagnosis not present

## 2016-12-14 DIAGNOSIS — M5134 Other intervertebral disc degeneration, thoracic region: Secondary | ICD-10-CM | POA: Diagnosis not present

## 2016-12-14 DIAGNOSIS — N183 Chronic kidney disease, stage 3 (moderate): Secondary | ICD-10-CM | POA: Diagnosis not present

## 2016-12-14 DIAGNOSIS — I129 Hypertensive chronic kidney disease with stage 1 through stage 4 chronic kidney disease, or unspecified chronic kidney disease: Secondary | ICD-10-CM | POA: Diagnosis not present

## 2016-12-15 DIAGNOSIS — M810 Age-related osteoporosis without current pathological fracture: Secondary | ICD-10-CM | POA: Diagnosis not present

## 2016-12-15 DIAGNOSIS — I129 Hypertensive chronic kidney disease with stage 1 through stage 4 chronic kidney disease, or unspecified chronic kidney disease: Secondary | ICD-10-CM | POA: Diagnosis not present

## 2016-12-15 DIAGNOSIS — M545 Low back pain: Secondary | ICD-10-CM | POA: Diagnosis not present

## 2016-12-15 DIAGNOSIS — M1990 Unspecified osteoarthritis, unspecified site: Secondary | ICD-10-CM | POA: Diagnosis not present

## 2016-12-15 DIAGNOSIS — E1151 Type 2 diabetes mellitus with diabetic peripheral angiopathy without gangrene: Secondary | ICD-10-CM | POA: Diagnosis not present

## 2016-12-15 DIAGNOSIS — N183 Chronic kidney disease, stage 3 (moderate): Secondary | ICD-10-CM | POA: Diagnosis not present

## 2016-12-15 DIAGNOSIS — M5134 Other intervertebral disc degeneration, thoracic region: Secondary | ICD-10-CM | POA: Diagnosis not present

## 2016-12-15 DIAGNOSIS — R8299 Other abnormal findings in urine: Secondary | ICD-10-CM | POA: Diagnosis not present

## 2016-12-15 DIAGNOSIS — N390 Urinary tract infection, site not specified: Secondary | ICD-10-CM | POA: Diagnosis not present

## 2016-12-15 DIAGNOSIS — E1122 Type 2 diabetes mellitus with diabetic chronic kidney disease: Secondary | ICD-10-CM | POA: Diagnosis not present

## 2016-12-15 DIAGNOSIS — E784 Other hyperlipidemia: Secondary | ICD-10-CM | POA: Diagnosis not present

## 2016-12-17 DIAGNOSIS — M545 Low back pain: Secondary | ICD-10-CM | POA: Diagnosis not present

## 2016-12-17 DIAGNOSIS — E1122 Type 2 diabetes mellitus with diabetic chronic kidney disease: Secondary | ICD-10-CM | POA: Diagnosis not present

## 2016-12-17 DIAGNOSIS — I129 Hypertensive chronic kidney disease with stage 1 through stage 4 chronic kidney disease, or unspecified chronic kidney disease: Secondary | ICD-10-CM | POA: Diagnosis not present

## 2016-12-17 DIAGNOSIS — M5134 Other intervertebral disc degeneration, thoracic region: Secondary | ICD-10-CM | POA: Diagnosis not present

## 2016-12-17 DIAGNOSIS — M1990 Unspecified osteoarthritis, unspecified site: Secondary | ICD-10-CM | POA: Diagnosis not present

## 2016-12-17 DIAGNOSIS — N183 Chronic kidney disease, stage 3 (moderate): Secondary | ICD-10-CM | POA: Diagnosis not present

## 2016-12-20 DIAGNOSIS — M545 Low back pain: Secondary | ICD-10-CM | POA: Diagnosis not present

## 2016-12-20 DIAGNOSIS — M5134 Other intervertebral disc degeneration, thoracic region: Secondary | ICD-10-CM | POA: Diagnosis not present

## 2016-12-20 DIAGNOSIS — I129 Hypertensive chronic kidney disease with stage 1 through stage 4 chronic kidney disease, or unspecified chronic kidney disease: Secondary | ICD-10-CM | POA: Diagnosis not present

## 2016-12-20 DIAGNOSIS — E1122 Type 2 diabetes mellitus with diabetic chronic kidney disease: Secondary | ICD-10-CM | POA: Diagnosis not present

## 2016-12-20 DIAGNOSIS — M1990 Unspecified osteoarthritis, unspecified site: Secondary | ICD-10-CM | POA: Diagnosis not present

## 2016-12-20 DIAGNOSIS — N183 Chronic kidney disease, stage 3 (moderate): Secondary | ICD-10-CM | POA: Diagnosis not present

## 2016-12-21 DIAGNOSIS — M1990 Unspecified osteoarthritis, unspecified site: Secondary | ICD-10-CM | POA: Diagnosis not present

## 2016-12-21 DIAGNOSIS — E1122 Type 2 diabetes mellitus with diabetic chronic kidney disease: Secondary | ICD-10-CM | POA: Diagnosis not present

## 2016-12-21 DIAGNOSIS — M5134 Other intervertebral disc degeneration, thoracic region: Secondary | ICD-10-CM | POA: Diagnosis not present

## 2016-12-21 DIAGNOSIS — I129 Hypertensive chronic kidney disease with stage 1 through stage 4 chronic kidney disease, or unspecified chronic kidney disease: Secondary | ICD-10-CM | POA: Diagnosis not present

## 2016-12-21 DIAGNOSIS — N183 Chronic kidney disease, stage 3 (moderate): Secondary | ICD-10-CM | POA: Diagnosis not present

## 2016-12-21 DIAGNOSIS — M545 Low back pain: Secondary | ICD-10-CM | POA: Diagnosis not present

## 2016-12-23 DIAGNOSIS — E1122 Type 2 diabetes mellitus with diabetic chronic kidney disease: Secondary | ICD-10-CM | POA: Diagnosis not present

## 2016-12-23 DIAGNOSIS — N183 Chronic kidney disease, stage 3 (moderate): Secondary | ICD-10-CM | POA: Diagnosis not present

## 2016-12-23 DIAGNOSIS — M5134 Other intervertebral disc degeneration, thoracic region: Secondary | ICD-10-CM | POA: Diagnosis not present

## 2016-12-23 DIAGNOSIS — I129 Hypertensive chronic kidney disease with stage 1 through stage 4 chronic kidney disease, or unspecified chronic kidney disease: Secondary | ICD-10-CM | POA: Diagnosis not present

## 2016-12-23 DIAGNOSIS — M545 Low back pain: Secondary | ICD-10-CM | POA: Diagnosis not present

## 2016-12-23 DIAGNOSIS — M1990 Unspecified osteoarthritis, unspecified site: Secondary | ICD-10-CM | POA: Diagnosis not present

## 2016-12-24 DIAGNOSIS — K862 Cyst of pancreas: Secondary | ICD-10-CM | POA: Diagnosis not present

## 2016-12-24 DIAGNOSIS — M810 Age-related osteoporosis without current pathological fracture: Secondary | ICD-10-CM | POA: Diagnosis not present

## 2016-12-24 DIAGNOSIS — Z1389 Encounter for screening for other disorder: Secondary | ICD-10-CM | POA: Diagnosis not present

## 2016-12-24 DIAGNOSIS — I7090 Unspecified atherosclerosis: Secondary | ICD-10-CM | POA: Diagnosis not present

## 2016-12-24 DIAGNOSIS — Z0000 Encounter for general adult medical examination without abnormal findings: Secondary | ICD-10-CM | POA: Diagnosis not present

## 2016-12-24 DIAGNOSIS — Z6832 Body mass index (BMI) 32.0-32.9, adult: Secondary | ICD-10-CM | POA: Diagnosis not present

## 2016-12-24 DIAGNOSIS — E1151 Type 2 diabetes mellitus with diabetic peripheral angiopathy without gangrene: Secondary | ICD-10-CM | POA: Diagnosis not present

## 2016-12-24 DIAGNOSIS — I10 Essential (primary) hypertension: Secondary | ICD-10-CM | POA: Diagnosis not present

## 2016-12-24 DIAGNOSIS — T1490XA Injury, unspecified, initial encounter: Secondary | ICD-10-CM | POA: Insufficient documentation

## 2016-12-24 DIAGNOSIS — E784 Other hyperlipidemia: Secondary | ICD-10-CM | POA: Diagnosis not present

## 2016-12-24 DIAGNOSIS — N390 Urinary tract infection, site not specified: Secondary | ICD-10-CM | POA: Diagnosis not present

## 2016-12-24 DIAGNOSIS — S42309S Unspecified fracture of shaft of humerus, unspecified arm, sequela: Secondary | ICD-10-CM | POA: Insufficient documentation

## 2016-12-24 DIAGNOSIS — I452 Bifascicular block: Secondary | ICD-10-CM | POA: Diagnosis not present

## 2016-12-24 DIAGNOSIS — N183 Chronic kidney disease, stage 3 (moderate): Secondary | ICD-10-CM | POA: Diagnosis not present

## 2016-12-27 DIAGNOSIS — N183 Chronic kidney disease, stage 3 (moderate): Secondary | ICD-10-CM | POA: Diagnosis not present

## 2016-12-27 DIAGNOSIS — M545 Low back pain: Secondary | ICD-10-CM | POA: Diagnosis not present

## 2016-12-27 DIAGNOSIS — Z1212 Encounter for screening for malignant neoplasm of rectum: Secondary | ICD-10-CM | POA: Diagnosis not present

## 2016-12-27 DIAGNOSIS — M1990 Unspecified osteoarthritis, unspecified site: Secondary | ICD-10-CM | POA: Diagnosis not present

## 2016-12-27 DIAGNOSIS — E1122 Type 2 diabetes mellitus with diabetic chronic kidney disease: Secondary | ICD-10-CM | POA: Diagnosis not present

## 2016-12-27 DIAGNOSIS — M5134 Other intervertebral disc degeneration, thoracic region: Secondary | ICD-10-CM | POA: Diagnosis not present

## 2016-12-27 DIAGNOSIS — I129 Hypertensive chronic kidney disease with stage 1 through stage 4 chronic kidney disease, or unspecified chronic kidney disease: Secondary | ICD-10-CM | POA: Diagnosis not present

## 2016-12-28 DIAGNOSIS — N183 Chronic kidney disease, stage 3 (moderate): Secondary | ICD-10-CM | POA: Diagnosis not present

## 2016-12-28 DIAGNOSIS — I129 Hypertensive chronic kidney disease with stage 1 through stage 4 chronic kidney disease, or unspecified chronic kidney disease: Secondary | ICD-10-CM | POA: Diagnosis not present

## 2016-12-28 DIAGNOSIS — E1122 Type 2 diabetes mellitus with diabetic chronic kidney disease: Secondary | ICD-10-CM | POA: Diagnosis not present

## 2016-12-28 DIAGNOSIS — M1990 Unspecified osteoarthritis, unspecified site: Secondary | ICD-10-CM | POA: Diagnosis not present

## 2016-12-28 DIAGNOSIS — M545 Low back pain: Secondary | ICD-10-CM | POA: Diagnosis not present

## 2016-12-28 DIAGNOSIS — M5134 Other intervertebral disc degeneration, thoracic region: Secondary | ICD-10-CM | POA: Diagnosis not present

## 2016-12-29 DIAGNOSIS — M5134 Other intervertebral disc degeneration, thoracic region: Secondary | ICD-10-CM | POA: Diagnosis not present

## 2016-12-29 DIAGNOSIS — N183 Chronic kidney disease, stage 3 (moderate): Secondary | ICD-10-CM | POA: Diagnosis not present

## 2016-12-29 DIAGNOSIS — E1122 Type 2 diabetes mellitus with diabetic chronic kidney disease: Secondary | ICD-10-CM | POA: Diagnosis not present

## 2016-12-29 DIAGNOSIS — M545 Low back pain: Secondary | ICD-10-CM | POA: Diagnosis not present

## 2016-12-29 DIAGNOSIS — M1990 Unspecified osteoarthritis, unspecified site: Secondary | ICD-10-CM | POA: Diagnosis not present

## 2016-12-29 DIAGNOSIS — I129 Hypertensive chronic kidney disease with stage 1 through stage 4 chronic kidney disease, or unspecified chronic kidney disease: Secondary | ICD-10-CM | POA: Diagnosis not present

## 2017-01-03 DIAGNOSIS — I129 Hypertensive chronic kidney disease with stage 1 through stage 4 chronic kidney disease, or unspecified chronic kidney disease: Secondary | ICD-10-CM | POA: Diagnosis not present

## 2017-01-03 DIAGNOSIS — N183 Chronic kidney disease, stage 3 (moderate): Secondary | ICD-10-CM | POA: Diagnosis not present

## 2017-01-03 DIAGNOSIS — M1990 Unspecified osteoarthritis, unspecified site: Secondary | ICD-10-CM | POA: Diagnosis not present

## 2017-01-03 DIAGNOSIS — M545 Low back pain: Secondary | ICD-10-CM | POA: Diagnosis not present

## 2017-01-03 DIAGNOSIS — M5134 Other intervertebral disc degeneration, thoracic region: Secondary | ICD-10-CM | POA: Diagnosis not present

## 2017-01-03 DIAGNOSIS — E1122 Type 2 diabetes mellitus with diabetic chronic kidney disease: Secondary | ICD-10-CM | POA: Diagnosis not present

## 2017-01-05 DIAGNOSIS — N183 Chronic kidney disease, stage 3 (moderate): Secondary | ICD-10-CM | POA: Diagnosis not present

## 2017-01-05 DIAGNOSIS — E1122 Type 2 diabetes mellitus with diabetic chronic kidney disease: Secondary | ICD-10-CM | POA: Diagnosis not present

## 2017-01-05 DIAGNOSIS — I129 Hypertensive chronic kidney disease with stage 1 through stage 4 chronic kidney disease, or unspecified chronic kidney disease: Secondary | ICD-10-CM | POA: Diagnosis not present

## 2017-01-05 DIAGNOSIS — M545 Low back pain: Secondary | ICD-10-CM | POA: Diagnosis not present

## 2017-01-05 DIAGNOSIS — M5134 Other intervertebral disc degeneration, thoracic region: Secondary | ICD-10-CM | POA: Diagnosis not present

## 2017-01-05 DIAGNOSIS — M1990 Unspecified osteoarthritis, unspecified site: Secondary | ICD-10-CM | POA: Diagnosis not present

## 2017-01-06 DIAGNOSIS — M5134 Other intervertebral disc degeneration, thoracic region: Secondary | ICD-10-CM | POA: Diagnosis not present

## 2017-01-06 DIAGNOSIS — N183 Chronic kidney disease, stage 3 (moderate): Secondary | ICD-10-CM | POA: Diagnosis not present

## 2017-01-06 DIAGNOSIS — E1122 Type 2 diabetes mellitus with diabetic chronic kidney disease: Secondary | ICD-10-CM | POA: Diagnosis not present

## 2017-01-06 DIAGNOSIS — M1990 Unspecified osteoarthritis, unspecified site: Secondary | ICD-10-CM | POA: Diagnosis not present

## 2017-01-06 DIAGNOSIS — I129 Hypertensive chronic kidney disease with stage 1 through stage 4 chronic kidney disease, or unspecified chronic kidney disease: Secondary | ICD-10-CM | POA: Diagnosis not present

## 2017-01-06 DIAGNOSIS — M545 Low back pain: Secondary | ICD-10-CM | POA: Diagnosis not present

## 2017-01-11 DIAGNOSIS — Z1212 Encounter for screening for malignant neoplasm of rectum: Secondary | ICD-10-CM | POA: Diagnosis not present

## 2017-01-26 ENCOUNTER — Other Ambulatory Visit: Payer: Self-pay | Admitting: Internal Medicine

## 2017-01-26 DIAGNOSIS — K862 Cyst of pancreas: Secondary | ICD-10-CM

## 2017-01-26 DIAGNOSIS — K863 Pseudocyst of pancreas: Principal | ICD-10-CM

## 2017-01-28 ENCOUNTER — Ambulatory Visit
Admission: RE | Admit: 2017-01-28 | Discharge: 2017-01-28 | Disposition: A | Discharge status: Home / Self Care | Payer: Medicare Other | Source: Ambulatory Visit | Attending: Internal Medicine | Admitting: Internal Medicine

## 2017-01-28 DIAGNOSIS — K862 Cyst of pancreas: Secondary | ICD-10-CM | POA: Diagnosis not present

## 2017-01-28 DIAGNOSIS — K863 Pseudocyst of pancreas: Principal | ICD-10-CM

## 2017-01-28 MED ORDER — IOPAMIDOL (ISOVUE-300) INJECTION 61%
100.0000 mL | Freq: Once | INTRAVENOUS | Status: AC | PRN
  Administered 2017-01-28: 100 mL via INTRAVENOUS

## 2017-05-10 DIAGNOSIS — E1151 Type 2 diabetes mellitus with diabetic peripheral angiopathy without gangrene: Secondary | ICD-10-CM | POA: Diagnosis not present

## 2017-05-10 DIAGNOSIS — I7090 Unspecified atherosclerosis: Secondary | ICD-10-CM | POA: Diagnosis not present

## 2017-05-10 DIAGNOSIS — I10 Essential (primary) hypertension: Secondary | ICD-10-CM | POA: Diagnosis not present

## 2017-05-10 DIAGNOSIS — R0600 Dyspnea, unspecified: Secondary | ICD-10-CM | POA: Diagnosis not present

## 2017-05-10 DIAGNOSIS — Z6831 Body mass index (BMI) 31.0-31.9, adult: Secondary | ICD-10-CM | POA: Diagnosis not present

## 2017-05-10 DIAGNOSIS — K862 Cyst of pancreas: Secondary | ICD-10-CM | POA: Diagnosis not present

## 2017-06-20 DIAGNOSIS — R42 Dizziness and giddiness: Secondary | ICD-10-CM | POA: Diagnosis not present

## 2017-06-20 DIAGNOSIS — R0602 Shortness of breath: Secondary | ICD-10-CM | POA: Diagnosis not present

## 2017-06-20 DIAGNOSIS — R05 Cough: Secondary | ICD-10-CM | POA: Diagnosis not present

## 2017-06-20 DIAGNOSIS — R0600 Dyspnea, unspecified: Secondary | ICD-10-CM | POA: Diagnosis not present

## 2017-06-20 DIAGNOSIS — M549 Dorsalgia, unspecified: Secondary | ICD-10-CM | POA: Diagnosis not present

## 2017-06-20 DIAGNOSIS — Z6832 Body mass index (BMI) 32.0-32.9, adult: Secondary | ICD-10-CM | POA: Diagnosis not present

## 2017-06-23 ENCOUNTER — Ambulatory Visit (INDEPENDENT_AMBULATORY_CARE_PROVIDER_SITE_OTHER): Payer: Medicare Other | Admitting: Cardiology

## 2017-06-23 ENCOUNTER — Encounter: Payer: Self-pay | Admitting: Cardiology

## 2017-06-23 VITALS — BP 142/58 | HR 45 | Resp 20 | Ht 64.0 in | Wt 174.1 lb

## 2017-06-23 DIAGNOSIS — I10 Essential (primary) hypertension: Secondary | ICD-10-CM | POA: Diagnosis not present

## 2017-06-23 DIAGNOSIS — I498 Other specified cardiac arrhythmias: Secondary | ICD-10-CM

## 2017-06-23 DIAGNOSIS — R0602 Shortness of breath: Secondary | ICD-10-CM | POA: Diagnosis not present

## 2017-06-23 HISTORY — DX: Essential (primary) hypertension: I10

## 2017-06-23 HISTORY — DX: Other specified cardiac arrhythmias: I49.8

## 2017-06-23 HISTORY — DX: Shortness of breath: R06.02

## 2017-06-23 NOTE — Patient Instructions (Signed)
Medication Instructions:  Your physician recommends that you continue on your current medications as directed. Please refer to the Current Medication list given to you today.   Labwork: Your physician recommends that you have D-dimer today  Testing/Procedures: Your physician has requested that you have an echocardiogram. Echocardiography is a painless test that uses sound waves to create images of your heart. It provides your doctor with information about the size and shape of your heart and how well your heart's chambers and valves are working. This procedure takes approximately one hour. There are no restrictions for this procedure.  Your physician has recommended that you wear a holter monitor. Holter monitors are medical devices that record the heart's electrical activity. Doctors most often use these monitors to diagnose arrhythmias. Arrhythmias are problems with the speed or rhythm of the heartbeat. The monitor is a small, portable device. You can wear one while you do your normal daily activities. This is usually used to diagnose what is causing palpitations/syncope (passing out).    Follow-Up: Your physician recommends that you schedule a follow-up appointment in: 1 week   Any Other Special Instructions Will Be Listed Below (If Applicable).     If you need a refill on your cardiac medications before your next appointment, please call your pharmacy.   [facility]  Garey HamAshley A, RN, BSN   Echocardiogram An echocardiogram, or echocardiography, uses sound waves (ultrasound) to produce an image of your heart. The echocardiogram is simple, painless, obtained within a short period of time, and offers valuable information to your health care provider. The images from an echocardiogram can provide information such as:  Evidence of coronary artery disease (CAD).  Heart size.  Heart muscle function.  Heart valve function.  Aneurysm detection.  Evidence of a past heart  attack.  Fluid buildup around the heart.  Heart muscle thickening.  Assess heart valve function.  Tell a health care provider about:  Any allergies you have.  All medicines you are taking, including vitamins, herbs, eye drops, creams, and over-the-counter medicines.  Any problems you or family members have had with anesthetic medicines.  Any blood disorders you have.  Any surgeries you have had.  Any medical conditions you have.  Whether you are pregnant or may be pregnant. What happens before the procedure? No special preparation is needed. Eat and drink normally. What happens during the procedure?  In order to produce an image of your heart, gel will be applied to your chest and a wand-like tool (transducer) will be moved over your chest. The gel will help transmit the sound waves from the transducer. The sound waves will harmlessly bounce off your heart to allow the heart images to be captured in real-time motion. These images will then be recorded.  You may need an IV to receive a medicine that improves the quality of the pictures. What happens after the procedure? You may return to your normal schedule including diet, activities, and medicines, unless your health care provider tells you otherwise. This information is not intended to replace advice given to you by your health care provider. Make sure you discuss any questions you have with your health care provider. Document Released: 08/06/2000 Document Revised: 03/27/2016 Document Reviewed: 04/16/2013 Elsevier Interactive Patient Education  2017 Else[employer] Inc.  Holter Monitoring A Holter monitor is a small device that is used to detect abnormal heart rhythms. It clips to your clothing and is connected by wires to flat, sticky disks (electrodes) that attach to your chest.  It is worn continuously for 24-48 hours. Follow these instructions at home:  Wear your Holter monitor at all times, even while exercising and sleeping,  for as long as directed by your health care provider.  Make sure that the Holter monitor is safely clipped to your clothing or close to your body as recommended by your health care provider.  Do not get the monitor or wires wet.  Do not put body lotion or moisturizer on your chest.  Keep your skin clean.  Keep a diary of your daily activities, such as walking and doing chores. If you feel that your heartbeat is abnormal or that your heart is fluttering or skipping a beat: ? Record what you are doing when it happens. ? Record what time of day the symptoms occur.  Return your Holter monitor as directed by your health care provider.  Keep all follow-up visits as directed by your health care provider. This is important. Get help right away if:  You feel lightheaded or you faint.  You have trouble breathing.  You feel pain in your chest, upper arm, or jaw.  You feel sick to your stomach and your skin is pale, cool, or damp.  You heartbeat feels unusual or abnormal. This information is not intended to replace advice given to you by your health care provider. Make sure you discuss any questions you have with your health care provider. Document Released: 05/07/2004 Document Revised: 01/15/2016 Document Reviewed: 03/18/2014 Elsevier Interactive Patient Education  [employer].

## 2017-06-23 NOTE — Progress Notes (Signed)
Cardiology Office Note:    Date:  06/23/2017   ID:  Lindsey Ross, DOB 10/09/1924, MRN [id number]  PCP:  Lindsey Greathouse, MD  Cardiologist:  Lindsey Brothers, MD   Referring MD: Lindsey Greathouse, MD    ASSESSMENT:    1. SOB (shortness of breath) on exertion   2. Shortness of breath   3. Bradyarrhythmia   4. Essential hypertension    PLAN:    In order of problems listed above:  1. I discussed my findings with the patient at extensive length. Interestingly her blood work including renal function, TSH and BNP is unremarkable. I'm not convinced that she has any element of congestive heart failure. Her physical examination also ors lastly unremarkable and her lungs are clear. 2. I got lab work including TSH and BNP from her primary care physician office and is unremarkable. 3. Her EKG reveals sinus rhythm with blocked PACs. 4. She is bradycardic and we have ordered a 48-hour Holter monitoring. Echocardiogram will be done to assess murmur heard on auscultation. In view of her shortness of breath on exertion I would like to get a d-dimer to see if here is any element of pulmonary thromboembolism. 5. She will be seen in follow-up appointment in a week or earlier if she has any concerns. She knows to go to the nearest emergency room for any significant concerns.   Medication Adjustments/Labs and Tests Ordered: Current medicines are reviewed at length with the patient today.  Concerns regarding medicines are outlined above.  Orders Placed This Encounter  Procedures  . D-Dimer, Quantitative  . Holter monitor - 48 hour  . EKG 12-Lead  . ECHOCARDIOGRAM COMPLETE   No orders of the defined types were placed in this encounter.    History of Present Illness:    Lindsey Ross is a 83 y.o. female who is being seen today for the evaluation of shortness of breath at the request of Avva, Ravisankar, MD. Patient is a pleasant 82 year old female.She is a very intelligent woman and very  sharp for her age. She says she recently had a fall several months ago and subsequently she's not chest pain the same. She complains of significant shortness of breath on exertion for which sh referred to [place]. She has no history of smoking. She denies any chest pain orthopnea or PND. She says she has some pain in the back when she exerts. At the time of my evaluation she is alert awake oriented and in no distress. Her shortness of breath is consistently on exertion. She also has been told to have bradycardia by her primary care physician and she is here for that evaluation 2.  Past Medical History:  Diagnosis Date  . Arthritis   . Burning sensation of feet    Bilateral feet  . Frequent urination   . Hypertension   . Shortness of breath dyspnea    with exertion    Past Surgical History:  Procedure Laterality Date  . ABDOMINAL HYSTERECTOMY    . ANKLE FRACTURE SURGERY Right 2003  . CHOLECYSTECTOMY    . EYE SURGERY Bilateral    cataract removal  . TOTAL HIP ARTHROPLASTY Right 03/25/2015   Procedure: RIGHT TOTAL HIP ARTHROPLASTY ANTERIOR APPROACH;  Surgeon: Kathryne Hitch, MD;  Location: [facility] OR;  Service: Orthopedics;  Laterality: Right;    Current Medications: Current Meds  Medication Sig  . acetaminophen (TYLENOL) 325 MG tablet Take 650 mg by mouth every 8 (eight) hours as needed for  mild pain.  Marland Kitchen. albuterol (PROVENTIL HFA;VENTOLIN HFA) 108 (90 Base) MCG/ACT inhaler Inhale 1-2 puffs into the lungs every 6 (six) hours as needed for wheezing or shortness of breath.  Marland Kitchen. aspirin EC 81 MG tablet Take 81 mg by mouth daily.  Marland Kitchen. gabapentin (NEURONTIN) 100 MG capsule Take 200 mg by mouth 3 (three) times daily.  . Multiple Vitamins-Minerals (MULTIVITAMIN WITH MINERALS) tablet Take 1 tablet by mouth daily.  Ethelda Chick. Oyster Shell (OYSTER CALCIUM) 500 MG TABS tablet Take 500 mg of elemental calcium by mouth 2 (two) times daily.  Marland Kitchen. telmisartan-hydrochlorothiazide (MICARDIS HCT) 80-25 MG tablet Take 1  tablet by mouth daily.     Allergies:   Patient has no known allergies.   Social History   Social History  . Marital status: Married    Spouse name: N/A  . Number of children: N/A  . Years of education: N/A   Social History Main Topics  . Smoking status: Never Smoker  . Smokeless tobacco: Never Used  . Alcohol use No  . Drug use: No  . Sexual activity: Not Asked   Other Topics Concern  . None   Social History Narrative  . None     Family History: The patient's family history is not on file.  ROS:   Please see the history of present illness.    All other systems reviewed and are negative.  EKGs/Labs/Other Studies Reviewed:    The following studies were reviewed today: I reviewed records from primary care physician.   Recent Labs: 11/27/2016: ALT 12; B Natriuretic Peptide 55.5; BUN 22; Creatinine, Ser 0.62; Hemoglobin 12.3; Platelets 211; Potassium 3.7; Sodium 138  Recent Lipid Panel No results found for: CHOL, TRIG, HDL, CHOLHDL, VLDL, LDLCALC, LDLDIRECT  Physical Exam:    VS:  BP (!) 142/58 (BP Location: Left Arm, Patient Position: Sitting)   Pulse (!) 45   Resp 20   Ht 5\' 4"  (1.626 m)   Wt 174 lb 1.3 oz (79 kg)   SpO2 93%   BMI 29.88 kg/m     Wt Readings from Last 3 Encounters:  06/23/17 174 lb 1.3 oz (79 kg)  08/31/16 184 lb 3.2 oz (83.6 kg)  04/04/15 183 lb 3.2 oz (83.1 kg)     GEN: Patient is in no acute distress HEENT: Normal NECK: No JVD; No carotid bruits LYMPHATICS: No lymphadenopathy CARDIAC: S1 S2 regular, 2/6 systolic murmur at the apex. RESPIRATORY:  Clear to auscultation without rales, wheezing or rhonchi  ABDOMEN: Soft, non-tender, non-distended MUSCULOSKELETAL:  No edema; No deformity  SKIN: Warm and dry NEUROLOGIC:  Alert and oriented x 3 PSYCHIATRIC:  Normal affect    Signed, Lindsey Brothersajan R Vihan Santagata, MD  06/23/2017 11:32 AM    [facility]

## 2017-06-24 ENCOUNTER — Emergency Department: Payer: Medicare Other

## 2017-06-24 ENCOUNTER — Inpatient Hospital Stay (HOSPITAL_BASED_OUTPATIENT_CLINIC_OR_DEPARTMENT_OTHER)
Admission: EM | Admit: 2017-06-24 | Discharge: 2017-06-28 | DRG: 176 | Disposition: A | Discharge status: Home / Self Care | Payer: Medicare Other | Attending: Family Medicine | Admitting: Family Medicine

## 2017-06-24 ENCOUNTER — Encounter (HOSPITAL_BASED_OUTPATIENT_CLINIC_OR_DEPARTMENT_OTHER): Payer: Self-pay | Admitting: *Deleted

## 2017-06-24 ENCOUNTER — Ambulatory Visit (HOSPITAL_BASED_OUTPATIENT_CLINIC_OR_DEPARTMENT_OTHER)
Admission: RE | Admit: 2017-06-24 | Discharge: 2017-06-24 | Disposition: A | Discharge status: Home / Self Care | Payer: Medicare Other | Source: Ambulatory Visit | Attending: Cardiology | Admitting: Cardiology

## 2017-06-24 ENCOUNTER — Other Ambulatory Visit: Payer: Self-pay

## 2017-06-24 DIAGNOSIS — Z23 Encounter for immunization: Secondary | ICD-10-CM | POA: Diagnosis not present

## 2017-06-24 DIAGNOSIS — R001 Bradycardia, unspecified: Secondary | ICD-10-CM | POA: Diagnosis not present

## 2017-06-24 DIAGNOSIS — H9190 Unspecified hearing loss, unspecified ear: Secondary | ICD-10-CM | POA: Diagnosis present

## 2017-06-24 DIAGNOSIS — I2782 Chronic pulmonary embolism: Secondary | ICD-10-CM | POA: Diagnosis not present

## 2017-06-24 DIAGNOSIS — Z7982 Long term (current) use of aspirin: Secondary | ICD-10-CM | POA: Diagnosis not present

## 2017-06-24 DIAGNOSIS — I700 Atherosclerosis of aorta: Secondary | ICD-10-CM | POA: Diagnosis present

## 2017-06-24 DIAGNOSIS — I82442 Acute embolism and thrombosis of left tibial vein: Secondary | ICD-10-CM | POA: Diagnosis not present

## 2017-06-24 DIAGNOSIS — I272 Pulmonary hypertension, unspecified: Secondary | ICD-10-CM | POA: Diagnosis present

## 2017-06-24 DIAGNOSIS — I441 Atrioventricular block, second degree: Secondary | ICD-10-CM | POA: Diagnosis not present

## 2017-06-24 DIAGNOSIS — I82409 Acute embolism and thrombosis of unspecified deep veins of unspecified lower extremity: Secondary | ICD-10-CM

## 2017-06-24 DIAGNOSIS — Z96641 Presence of right artificial hip joint: Secondary | ICD-10-CM | POA: Diagnosis present

## 2017-06-24 DIAGNOSIS — R7989 Other specified abnormal findings of blood chemistry: Secondary | ICD-10-CM

## 2017-06-24 DIAGNOSIS — R0602 Shortness of breath: Secondary | ICD-10-CM | POA: Diagnosis not present

## 2017-06-24 DIAGNOSIS — I340 Nonrheumatic mitral (valve) insufficiency: Secondary | ICD-10-CM | POA: Diagnosis not present

## 2017-06-24 DIAGNOSIS — I2699 Other pulmonary embolism without acute cor pulmonale: Principal | ICD-10-CM

## 2017-06-24 DIAGNOSIS — Z79899 Other long term (current) drug therapy: Secondary | ICD-10-CM

## 2017-06-24 DIAGNOSIS — I10 Essential (primary) hypertension: Secondary | ICD-10-CM | POA: Diagnosis present

## 2017-06-24 DIAGNOSIS — I2609 Other pulmonary embolism with acute cor pulmonale: Secondary | ICD-10-CM | POA: Diagnosis not present

## 2017-06-24 HISTORY — DX: Other pulmonary embolism without acute cor pulmonale: I26.99

## 2017-06-24 HISTORY — DX: Atrioventricular block, second degree: I44.1

## 2017-06-24 HISTORY — DX: Atherosclerosis of aorta: I70.0

## 2017-06-24 HISTORY — DX: Acute embolism and thrombosis of left tibial vein: I82.442

## 2017-06-24 HISTORY — DX: Acute embolism and thrombosis of unspecified deep veins of unspecified lower extremity: I82.409

## 2017-06-24 LAB — CBC WITH DIFFERENTIAL/PLATELET
BASOS ABS: 0 10*3/uL (ref 0.0–0.1)
Basophils Relative: 0 %
EOS ABS: 0.2 10*3/uL (ref 0.0–0.7)
EOS PCT: 3 %
HCT: 37.1 % (ref 36.0–46.0)
HEMOGLOBIN: 11.9 g/dL — AB (ref 12.0–15.0)
LYMPHS ABS: 2 10*3/uL (ref 0.7–4.0)
LYMPHS PCT: 30 %
MCH: 26.9 pg (ref 26.0–34.0)
MCHC: 32.1 g/dL (ref 30.0–36.0)
MCV: 83.9 fL (ref 78.0–100.0)
Monocytes Absolute: 0.7 10*3/uL (ref 0.1–1.0)
Monocytes Relative: 10 %
NEUTROS PCT: 57 %
Neutro Abs: 3.8 10*3/uL (ref 1.7–7.7)
PLATELETS: 238 10*3/uL (ref 150–400)
RBC: 4.42 MIL/uL (ref 3.87–5.11)
RDW: 15 % (ref 11.5–15.5)
WBC: 6.7 10*3/uL (ref 4.0–10.5)

## 2017-06-24 LAB — TSH: TSH: 2.775 u[IU]/mL (ref 0.350–4.500)

## 2017-06-24 LAB — HEPARIN LEVEL (UNFRACTIONATED): Heparin Unfractionated: 0.79 IU/mL — ABNORMAL HIGH (ref 0.30–0.70)

## 2017-06-24 LAB — PROTIME-INR
INR: 1.03
Prothrombin Time: 13.4 seconds (ref 11.4–15.2)

## 2017-06-24 LAB — COMPREHENSIVE METABOLIC PANEL
ALBUMIN: 4.2 g/dL (ref 3.5–5.0)
ALK PHOS: 47 U/L (ref 38–126)
ALT: 12 U/L — ABNORMAL LOW (ref 14–54)
ANION GAP: 7 (ref 5–15)
AST: 22 U/L (ref 15–41)
BUN: 30 mg/dL — AB (ref 6–20)
CO2: 27 mmol/L (ref 22–32)
CREATININE: 0.83 mg/dL (ref 0.44–1.00)
Calcium: 9.9 mg/dL (ref 8.9–10.3)
Chloride: 103 mmol/L (ref 101–111)
GFR calc Af Amer: >60 mL/min (ref >60)
GFR calc non Af Amer: >60 mL/min (ref >60)
GLUCOSE: 119 mg/dL — AB (ref 65–99)
POTASSIUM: 4.6 mmol/L (ref 3.5–5.1)
SODIUM: 137 mmol/L (ref 135–145)
TOTAL PROTEIN: 7.9 g/dL (ref 6.5–8.1)
Total Bilirubin: 0.7 mg/dL (ref 0.3–1.2)

## 2017-06-24 LAB — TROPONIN I

## 2017-06-24 LAB — BRAIN NATRIURETIC PEPTIDE: B NATRIURETIC PEPTIDE 5: 342.1 pg/mL — AB (ref 0.0–100.0)

## 2017-06-24 LAB — D-DIMER, QUANTITATIVE: D-DIMER: 3.5 mg/L FEU — ABNORMAL HIGH (ref 0.00–0.49)

## 2017-06-24 MED ORDER — SODIUM CHLORIDE 0.9 % IV SOLN
INTRAVENOUS | Status: DC
  Administered 2017-06-24 – 2017-06-25 (×2): via INTRAVENOUS

## 2017-06-24 MED ORDER — ALBUTEROL SULFATE (2.5 MG/3ML) 0.083% IN NEBU: 2.5000 mg | INHALATION_SOLUTION | Freq: Four times a day (QID) | RESPIRATORY_TRACT | Status: DC | PRN

## 2017-06-24 MED ORDER — HYDRALAZINE HCL 20 MG/ML IJ SOLN: 5.0000 mg | INTRAMUSCULAR | Status: DC | PRN

## 2017-06-24 MED ORDER — IOPAMIDOL (ISOVUE-370) INJECTION 76%
100.0000 mL | Freq: Once | INTRAVENOUS | Status: AC | PRN
  Administered 2017-06-24: 100 mL via INTRAVENOUS

## 2017-06-24 MED ORDER — HEPARIN (PORCINE) IN NACL 100-0.45 UNIT/ML-% IJ SOLN
1100.0000 [IU]/h | INTRAMUSCULAR | Status: DC
  Administered 2017-06-24: 1200 [IU]/h via INTRAVENOUS
  Administered 2017-06-25: 1100 [IU]/h via INTRAVENOUS
  Filled 2017-06-24 (×2): qty 250

## 2017-06-24 MED ORDER — ACETAMINOPHEN 325 MG PO TABS: 650.0000 mg | ORAL_TABLET | Freq: Four times a day (QID) | ORAL | Status: DC | PRN

## 2017-06-24 MED ORDER — HEPARIN BOLUS VIA INFUSION
4000.0000 [IU] | Freq: Once | INTRAVENOUS | Status: AC
  Administered 2017-06-24: 4000 [IU] via INTRAVENOUS

## 2017-06-24 MED ORDER — ACETAMINOPHEN 650 MG RE SUPP: 650.0000 mg | Freq: Four times a day (QID) | RECTAL | Status: DC | PRN

## 2017-06-24 MED ORDER — IRBESARTAN 300 MG PO TABS
300.0000 mg | ORAL_TABLET | Freq: Every day | ORAL | Status: DC
  Administered 2017-06-25 – 2017-06-28 (×4): 300 mg via ORAL
  Filled 2017-06-24 (×4): qty 1

## 2017-06-24 MED ORDER — ASPIRIN EC 81 MG PO TBEC
81.0000 mg | DELAYED_RELEASE_TABLET | Freq: Every day | ORAL | Status: DC
  Administered 2017-06-25: 81 mg via ORAL
  Filled 2017-06-24: qty 1

## 2017-06-24 NOTE — H&P (Addendum)
History and Physical    Lindsey JonesRose B Herrada NFA:[id number] DOB: 07/29/1925 DOA: 06/24/2017  PCP: Chilton GreathouseAvva, Ravisankar, MD  Patient coming from: Home.  Chief Complaint: Shortness of breath.  HPI: Lindsey Ross is a 83 y.o. female with history of hypertension has been experiencing exertional shortness of breath over the last 2 weeks.  Patient also has been having some upper back pain.  Denies any fever chills productive cough.  Patient had gone to her cardiologist yesterday for further assessment.  D-dimer was done which was positive and patient had CT angiogram of the chest done which was positive for pulmonary embolism with right ventricular strain.  Patient was referred to the ER.  ED Course: In the ER patient's blood pressure was elevated.  Mildly tachypneic.  Patient was started on heparin infusion.  Patient's heart rate was found to be around 40s with EKG showing second-degree AV block type II Mobitz.  At the time of my examination patient is not in distress.  Review of Systems: As per HPI, rest all negative.   Past Medical History:  Diagnosis Date  . Arthritis   . Burning sensation of feet    Bilateral feet  . Frequent urination   . Hypertension   . Shortness of breath dyspnea    with exertion    Past Surgical History:  Procedure Laterality Date  . ABDOMINAL HYSTERECTOMY    . ANKLE FRACTURE SURGERY Right 2003  . CHOLECYSTECTOMY    . EYE SURGERY Bilateral    cataract removal  . TOTAL HIP ARTHROPLASTY Right 03/25/2015   Procedure: RIGHT TOTAL HIP ARTHROPLASTY ANTERIOR APPROACH;  Surgeon: Kathryne Hitchhristopher Y Blackman, MD;  Location: [facility] OR;  Service: Orthopedics;  Laterality: Right;     reports that she has never smoked. She has never used smokeless tobacco. She reports that she does not drink alcohol or use drugs.  No Known Allergies  Family History  Problem Relation Age of Onset  . Pulmonary embolism Neg Hx     Prior to Admission medications   Medication Sig Start Date End  Date Taking? Authorizing Provider  acetaminophen (TYLENOL) 325 MG tablet Take 650 mg by mouth every 8 (eight) hours as needed for mild pain.    [provider]  albuterol (PROVENTIL HFA;VENTOLIN HFA) 108 (90 Base) MCG/ACT inhaler Inhale 1-2 puffs into the lungs every 6 (six) hours as needed for wheezing or shortness of breath.    [provider]  aspirin EC 81 MG tablet Take 81 mg by mouth daily.    [provider]  gabapentin (NEURONTIN) 100 MG capsule Take 200 mg by mouth 3 (three) times daily.    [provider]  Multiple Vitamins-Minerals (MULTIVITAMIN WITH MINERALS) tablet Take 1 tablet by mouth daily.    [provider]  Ethelda Chickyster Shell (OYSTER CALCIUM) 500 MG TABS tablet Take 500 mg of elemental calcium by mouth 2 (two) times daily.    [provider]  telmisartan-hydrochlorothiazide (MICARDIS HCT) 80-25 MG tablet Take 1 tablet by mouth daily. 09/26/16   [provider]    Physical Exam: Vitals:   06/24/17 1630 06/24/17 1700 06/24/17 1730 06/24/17 1800  BP: (!) 202/65 (!) 188/57 (!) 192/58 (!) 198/66  Pulse: (!) 43 (!) 41 (!) 40 (!) 41  Resp: 17 (!) 21 17 17   Temp:      TempSrc:      SpO2: 96% 97% 94% 98%  Weight:      Height:  Constitutional: Anicteric no pallor. Vitals:   06/24/17 1630 06/24/17 1700 06/24/17 1730 06/24/17 1800  BP: (!) 202/65 (!) 188/57 (!) 192/58 (!) 198/66  Pulse: (!) 43 (!) 41 (!) 40 (!) 41  Resp: 17 (!) 21 17 17   Temp:      TempSrc:      SpO2: 96% 97% 94% 98%  Weight:      Height:       Eyes: No discharge from the ears eyes nose or mouth. ENMT: No mass felt.  No neck rigidity. Neck: No JVD appreciated no mass felt. Respiratory: No rhonchi or crepitations. Cardiovascular: S1-S2 heard bradycardic. Abdomen: Soft nontender bowel sounds present. Musculoskeletal: No edema.  No joint effusion. Skin: No rash.  Skin appears warm. Neurologic: Alert awake oriented to time place and  person.  Moves all extremities. Psychiatric: Appears normal.  Normal affect.   Labs on Admission: I have personally reviewed following labs and imaging studies  CBC:  Recent Labs Lab 06/24/17 1440  WBC 6.7  NEUTROABS 3.8  HGB 11.9*  HCT 37.1  MCV 83.9  PLT 238   Basic Metabolic Panel:  Recent Labs Lab 06/24/17 1440  NA 137  K 4.6  CL 103  CO2 27  GLUCOSE 119*  BUN 30*  CREATININE 0.83  CALCIUM 9.9   GFR: Estimated Creatinine Clearance: 46.7 mL/min (by C-G formula based on SCr of 0.83 mg/dL). Liver Function Tests:  Recent Labs Lab 06/24/17 1440  AST 22  ALT 12*  ALKPHOS 47  BILITOT 0.7  PROT 7.9  ALBUMIN 4.2   No results for input(s): LIPASE, AMYLASE in the last 168 hours. No results for input(s): AMMONIA in the last 168 hours. Coagulation Profile:  Recent Labs Lab 06/24/17 1440  INR 1.03   Cardiac Enzymes:  Recent Labs Lab 06/24/17 1440  TROPONINI <0.03   BNP (last 3 results) No results for input(s): PROBNP in the last 8760 hours. HbA1C: No results for input(s): HGBA1C in the last 72 hours. CBG: No results for input(s): GLUCAP in the last 168 hours. Lipid Profile: No results for input(s): CHOL, HDL, LDLCALC, TRIG, CHOLHDL, LDLDIRECT in the last 72 hours. Thyroid Function Tests: No results for input(s): TSH, T4TOTAL, FREET4, T3FREE, THYROIDAB in the last 72 hours. Anemia Panel: No results for input(s): VITAMINB12, FOLATE, FERRITIN, TIBC, IRON, RETICCTPCT in the last 72 hours. Urine analysis: No results found for: COLORURINE, APPEARANCEUR, LABSPEC, PHURINE, GLUCOSEU, HGBUR, BILIRUBINUR, KETONESUR, PROTEINUR, UROBILINOGEN, NITRITE, LEUKOCYTESUR Sepsis Labs: @LABRCNTIP (procalcitonin:4,lacticidven:4) )No results found for this or any previous visit (from the past 240 hour(s)).   Radiological Exams on Admission: Ct Angio Chest Pe W Or Wo Contrast  Addendum Date: 06/24/2017   ADDENDUM REPORT: 06/24/2017 14:13 ADDENDUM: Critical  Value/emergent results were called by telephone on 06/24/2017 at 2:12 pm to Dr. Belva Crome , who verbally acknowledged these results. Electronically Signed   By: Obie Dredge M.D.   On: 06/24/2017 14:13   Result Date: 06/24/2017 CLINICAL DATA:  Shortness of breath with exertion. Elevated D-dimer. Evaluate for pulmonary embolism. EXAM: CT ANGIOGRAPHY CHEST WITH CONTRAST TECHNIQUE: Multidetector CT imaging of the chest was performed using the standard protocol during bolus administration of intravenous contrast. Multiplanar CT image reconstructions and MIPs were obtained to evaluate the vascular anatomy. CONTRAST:  100 cc Isovue 370 intravenous contrast. COMPARISON:  MRI thoracic spine dated December 05, 2016. FINDINGS: Cardiovascular: Acute segmental and subsegmental pulmonary emboli within the right upper lobe. The lower lobe pulmonary arteries are not well evaluated due to extensive motion  artifact. Evidence of right heart strain with RV/LV ratio measuring 0.92, and reflux of contrast into the hepatic veins. Mild cardiomegaly. No pericardial effusion. Normal caliber thoracic aorta. Coronary, aortic arch, and branch vessel atherosclerotic vascular disease. Dense concentric calcification of the mitral valve. Mediastinum/Nodes: No enlarged mediastinal, hilar, or axillary lymph nodes. Thyroid gland, trachea, and esophagus demonstrate no significant findings. Lungs/Pleura: Patchy nodularity in the peripheral right upper lobe. Trace left pleural effusion. Bibasilar subsegmental atelectasis. No pneumothorax or consolidation. No suspicious pulmonary nodule. Upper Abdomen: No acute abnormality. Large hiatal hernia. Stable 1.6 x 2.6 cm cystic lesion in the pancreatic body. Musculoskeletal: No chest wall abnormality. No acute or significant osseous findings. Multiple chronic compression deformities of the thoracic spine, unchanged when compared to prior thoracic spine MRI. Review of the MIP images confirms the above  findings. IMPRESSION: 1. Acute segmental and subsegmental pulmonary emboli within the right upper lobe. The lower lobe pulmonary arteries are not well evaluated due to extensive motion artifact. There is CT evidence of right heart strain with RV/LV Ratio of 0.92 and reflux of contrast into the hepatic veins, consistent with at least submassive (intermediate risk) PE. The presence of right heart strain has been associated with an increased risk of morbidity and mortality. Please activate Code PE by paging [phone]. 2. Patchy nodularity in the peripheral right upper lobe may be infectious or inflammatory. Alternatively, this could represent early pulmonary infarct. 3.  Aortic atherosclerosis (ICD10-I70.0). Radiology assistant personnel have been notified to put me in telephone contact with the referring physician or the referring physician's clinical representative in order to discuss these findings. Once this communication is established I will issue an addendum to this report for documentation purposes. Electronically Signed: By: Obie Dredge M.D. On: 06/24/2017 13:53    EKG: Independently reviewed.  Second-degree AV block type II Mobitz.  Assessment/Plan Principal Problem:   Pulmonary embolism (HCC) Active Problems:   Essential hypertension   Heart block AV second degree    1. Pulmonary embolism unprovoked with CAT scan showing right ventricular strain -patient is placed on heparin.  Will check 2D echo Dopplers of the lower extremity and cycle cardiac markers check a BNP and lactic acid levels. 2. Second-degree AV block Mobitz type II -I have consulted cardiologist for further recommendations.  Check TSH. 3. Hypertension -we will continue ARB hold hydrochlorothiazide for now since patient is receiving IV fluids.  Addendum -cardiologist as recommended pulmonary critical care consult for possible EKOS.  I have consulted pulmonary critical care Dr. Jamison Neighbor for further recommendations.   DVT  prophylaxis: Heparin. Code Status: Full code. Family Communication: The patient's daughter. Disposition Plan: Home. Consults called: Cardiology and pulmonology. Admission status: Inpatient.   Eduard Clos MD [facility] Pager [phone].  If 7PM-7AM, please contact night-coverage [email or web address] Password [facility]  06/24/2017, 9:42 PM

## 2017-06-24 NOTE — Progress Notes (Signed)
ANTICOAGULATION CONSULT NOTE - Follow Up Consult  Pharmacy Consult for Heparin  Indication: pulmonary embolus  No Known Allergies  Patient Measurements: Height: 5\' 4"  (162.6 cm) Weight: 174 lb (78.9 kg) IBW/kg (Calculated) : 54.7 Heparin Dosing Weight: 71.4 kg  Vital Signs: Temp: 98.2 F (36.8 C) (11/02 1427) Temp Source: Oral (11/02 1427) BP: 198/66 (11/02 1800) Pulse Rate: 41 (11/02 1800)  Labs:  Recent Labs  06/24/17 1440 06/24/17 2144  HGB 11.9*  --   HCT 37.1  --   PLT 238  --   LABPROT 13.4  --   INR 1.03  --   HEPARINUNFRC  --  0.79*  CREATININE 0.83  --   TROPONINI <0.03  --     Estimated Creatinine Clearance: 46.7 mL/min (by C-G formula based on SCr of 0.83 mg/dL).   Medical History: Past Medical History:  Diagnosis Date  . Arthritis   . Burning sensation of feet    Bilateral feet  . Frequent urination   . Hypertension   . Shortness of breath dyspnea    with exertion    Assessment: 83 YO presenting with SOB on exertion. She denies any chest pain. Acute segmental and subsegmental pulmonary emboli within the right upper lobe. Currently on IV heparin at 1200 units/hr. HL is supra-therapeutic at 0.79. RN reports no s/s of bleeding   Goal of Therapy:  Heparin level 0.3-0.7 units/ml Monitor platelets by anticoagulation protocol: Yes   Plan:  -Decrease IV heparin to 1100 units/hr  -F/u 8 hr HL -Monitor daily HL, CBC and s/s of bleeding   Vinnie LevelBenjamin Joachim Carton, PharmD., BCPS Clinical Pharmacist Pager [phone]

## 2017-06-24 NOTE — ED Provider Notes (Signed)
[facility] EMERGENCY DEPARTMENT Provider Note   CSN: [id number] Arrival date & time: 06/24/17  1416     History   Chief Complaint Chief Complaint  Patient presents with  . Shortness of Breath  . Known PE    HPI Lindsey Ross is a 83 y.o. female.  HPI Patient presents with pulmonary embolisms.  Sent down after CT scan that showed PE.  Has had shortness of breath over the last couple weeks.  Seen by her primary care doctor who reportedly had reassuring labs including TSH at that time.  Sent in to see cardiology who saw the patient yesterday.  Due to positive d-dimer and shortness of breath had CT angiography done that showed PEs.  She is not on anticoagulation currently.  No unilateral swelling in her legs.  No fevers.  She does not smoke.  No recent travel.  No previous history of pulmonary embolisms.  Was bradycardic yesterday but was reportedly sinus with PACs according to the note. Past Medical History:  Diagnosis Date  . Arthritis   . Burning sensation of feet    Bilateral feet  . Frequent urination   . Hypertension   . Shortness of breath dyspnea    with exertion    Patient Active Problem List   Diagnosis Date Noted  . SOB (shortness of breath) on exertion 06/23/2017  . Bradyarrhythmia 06/23/2017  . Essential hypertension 06/23/2017  . Acute pain of right wrist 08/31/2016  . Acute bilateral low back pain without sciatica 08/31/2016  . Closed fracture distal radius and ulna, right, initial encounter 08/31/2016  . Osteoarthritis of right hip 03/25/2015  . Status post total replacement of right hip 03/25/2015    Past Surgical History:  Procedure Laterality Date  . ABDOMINAL HYSTERECTOMY    . ANKLE FRACTURE SURGERY Right 2003  . CHOLECYSTECTOMY    . EYE SURGERY Bilateral    cataract removal  . TOTAL HIP ARTHROPLASTY Right 03/25/2015   Procedure: RIGHT TOTAL HIP ARTHROPLASTY ANTERIOR APPROACH;  Surgeon: Kathryne Hitch, MD;  Location: [facility] OR;   Service: Orthopedics;  Laterality: Right;    OB History    No data available       Home Medications    Prior to Admission medications   Medication Sig Start Date End Date Taking? Authorizing Provider  acetaminophen (TYLENOL) 325 MG tablet Take 650 mg by mouth every 8 (eight) hours as needed for mild pain.    [provider]  albuterol (PROVENTIL HFA;VENTOLIN HFA) 108 (90 Base) MCG/ACT inhaler Inhale 1-2 puffs into the lungs every 6 (six) hours as needed for wheezing or shortness of breath.    [provider]  aspirin EC 81 MG tablet Take 81 mg by mouth daily.    [provider]  gabapentin (NEURONTIN) 100 MG capsule Take 200 mg by mouth 3 (three) times daily.    [provider]  Multiple Vitamins-Minerals (MULTIVITAMIN WITH MINERALS) tablet Take 1 tablet by mouth daily.    [provider]  Ethelda Chick (OYSTER CALCIUM) 500 MG TABS tablet Take 500 mg of elemental calcium by mouth 2 (two) times daily.    [provider]  telmisartan-hydrochlorothiazide (MICARDIS HCT) 80-25 MG tablet Take 1 tablet by mouth daily. 09/26/16   [provider]    Family History No family history on file.  Social History Social History  Substance Use Topics  . Smoking status: Never Smoker  . Smokeless tobacco: Never Used  . Alcohol use No  Allergies   Patient has no known allergies.   Review of Systems Review of Systems  Constitutional: Negative for activity change.  HENT: Positive for congestion.   Respiratory: Positive for shortness of breath.   Cardiovascular: Negative for chest pain and leg swelling.  Gastrointestinal: Negative for abdominal pain.  Genitourinary: Negative for flank pain.  Musculoskeletal: Negative for back pain.  Neurological: Negative for numbness.  Hematological: Negative for adenopathy.  Psychiatric/Behavioral: Negative for confusion.     Physical Exam Updated Vital Signs BP (!) 216/67   Pulse (!)  42   Temp 98.2 F (36.8 C) (Oral)   Resp 19   Ht 5\' 4"  (1.626 m)   Wt 78.9 kg (174 lb)   SpO2 96%   BMI 29.87 kg/m   Physical Exam  Constitutional: She appears well-developed.  HENT:  Head: Atraumatic.  Eyes: Pupils are equal, round, and reactive to light.  Cardiovascular:  Bradycardia.  Regular  Pulmonary/Chest: Effort normal. She has no wheezes. She has no rales.  Abdominal: Soft. There is no tenderness.  Musculoskeletal: She exhibits edema.  Mild bilateral lower extremity pitting edema  Skin: Skin is warm. Capillary refill takes less than 2 seconds.     ED Treatments / Results  Labs (all labs ordered are listed, but only abnormal results are displayed) Labs Reviewed  COMPREHENSIVE METABOLIC PANEL - Abnormal; Notable for the following:       Result Value   Glucose, Bld 119 (*)    BUN 30 (*)    ALT 12 (*)    All other components within normal limits  CBC WITH DIFFERENTIAL/PLATELET - Abnormal; Notable for the following:    Hemoglobin 11.9 (*)    All other components within normal limits  PROTIME-INR  TROPONIN I    EKG  EKG Interpretation  Date/Time:  Friday June 24 2017 14:51:52 EDT Ventricular Rate:  43 PR Interval:    QRS Duration: 127 QT Interval:  484 QTC Calculation: 410 R Axis:   -39 Text Interpretation:  Second degree AV block, Mobitz II Multiple premature complexes, vent & supraven IVCD, consider atypical RBBB Confirmed by Benjiman CorePickering, Lyndel Dancel [phone]) on 06/24/2017 3:33:00 PM       Radiology Ct Angio Chest Pe W Or Wo Contrast  Addendum Date: 06/24/2017   ADDENDUM REPORT: 06/24/2017 14:13 ADDENDUM: Critical Value/emergent results were called by telephone on 06/24/2017 at 2:12 pm to Dr. Belva CromeAJAN REVANKAR , who verbally acknowledged these results. Electronically Signed   By: Obie DredgeWilliam T Derry M.D.   On: 06/24/2017 14:13   Result Date: 06/24/2017 CLINICAL DATA:  Shortness of breath with exertion. Elevated D-dimer. Evaluate for pulmonary embolism. EXAM: CT  ANGIOGRAPHY CHEST WITH CONTRAST TECHNIQUE: Multidetector CT imaging of the chest was performed using the standard protocol during bolus administration of intravenous contrast. Multiplanar CT image reconstructions and MIPs were obtained to evaluate the vascular anatomy. CONTRAST:  100 cc Isovue 370 intravenous contrast. COMPARISON:  MRI thoracic spine dated December 05, 2016. FINDINGS: Cardiovascular: Acute segmental and subsegmental pulmonary emboli within the right upper lobe. The lower lobe pulmonary arteries are not well evaluated due to extensive motion artifact. Evidence of right heart strain with RV/LV ratio measuring 0.92, and reflux of contrast into the hepatic veins. Mild cardiomegaly. No pericardial effusion. Normal caliber thoracic aorta. Coronary, aortic arch, and branch vessel atherosclerotic vascular disease. Dense concentric calcification of the mitral valve. Mediastinum/Nodes: No enlarged mediastinal, hilar, or axillary lymph nodes. Thyroid gland, trachea, and esophagus demonstrate no significant findings. Lungs/Pleura: Patchy nodularity in the  peripheral right upper lobe. Trace left pleural effusion. Bibasilar subsegmental atelectasis. No pneumothorax or consolidation. No suspicious pulmonary nodule. Upper Abdomen: No acute abnormality. Large hiatal hernia. Stable 1.6 x 2.6 cm cystic lesion in the pancreatic body. Musculoskeletal: No chest wall abnormality. No acute or significant osseous findings. Multiple chronic compression deformities of the thoracic spine, unchanged when compared to prior thoracic spine MRI. Review of the MIP images confirms the above findings. IMPRESSION: 1. Acute segmental and subsegmental pulmonary emboli within the right upper lobe. The lower lobe pulmonary arteries are not well evaluated due to extensive motion artifact. There is CT evidence of right heart strain with RV/LV Ratio of 0.92 and reflux of contrast into the hepatic veins, consistent with at least submassive  (intermediate risk) PE. The presence of right heart strain has been associated with an increased risk of morbidity and mortality. Please activate Code PE by paging [phone]. 2. Patchy nodularity in the peripheral right upper lobe may be infectious or inflammatory. Alternatively, this could represent early pulmonary infarct. 3.  Aortic atherosclerosis (ICD10-I70.0). Radiology assistant personnel have been notified to put me in telephone contact with the referring physician or the referring physician's clinical representative in order to discuss these findings. Once this communication is established I will issue an addendum to this report for documentation purposes. Electronically Signed: By: Obie Dredge M.D. On: 06/24/2017 13:53    Procedures Procedures (including critical care time)  Medications Ordered in ED Medications  heparin ADULT infusion 100 units/mL (25000 units/252mL sodium chloride 0.45%) (1,200 Units/hr Intravenous New Bag/Given 06/24/17 1517)  heparin bolus via infusion 4,000 Units (4,000 Units Intravenous Bolus from Bag 06/24/17 1521)     Initial Impression / Assessment and Plan / ED Course  I have reviewed the triage vital signs and the nursing notes.  Pertinent labs & imaging results that were available during my care of the patient were reviewed by me and considered in my medical decision making (see chart for details).     Patient with shortness of breath.  New onset pulmonary embolism with some potential right heart strain on CT.  Also bradycardia.  I went up and discussed with Dr. Tomie China about the EKG findings today versus yesterday.  Today's appear to be in a second-degree AV block.  A sinus bradycardia with U waves is felt less likely.  The EKG yesterday reportedly was not clearly a Mobitz block but it was considered.  More likely sinus yesterday.  She is not on AV nodal blocking medications.  Will admit to hospitalist and will likely need cardiology  consult.  CRITICAL CARE Performed by: Billee Cashing Total critical care time: 30 minutes Critical care time was exclusive of separately billable procedures and treating other patients. Critical care was necessary to treat or prevent imminent or life-threatening deterioration. Critical care was time spent personally by me on the following activities: development of treatment plan with patient and/or surrogate as well as nursing, discussions with consultants, evaluation of patient's response to treatment, examination of patient, obtaining history from patient or surrogate, ordering and performing treatments and interventions, ordering and review of laboratory studies, ordering and review of radiographic studies, pulse oximetry and re-evaluation of patient's condition.  Final Clinical Impressions(s) / ED Diagnoses   Final diagnoses:  Other acute pulmonary embolism without acute cor pulmonale (HCC)  Heart block AV second degree    New Prescriptions New Prescriptions   No medications on file     Benjiman Core, MD 06/24/17 1535

## 2017-06-24 NOTE — Progress Notes (Signed)
Called by Dr. Rubin PayorPickering regarding Mrs. Lindsey Ross, 83 yo F, with new diagnosis of PE and 2:1 block. Stable, anticoagulated. She was seen by cards yesterday for SOB on exertion. Accepted to SDU  RN, on patient arrival, please call [phone] and let patient placement RN know of the patient's arrival and a hospitalist will be assigned to admit the patient.   Jehiel Koepp M. Elvera LennoxGherghe, MD [facility] [phone]  If 7PM-7AM, please contact night-coverage [email or web address] Password [email or web address]  10/01/2016, 5:37 AM

## 2017-06-24 NOTE — Consult Note (Signed)
Reason for Consult:BRADYCARDIA Referring Physician: MICHAYLA Ross is an 83 y.o. female.  Consulted  By [facility]  For  Bradycardia.  Pt admitted  With PE, dyspnea,   Cardiologist - DR Geraldo Pitter,  Lindsey Ross is a 83 y.o. female with history of hypertension has been experiencing exertional shortness of breath over the last 2 weeks.  Patient also has been having some upper back pain.  Denies any fever chills productive cough.  Patient had gone to her cardiologist yesterday for further assessment.  D-dimer was done which was positive and patient had CT angiogram of the chest done which was positive for pulmonary embolism with right ventricular strain.  Patient was referred to the ER.  ED Course: In the ER patient's blood pressure was elevated.  Mildly tachypneic.  Patient was started on heparin infusion.  Patient's heart rate was found to be around 40s with EKG showing second-degree AV block type II Mobitz.  At the time of my examination patient is not in distress.  Review of Systems: As per HPI, rest all negative.       Past Medical History:  Diagnosis Date  . Arthritis   . Burning sensation of feet    Bilateral feet  . Frequent urination   . Hypertension   . Shortness of breath dyspnea    with exertion         Past Surgical History:  Procedure Laterality Date  . ABDOMINAL HYSTERECTOMY    . ANKLE FRACTURE SURGERY Right 2003  . CHOLECYSTECTOMY    . EYE SURGERY Bilateral    cataract removal  . TOTAL HIP ARTHROPLASTY Right 03/25/2015   Procedure: RIGHT TOTAL HIP ARTHROPLASTY ANTERIOR APPROACH;  Surgeon: Mcarthur Rossetti, MD;  Location: [place];  Service: Orthopedics;  Laterality: Right;     reports that she has never smoked. She has never used smokeless tobacco. She reports that she does not drink alcohol or use drugs.  No Known Allergies       Family History  Problem Relation Age of Onset  . Pulmonary embolism Neg Hx             Prior to Admission medications   Medication Sig Start Date End Date Taking? Authorizing Provider  acetaminophen (TYLENOL) 325 MG tablet Take 650 mg by mouth every 8 (eight) hours as needed for mild pain.    [provider]  albuterol (PROVENTIL HFA;VENTOLIN HFA) 108 (90 Base) MCG/ACT inhaler Inhale 1-2 puffs into the lungs every 6 (six) hours as needed for wheezing or shortness of breath.    [provider]  aspirin EC 81 MG tablet Take 81 mg by mouth daily.    [provider]  gabapentin (NEURONTIN) 100 MG capsule Take 200 mg by mouth 3 (three) times daily.    [provider]  Multiple Vitamins-Minerals (MULTIVITAMIN WITH MINERALS) tablet Take 1 tablet by mouth daily.    [provider]  Loma [place] (OYSTER CALCIUM) 500 MG TABS tablet Take 500 mg of elemental calcium by mouth 2 (two) times daily.    [provider]  telmisartan-hydrochlorothiazide (MICARDIS HCT) 80-25 MG tablet Take 1 tablet by mouth daily. 09/26/16   [provider]    Physical Exam:       Vitals:   06/24/17 1630 06/24/17 1700 06/24/17 1730 06/24/17 1800  BP: (!) 202/65 (!) 188/57 (!) 192/58 (!) 198/66  Pulse: (!) 43 (!) 41 (!) 40 (!) 41  Resp: 17 (!) 21 17 17  Temp:      TempSrc:      SpO2: 96% 97% 94% 98%  Weight:      Height:          Constitutional: Anicteric no pallor.       Vitals:   06/24/17 1630 06/24/17 1700 06/24/17 1730 06/24/17 1800  BP: (!) 202/65 (!) 188/57 (!) 192/58 (!) 198/66  Pulse: (!) 43 (!) 41 (!) 40 (!) 41  Resp: 17 (!) '21 17 17  ' Temp:      TempSrc:      SpO2: 96% 97% 94% 98%  Weight:      Height:       Eyes: No discharge from the ears eyes nose or mouth. ENMT: No mass felt.  No neck rigidity. Neck: No JVD appreciated no mass felt. Respiratory: No rhonchi or crepitations. Cardiovascular: S1-S2 heard bradycardic. Abdomen: Soft nontender bowel  sounds present. Musculoskeletal: No edema.  No joint effusion. Skin: No rash.  Skin appears warm. Neurologic: Alert awake oriented to time place and person.  Moves all extremities. Psychiatric: Appears normal.  Normal affect.   HPI:  IN SETTING OF  PE, Sub  Massive,  RIGHT  HEART  STRAIN  SUGGEST  ICU CONSULT, EKOS D/W HOSPITALIST  Echo, Ep CONSULT  AS WELL, tYPE 2  BLOCK  No dissoness,  On hep gtt  Consult  In progress  Past Medical History:  Diagnosis Date  . Arthritis   . Burning sensation of feet    Bilateral feet  . Frequent urination   . Hypertension   . Shortness of breath dyspnea    with exertion    Past Surgical History:  Procedure Laterality Date  . ABDOMINAL HYSTERECTOMY    . ANKLE FRACTURE SURGERY Right 2003  . CHOLECYSTECTOMY    . EYE SURGERY Bilateral    cataract removal  . TOTAL HIP ARTHROPLASTY Right 03/25/2015   Procedure: RIGHT TOTAL HIP ARTHROPLASTY ANTERIOR APPROACH;  Surgeon: Mcarthur Rossetti, MD;  Location: [place];  Service: Orthopedics;  Laterality: Right;    Family History  Problem Relation Age of Onset  . Pulmonary embolism Neg Hx     Social History:  reports that she has never smoked. She has never used smokeless tobacco. She reports that she does not drink alcohol or use drugs.  Allergies: No Known Allergies  Medications: I have reviewed the patient's current medications.  Results for orders placed or performed during the hospital encounter of 06/24/17 (from the past 48 hour(s))  Comprehensive metabolic panel     Status: Abnormal   Collection Time: 06/24/17  2:40 PM  Result Value Ref Range   Sodium 137 135 - 145 mmol/L   Potassium 4.6 3.5 - 5.1 mmol/L   Chloride 103 101 - 111 mmol/L   CO2 27 22 - 32 mmol/L   Glucose, Bld 119 (H) 65 - 99 mg/dL   BUN 30 (H) 6 - 20 mg/dL   Creatinine, Ser 0.83 0.44 - 1.00 mg/dL   Calcium 9.9 8.9 - 10.3 mg/dL   Total Protein 7.9 6.5 - 8.1 g/dL   Albumin 4.2 3.5 - 5.0 g/dL   AST 22 15 - 41  U/L   ALT 12 (L) 14 - 54 U/L   Alkaline Phosphatase 47 38 - 126 U/L   Total Bilirubin 0.7 0.3 - 1.2 mg/dL   GFR calc non Af Amer >60 >60 mL/min   GFR calc Af Amer >60 >60 mL/min    Comment: (NOTE) The eGFR has been  calculated using the CKD EPI equation. This calculation has not been validated in all clinical situations. eGFR's persistently <60 mL/min signify possible Chronic Kidney Disease.    Anion gap 7 5 - 15  Protime-INR     Status: None   Collection Time: 06/24/17  2:40 PM  Result Value Ref Range   Prothrombin Time 13.4 11.4 - 15.2 seconds   INR 1.03   CBC with Differential     Status: Abnormal   Collection Time: 06/24/17  2:40 PM  Result Value Ref Range   WBC 6.7 4.0 - 10.5 K/uL   RBC 4.42 3.87 - 5.11 MIL/uL   Hemoglobin 11.9 (L) 12.0 - 15.0 g/dL   HCT 37.1 36.0 - 46.0 %   MCV 83.9 78.0 - 100.0 fL   MCH 26.9 26.0 - 34.0 pg   MCHC 32.1 30.0 - 36.0 g/dL   RDW 15.0 11.5 - 15.5 %   Platelets 238 150 - 400 K/uL   Neutrophils Relative % 57 %   Neutro Abs 3.8 1.7 - 7.7 K/uL   Lymphocytes Relative 30 %   Lymphs Abs 2.0 0.7 - 4.0 K/uL   Monocytes Relative 10 %   Monocytes Absolute 0.7 0.1 - 1.0 K/uL   Eosinophils Relative 3 %   Eosinophils Absolute 0.2 0.0 - 0.7 K/uL   Basophils Relative 0 %   Basophils Absolute 0.0 0.0 - 0.1 K/uL  Troponin I     Status: None   Collection Time: 06/24/17  2:40 PM  Result Value Ref Range   Troponin I <0.03 <0.03 ng/mL  Heparin level (unfractionated)     Status: Abnormal   Collection Time: 06/24/17  9:44 PM  Result Value Ref Range   Heparin Unfractionated 0.79 (H) 0.30 - 0.70 IU/mL    Comment:        IF HEPARIN RESULTS ARE BELOW EXPECTED VALUES, AND PATIENT DOSAGE HAS BEEN CONFIRMED, SUGGEST FOLLOW UP TESTING OF ANTITHROMBIN III LEVELS.   Troponin I (q 6hr x 3)     Status: None   Collection Time: 06/24/17  9:44 PM  Result Value Ref Range   Troponin I <0.03 <0.03 ng/mL  Brain natriuretic peptide     Status: Abnormal   Collection  Time: 06/24/17  9:46 PM  Result Value Ref Range   B Natriuretic Peptide 342.1 (H) 0.0 - 100.0 pg/mL    Ct Angio Chest Pe W Or Wo Contrast  Addendum Date: 06/24/2017   ADDENDUM REPORT: 06/24/2017 14:13 ADDENDUM: Critical Value/emergent results were called by telephone on 06/24/2017 at 2:12 pm to Dr. Jyl Heinz , who verbally acknowledged these results. Electronically Signed   By: Titus Dubin M.D.   On: 06/24/2017 14:13   Result Date: 06/24/2017 CLINICAL DATA:  Shortness of breath with exertion. Elevated D-dimer. Evaluate for pulmonary embolism. EXAM: CT ANGIOGRAPHY CHEST WITH CONTRAST TECHNIQUE: Multidetector CT imaging of the chest was performed using the standard protocol during bolus administration of intravenous contrast. Multiplanar CT image reconstructions and MIPs were obtained to evaluate the vascular anatomy. CONTRAST:  100 cc Isovue 370 intravenous contrast. COMPARISON:  MRI thoracic spine dated December 05, 2016. FINDINGS: Cardiovascular: Acute segmental and subsegmental pulmonary emboli within the right upper lobe. The lower lobe pulmonary arteries are not well evaluated due to extensive motion artifact. Evidence of right heart strain with RV/LV ratio measuring 0.92, and reflux of contrast into the hepatic veins. Mild cardiomegaly. No pericardial effusion. Normal caliber thoracic aorta. Coronary, aortic arch, and branch vessel atherosclerotic vascular disease. Dense  concentric calcification of the mitral valve. Mediastinum/Nodes: No enlarged mediastinal, hilar, or axillary lymph nodes. Thyroid gland, trachea, and esophagus demonstrate no significant findings. Lungs/Pleura: Patchy nodularity in the peripheral right upper lobe. Trace left pleural effusion. Bibasilar subsegmental atelectasis. No pneumothorax or consolidation. No suspicious pulmonary nodule. Upper Abdomen: No acute abnormality. Large hiatal hernia. Stable 1.6 x 2.6 cm cystic lesion in the pancreatic body. Musculoskeletal: No  chest wall abnormality. No acute or significant osseous findings. Multiple chronic compression deformities of the thoracic spine, unchanged when compared to prior thoracic spine MRI. Review of the MIP images confirms the above findings. IMPRESSION: 1. Acute segmental and subsegmental pulmonary emboli within the right upper lobe. The lower lobe pulmonary arteries are not well evaluated due to extensive motion artifact. There is CT evidence of right heart strain with RV/LV Ratio of 0.92 and reflux of contrast into the hepatic veins, consistent with at least submassive (intermediate risk) PE. The presence of right heart strain has been associated with an increased risk of morbidity and mortality. Please activate Code PE by paging [phone]. 2. Patchy nodularity in the peripheral right upper lobe may be infectious or inflammatory. Alternatively, this could represent early pulmonary infarct. 3.  Aortic atherosclerosis (ICD10-I70.0). Radiology assistant personnel have been notified to put me in telephone contact with the referring physician or the referring physician's clinical representative in order to discuss these findings. Once this communication is established I will issue an addendum to this report for documentation purposes. Electronically Signed: By: Titus Dubin M.D. On: 06/24/2017 13:53    ROS Blood pressure (!) 198/66, pulse (!) 41, temperature 98.2 F (36.8 C), temperature source Oral, resp. rate 17, height '5\' 4"'  (1.626 m), weight 78.9 kg (174 lb), SpO2 98 %. Physical Exam  Assessment/Plan:  Bradycardia PULMONARY  EMBOLISM  HTN  I discussed  With  Pt  And  Her  Daughter  And  Reviewed  EKG  And  CT  Scan  Findings, Type  II   Mobitz block , ASYMTOMATIC,   No syncope  Pt  On hep gtt Will also  Recommend  EP eval   Requested  EKOS   eval  By  Critical  Care.  D/W  hospitalist   About  Care plan ,  Await  Echo  And  Will further  Risk stratify  And  Have EP opine  As  Well, for  Any  plausible  Sinus  Node  Dyfunction.  Bradyclcardia  Here  likely  Due to underlying  PE  And  Strain ,  But  Cannot  Rule  Out  Sinus  Node  Dyfunction.  Pt and  Family  Aware  Of  Plan    Lindsey Ross 06/24/2017, 11:49 PM

## 2017-06-24 NOTE — Progress Notes (Addendum)
ANTICOAGULATION CONSULT NOTE - Initial Consult  Pharmacy Consult for Heparin  Indication: pulmonary embolus  No Known Allergies  Patient Measurements: Height: 5\' 4"  (162.6 cm) Weight: 174 lb (78.9 kg) IBW/kg (Calculated) : 54.7 Heparin Dosing Weight: 71.4 kg  Vital Signs: Temp: 98.2 F (36.8 C) (11/02 1427) Temp Source: Oral (11/02 1427) BP: 216/67 (11/02 1455) Pulse Rate: 42 (11/02 1455)  Labs:  Recent Labs  06/24/17 1440  HGB 11.9*  HCT 37.1  PLT 238  LABPROT 13.4  INR 1.03  CREATININE 0.83    Estimated Creatinine Clearance: 46.7 mL/min (by C-G formula based on SCr of 0.83 mg/dL).   Medical History: Past Medical History:  Diagnosis Date  . Arthritis   . Burning sensation of feet    Bilateral feet  . Frequent urination   . Hypertension   . Shortness of breath dyspnea    with exertion    Medications:  Heparin 4000 units X1 Heparin 1,200 units/hr    Assessment: 83 YO presenting with SOB on exertion. She denies any chest pain. Acute segmental and subsegmental pulmonary emboli within the right upper lobe. Hgb slightly low 11.9 and platelets WNL. No blood thinners prior to admission.   Goal of Therapy:  Heparin level 0.3-0.7 units/ml Monitor platelets by anticoagulation protocol: Yes   Plan:  Give 4,000 units bolus x 1 Start heparin infusion at 1,200 units/hr   Check 8 hr heparin level and daily heparin level and CBC   Avani Sensabaugh 06/24/2017,3:21 PM

## 2017-06-24 NOTE — ED Triage Notes (Signed)
She was seen by her MD yesterday for SOB. She had a CT today that showed a PE. States she feels slightly SOB right now.

## 2017-06-25 ENCOUNTER — Encounter (HOSPITAL_COMMUNITY): Payer: Self-pay | Admitting: Family Medicine

## 2017-06-25 ENCOUNTER — Inpatient Hospital Stay (HOSPITAL_COMMUNITY): Payer: Medicare Other

## 2017-06-25 DIAGNOSIS — I10 Essential (primary) hypertension: Secondary | ICD-10-CM

## 2017-06-25 DIAGNOSIS — I441 Atrioventricular block, second degree: Secondary | ICD-10-CM

## 2017-06-25 DIAGNOSIS — I2699 Other pulmonary embolism without acute cor pulmonale: Principal | ICD-10-CM

## 2017-06-25 DIAGNOSIS — I700 Atherosclerosis of aorta: Secondary | ICD-10-CM

## 2017-06-25 HISTORY — DX: Atherosclerosis of aorta: I70.0

## 2017-06-25 LAB — BASIC METABOLIC PANEL
ANION GAP: 9 (ref 5–15)
BUN: 20 mg/dL (ref 6–20)
CHLORIDE: 106 mmol/L (ref 101–111)
CO2: 25 mmol/L (ref 22–32)
Calcium: 9.1 mg/dL (ref 8.9–10.3)
Creatinine, Ser: 0.74 mg/dL (ref 0.44–1.00)
GFR calc non Af Amer: >60 mL/min (ref >60)
Glucose, Bld: 104 mg/dL — ABNORMAL HIGH (ref 65–99)
POTASSIUM: 3.7 mmol/L (ref 3.5–5.1)
SODIUM: 140 mmol/L (ref 135–145)

## 2017-06-25 LAB — CBC
HEMATOCRIT: 35.7 % — AB (ref 36.0–46.0)
Hemoglobin: 11 g/dL — ABNORMAL LOW (ref 12.0–15.0)
MCH: 26.1 pg (ref 26.0–34.0)
MCHC: 30.8 g/dL (ref 30.0–36.0)
MCV: 84.6 fL (ref 78.0–100.0)
Platelets: 222 10*3/uL (ref 150–400)
RBC: 4.22 MIL/uL (ref 3.87–5.11)
RDW: 15.2 % (ref 11.5–15.5)
WBC: 6.3 10*3/uL (ref 4.0–10.5)

## 2017-06-25 LAB — TROPONIN I
Troponin I: <0.03 ng/mL (ref <0.03)
Troponin I: <0.03 ng/mL (ref <0.03)

## 2017-06-25 LAB — HEPARIN LEVEL (UNFRACTIONATED): Heparin Unfractionated: 0.63 IU/mL (ref 0.30–0.70)

## 2017-06-25 LAB — LACTIC ACID, PLASMA: LACTIC ACID, VENOUS: 1.5 mmol/L (ref 0.5–1.9)

## 2017-06-25 MED ORDER — HEPARIN (PORCINE) IN NACL 100-0.45 UNIT/ML-% IJ SOLN
1150.0000 [IU]/h | INTRAMUSCULAR | Status: AC
  Administered 2017-06-25 – 2017-06-27 (×3): 1050 [IU]/h via INTRAVENOUS
  Filled 2017-06-25 (×2): qty 250

## 2017-06-25 MED ORDER — HYDROCHLOROTHIAZIDE 25 MG PO TABS
25.0000 mg | ORAL_TABLET | Freq: Every day | ORAL | Status: DC
  Administered 2017-06-25 – 2017-06-28 (×4): 25 mg via ORAL
  Filled 2017-06-25 (×4): qty 1

## 2017-06-25 MED ORDER — GABAPENTIN 100 MG PO CAPS
200.0000 mg | ORAL_CAPSULE | Freq: Three times a day (TID) | ORAL | Status: DC
  Administered 2017-06-25 – 2017-06-28 (×10): 200 mg via ORAL
  Filled 2017-06-25 (×10): qty 2

## 2017-06-25 NOTE — Progress Notes (Signed)
Preliminary results by tech: Bilat lower venous duplex completed. Left leg, positive for acute DVT involving the posterior tibial veins, right leg neg. For DVT.

## 2017-06-25 NOTE — Progress Notes (Signed)
ANTICOAGULATION CONSULT NOTE - Follow Up Consult  Pharmacy Consult:  Heparin  Indication: pulmonary embolus  No Known Allergies  Patient Measurements: Height: 5\' 4"  (162.6 cm) Weight: 174 lb (78.9 kg) IBW/kg (Calculated) : 54.7 Heparin Dosing Weight: 71 kg  Vital Signs: Temp: 97.8 F (36.6 C) (11/03 1301) Temp Source: Oral (11/03 1301) BP: 143/124 (11/03 1301) Pulse Rate: 44 (11/03 1301)  Labs:  Recent Labs  06/24/17 1440 06/24/17 2144 06/25/17 0319 06/25/17 1120  HGB 11.9*  --  11.0*  --   HCT 37.1  --  35.7*  --   PLT 238  --  222  --   LABPROT 13.4  --   --   --   INR 1.03  --   --   --   HEPARINUNFRC  --  0.79*  --  0.63  CREATININE 0.83  --  0.74  --   TROPONINI <0.03 <0.03 <0.03 <0.03    Estimated Creatinine Clearance: 48.5 mL/min (by C-G formula based on SCr of 0.74 mg/dL).   Assessment: [age] YOF presented with SOB on exertion and found to have acute segmental and subsegmental pulmonary emboli.  Currently on IV heparin and heparin level is therapeutic post rate decrease yesterday.  No bleeding reported.   Goal of Therapy:  Heparin level 0.3-0.7 units/ml Monitor platelets by anticoagulation protocol: Yes    Plan:  Reduce heparin gtt slgihtly to 1050 units/hr Daily heparin level and CBC F/U with long-term AC plan   Dantre Yearwood D. Laney Potashang, PharmD, BCPS Pager:  [phone] 06/25/2017, 1:38 PM

## 2017-06-25 NOTE — Progress Notes (Signed)
  PROGRESS NOTE  Lindsey JonesRose B Ross ZOX:[id number] DOB: 06/14/1925 DOA: 06/24/2017 PCP: Chilton GreathouseAvva, Ravisankar, MD  Brief Narrative: 89yow seen by cardiology outpatient 11/1 for SOB. D-dimer was positive and pt sent for CTA which showed PE with potential RV strain >> sent to ED. Found to have bradycardia with Mobitz II  Assessment/Plan Acute segmental and subsegmental pulmonary emboli within the right upper lobe with CT evidence of RV strain and CT findings to suggest intermediate risk PE. -heparin infusion. PCCM rec no EKOS. Echo pending. -f/u BLE venous dopplers   Mobitz I/II. TSH WNL -cardiology following, recommended echo and EP eval 11/4 -TSH WNL  Essential HTN -stable. Resume HCTZ. Continue ARB.  Aortic atherosclerosis -asymptomatic. Consider statin as outpatient if benefit > risk.   Appears stable, discussed oral anticoagulants. Will change to oral after EP eval/procedure.  DVT prophylaxis: heparin infusion Code Status: full Family Communication: daughter at bedside Disposition Plan: return home   Brendia Sacksaniel Goodrich, MD  [facility] Direct contact: [phone] --Via amion app OR  --[email or web address]; password [email or web address]  7PM-7AM contact night coverage as above 06/25/2017, 11:20 AM  LOS: 1 day   Consultants:  Cardiology  PCCM  Procedures:    Antimicrobials:    Interval history/Subjective: No issues charted since admission  Feels ok. No CP or SOB. No dizziness.  Objective: Vitals:  Vitals:   06/24/17 1800 06/25/17 0921  BP: (!) 198/66 (!) 147/65  Pulse: (!) 41 (!) 55  Resp: 17 15  Temp:    SpO2: 98% 97%     Exam:    Constitutional:  . Appears calm and comfortable Eyes:  . pupils and irises appear normal ENMT:  . grossly normal hearing  . Lips appear normal Respiratory:  . CTA bilaterally, no w/r/r.  . Respiratory effort normal.  Cardiovascular:  . Bradycardic, regular rhythm, no m/r/g . No LE extremity edema   Musculoskeletal:   . Digits/nails BUE: no clubbing, cyanosis, petechiae, infection . RLE, LLE   o strength and tone normal, no atrophy, no abnormal movements Skin:  . No rashes, lesions, ulcers . palpation of skin: no induration or nodules Psychiatric:  . judgement and insight appear normal . Mental status o Mood, affect appropriate   I have personally reviewed the following:   Labs:  BMP, troponin, lactic acid, CBC unremarkable  Imaging studies:  CT chest noted  Scheduled Meds: . gabapentin  200 mg Oral TID  . hydrochlorothiazide  25 mg Oral Daily  . irbesartan  300 mg Oral Daily   Continuous Infusions: . heparin 1,100 Units/hr (06/25/17 0827)    Principal Problem:   Pulmonary embolism (HCC) Active Problems:   Essential hypertension   Heart block AV second degree   LOS: 1 day

## 2017-06-25 NOTE — Progress Notes (Signed)
Heart rate continues to dip to mid 30's with second degree type II heart block. On call physician notified.

## 2017-06-25 NOTE — Progress Notes (Signed)
Progress Note  Patient Name: Lindsey Ross Date of Encounter: 06/25/2017  Primary Cardiologist: Dr. Normajean Baxter  Subjective   No complaints today remains in second-degree heart block  Inpatient Medications    Scheduled Meds: . aspirin EC  81 mg Oral Daily  . irbesartan  300 mg Oral Daily   Continuous Infusions: . sodium chloride 75 mL/hr at 06/25/17 0827  . heparin 1,100 Units/hr (06/25/17 0827)   PRN Meds: acetaminophen **OR** acetaminophen, albuterol, hydrALAZINE   Vital Signs    Vitals:   06/24/17 1700 06/24/17 1730 06/24/17 1800 06/25/17 0921  BP: (!) 188/57 (!) 192/58 (!) 198/66 (!) 147/65  Pulse: (!) 41 (!) 40 (!) 41 (!) 55  Resp: (!) 21 17 17 15   Temp:      TempSrc:      SpO2: 97% 94% 98% 97%  Weight:      Height:       No intake or output data in the 24 hours ending 06/25/17 1050 Filed Weights   06/24/17 1425  Weight: 174 lb (78.9 kg)    Telemetry    NSR with intermittent second degree AV block Mobitz I and II with HR 38bpm and occasional PACs - Personally Reviewed  ECG    No new EKG to review - Personally Reviewed  Physical Exam   GEN: No acute distress.   Neck: No JVD Cardiac: RRR, no murmurs, rubs, or gallops.  Respiratory: Clear to auscultation bilaterally. GI: Soft, nontender, non-distended  MS: No edema; No deformity. Neuro:  Nonfocal  Psych: Normal affect   Labs    Chemistry Recent Labs Lab 06/24/17 1440 06/25/17 0319  NA 137 140  K 4.6 3.7  CL 103 106  CO2 27 25  GLUCOSE 119* 104*  BUN 30* 20  CREATININE 0.83 0.74  CALCIUM 9.9 9.1  PROT 7.9  --   ALBUMIN 4.2  --   AST 22  --   ALT 12*  --   ALKPHOS 47  --   BILITOT 0.7  --   GFRNONAA >60 >60  GFRAA >60 >60  ANIONGAP 7 9     Hematology Recent Labs Lab 06/24/17 1440 06/25/17 0319  WBC 6.7 6.3  RBC 4.42 4.22  HGB 11.9* 11.0*  HCT 37.1 35.7*  MCV 83.9 84.6  MCH 26.9 26.1  MCHC 32.1 30.8  RDW 15.0 15.2  PLT 238 222    Cardiac Enzymes Recent  Labs Lab 06/24/17 1440 06/24/17 2144 06/25/17 0319  TROPONINI <0.03 <0.03 <0.03   No results for input(s): TROPIPOC in the last 168 hours.   BNP Recent Labs Lab 06/24/17 2146  BNP 342.1*     DDimer  Recent Labs Lab 06/23/17 1113  DDIMER 3.50*     Radiology    Ct Angio Chest Pe W Or Wo Contrast  Addendum Date: 06/24/2017   ADDENDUM REPORT: 06/24/2017 14:13 ADDENDUM: Critical Value/emergent results were called by telephone on 06/24/2017 at 2:12 pm to Dr. Belva Crome , who verbally acknowledged these results. Electronically Signed   By: Obie Dredge M.D.   On: 06/24/2017 14:13   Result Date: 06/24/2017 CLINICAL DATA:  Shortness of breath with exertion. Elevated D-dimer. Evaluate for pulmonary embolism. EXAM: CT ANGIOGRAPHY CHEST WITH CONTRAST TECHNIQUE: Multidetector CT imaging of the chest was performed using the standard protocol during bolus administration of intravenous contrast. Multiplanar CT image reconstructions and MIPs were obtained to evaluate the vascular anatomy. CONTRAST:  100 cc Isovue 370 intravenous contrast. COMPARISON:  MRI thoracic spine dated  December 05, 2016. FINDINGS: Cardiovascular: Acute segmental and subsegmental pulmonary emboli within the right upper lobe. The lower lobe pulmonary arteries are not well evaluated due to extensive motion artifact. Evidence of right heart strain with RV/LV ratio measuring 0.92, and reflux of contrast into the hepatic veins. Mild cardiomegaly. No pericardial effusion. Normal caliber thoracic aorta. Coronary, aortic arch, and branch vessel atherosclerotic vascular disease. Dense concentric calcification of the mitral valve. Mediastinum/Nodes: No enlarged mediastinal, hilar, or axillary lymph nodes. Thyroid gland, trachea, and esophagus demonstrate no significant findings. Lungs/Pleura: Patchy nodularity in the peripheral right upper lobe. Trace left pleural effusion. Bibasilar subsegmental atelectasis. No pneumothorax or  consolidation. No suspicious pulmonary nodule. Upper Abdomen: No acute abnormality. Large hiatal hernia. Stable 1.6 x 2.6 cm cystic lesion in the pancreatic body. Musculoskeletal: No chest wall abnormality. No acute or significant osseous findings. Multiple chronic compression deformities of the thoracic spine, unchanged when compared to prior thoracic spine MRI. Review of the MIP images confirms the above findings. IMPRESSION: 1. Acute segmental and subsegmental pulmonary emboli within the right upper lobe. The lower lobe pulmonary arteries are not well evaluated due to extensive motion artifact. There is CT evidence of right heart strain with RV/LV Ratio of 0.92 and reflux of contrast into the hepatic veins, consistent with at least submassive (intermediate risk) PE. The presence of right heart strain has been associated with an increased risk of morbidity and mortality. Please activate Code PE by paging [phone]. 2. Patchy nodularity in the peripheral right upper lobe may be infectious or inflammatory. Alternatively, this could represent early pulmonary infarct. 3.  Aortic atherosclerosis (ICD10-I70.0). Radiology assistant personnel have been notified to put me in telephone contact with the referring physician or the referring physician's clinical representative in order to discuss these findings. Once this communication is established I will issue an addendum to this report for documentation purposes. Electronically Signed: By: Obie DredgeWilliam T Derry M.D. On: 06/24/2017 13:53    Cardiac Studies   none  Patient Profile     83 y.o. female with history of hypertension has been experiencing exertional shortness of breath over the last 2 weeks. Patient also has been having some upper back pain. Denies any fever chills productive cough. Patient had gone to her cardiologist yesterday for further assessment. D-dimer was done which was positive and patient had CT angiogram of the chest done which was positive  for pulmonary embolism with right ventricular strain. Patient was referred to the ER.  Noted to be in second-degree Mobitz type II heart block in the emergency room.  Cardiology consulted.  Assessment & Plan    1.  Acute pulmonary embolism verified on chest CT angio with associated RV strain. -2D echocardiogram pending -Continue IV heparin drip  2.  Second-degree AV block Mobitz type I and II -she is not on any AV nodal blocking agents is it picking out some time now because it just picked up some use. -Heart rate has been in the 35-40s with stable blood pressure -We will continue to follow on telemetry -We will ask EP to see in the morning for  pacemaker placement  3.  Hypertension -BP adequately controlled on HCTZ and irbesartan  For questions or updates, please contact [facility] Please consult [email or web address] for contact info under Cardiology/STEMI.      Signed, Armanda Magicraci Tanecia Mccay, MD  06/25/2017, 10:50 AM

## 2017-06-25 NOTE — Progress Notes (Signed)
Patient is hypertensive, not tachycardic, on room air and in no distress.  No indication for EKOS.  PCCM will sign off.  Alyson ReedyWesam G. Yacoub, M.D. [facility]. Pager: [phone]. After hours pager: [phone].

## 2017-06-26 ENCOUNTER — Inpatient Hospital Stay (HOSPITAL_COMMUNITY): Payer: Medicare Other

## 2017-06-26 ENCOUNTER — Encounter (HOSPITAL_COMMUNITY): Payer: Self-pay | Admitting: Family Medicine

## 2017-06-26 DIAGNOSIS — I340 Nonrheumatic mitral (valve) insufficiency: Secondary | ICD-10-CM

## 2017-06-26 DIAGNOSIS — I82442 Acute embolism and thrombosis of left tibial vein: Secondary | ICD-10-CM

## 2017-06-26 DIAGNOSIS — I2782 Chronic pulmonary embolism: Secondary | ICD-10-CM

## 2017-06-26 DIAGNOSIS — I2609 Other pulmonary embolism with acute cor pulmonale: Secondary | ICD-10-CM

## 2017-06-26 HISTORY — DX: Acute embolism and thrombosis of left tibial vein: I82.442

## 2017-06-26 LAB — CBC
HCT: 35.3 % — ABNORMAL LOW (ref 36.0–46.0)
Hemoglobin: 11.1 g/dL — ABNORMAL LOW (ref 12.0–15.0)
MCH: 26.6 pg (ref 26.0–34.0)
MCHC: 31.4 g/dL (ref 30.0–36.0)
MCV: 84.7 fL (ref 78.0–100.0)
PLATELETS: 228 10*3/uL (ref 150–400)
RBC: 4.17 MIL/uL (ref 3.87–5.11)
RDW: 15.1 % (ref 11.5–15.5)
WBC: 6.2 10*3/uL (ref 4.0–10.5)

## 2017-06-26 LAB — ECHOCARDIOGRAM COMPLETE
Height: 64 in
Weight: 2784 oz

## 2017-06-26 LAB — HEPARIN LEVEL (UNFRACTIONATED): HEPARIN UNFRACTIONATED: 0.55 [IU]/mL (ref 0.30–0.70)

## 2017-06-26 MED ORDER — INFLUENZA VAC SPLIT HIGH-DOSE 0.5 ML IM SUSY
0.5000 mL | PREFILLED_SYRINGE | INTRAMUSCULAR | Status: AC | PRN
  Administered 2017-06-28: 0.5 mL via INTRAMUSCULAR
  Filled 2017-06-26: qty 0.5

## 2017-06-26 NOTE — Progress Notes (Signed)
ANTICOAGULATION CONSULT NOTE - Follow Up Consult  Pharmacy Consult for Heparin Indication: pulmonary embolus  No Known Allergies  Patient Measurements: Height: 5\' 4"  (162.6 cm) Weight: 174 lb (78.9 kg) IBW/kg (Calculated) : 54.7 Heparin Dosing Weight:  71.5 kg  Vital Signs: Temp: 98 F (36.7 C) (11/04 0838) Temp Source: Oral (11/04 0838) BP: 130/79 (11/04 0838) Pulse Rate: 39 (11/04 0838)  Labs: Recent Labs    06/24/17 1440 06/24/17 2144 06/25/17 0319 06/25/17 1120 06/26/17 0148  HGB 11.9*  --  11.0*  --  11.1*  HCT 37.1  --  35.7*  --  35.3*  PLT 238  --  222  --  228  LABPROT 13.4  --   --   --   --   INR 1.03  --   --   --   --   HEPARINUNFRC  --  0.79*  --  0.63 0.55  CREATININE 0.83  --  0.74  --   --   TROPONINI <0.03 <0.03 <0.03 <0.03  --     Estimated Creatinine Clearance: 48.5 mL/min (by C-G formula based on SCr of 0.74 mg/dL).   Assessment: Anticoag: Heparin for PE with RHS, L leg + DVT, d-dimer 3.5 - HL 0.55, CBC stable overnight  Goal of Therapy:  Heparin level 0.3-0.7 units/ml Monitor platelets by anticoagulation protocol: Yes   Plan:  Continue heparin at 1050 units/hr Daily heparin level and CBC F/U [facility] EP consult for pacer placement  Shateka Petrea S. Merilynn Finlandobertson, PharmD, [facility] Clinical Staff Pharmacist Pager [phone]  Misty Stanleyobertson, Harriett Azar Stillinger 06/26/2017,11:00 AM

## 2017-06-26 NOTE — Progress Notes (Signed)
Progress Note  Patient Name: Lindsey Ross Date of Encounter: 06/26/2017  Primary Cardiologist: Dr. Basilio Cairo  Subjective   No complaints today - remains in second degree AV block  Inpatient Medications    Scheduled Meds: . gabapentin  200 mg Oral TID  . hydrochlorothiazide  25 mg Oral Daily  . irbesartan  300 mg Oral Daily   Continuous Infusions: . heparin 1,050 Units/hr (06/26/17 0835)   PRN Meds: acetaminophen **OR** acetaminophen, albuterol, hydrALAZINE, Influenza vac split quadrivalent PF   Vital Signs    Vitals:   06/26/17 0035 06/26/17 0507 06/26/17 0838 06/26/17 1545  BP: (!) 149/73 (!) 144/60 130/79 (!) 152/71  Pulse:   (!) 39 91  Resp:   20 (!) 28  Temp:  98.3 F (36.8 C) 98 F (36.7 C) 98.1 F (36.7 C)  TempSrc:  Oral Oral Oral  SpO2:   94% 96%  Weight:      Height:       No intake or output data in the 24 hours ending 06/26/17 1619 Filed Weights   06/24/17 1425  Weight: 174 lb (78.9 kg)    Telemetry    NSR with second degree AV block - Personally Reviewed  ECG    No new EKG to review - Personally Reviewed  Physical Exam   GEN: No acute distress.   Neck: No JVD Cardiac: RRR, no murmurs, rubs, or gallops.  Respiratory: Clear to auscultation bilaterally. GI: Soft, nontender, non-distended  MS: No edema; No deformity. Neuro:  Nonfocal  Psych: Normal affect   Labs    Chemistry Recent Labs  Lab 06/24/17 1440 06/25/17 0319  NA 137 140  K 4.6 3.7  CL 103 106  CO2 27 25  GLUCOSE 119* 104*  BUN 30* 20  CREATININE 0.83 0.74  CALCIUM 9.9 9.1  PROT 7.9  --   ALBUMIN 4.2  --   AST 22  --   ALT 12*  --   ALKPHOS 47  --   BILITOT 0.7  --   GFRNONAA >60 >60  GFRAA >60 >60  ANIONGAP 7 9     Hematology Recent Labs  Lab 06/24/17 1440 06/25/17 0319 06/26/17 0148  WBC 6.7 6.3 6.2  RBC 4.42 4.22 4.17  HGB 11.9* 11.0* 11.1*  HCT 37.1 35.7* 35.3*  MCV 83.9 84.6 84.7  MCH 26.9 26.1 26.6  MCHC 32.1 30.8 31.4  RDW 15.0 15.2  15.1  PLT 238 222 228    Cardiac Enzymes Recent Labs  Lab 06/24/17 1440 06/24/17 2144 06/25/17 0319 06/25/17 1120  TROPONINI <0.03 <0.03 <0.03 <0.03   No results for input(s): TROPIPOC in the last 168 hours.   BNP Recent Labs  Lab 06/24/17 2146  BNP 342.1*     DDimer  Recent Labs  Lab 06/23/17 1113  DDIMER 3.50*     Radiology    No results found.  Cardiac Studies   2D echo Study Conclusions  - Left ventricle: The cavity size was normal. There was moderate   concentric hypertrophy. Systolic function was normal. The   estimated ejection fraction was in the range of 60% to 65%. Wall   motion was normal; there were no regional wall motion   abnormalities. Doppler parameters are consistent with abnormal   left ventricular relaxation (grade 1 diastolic dysfunction). The   E/e&' ratio is >15, suggesting elevated LV filling pressure. - Aortic valve: Mildly calcified leaflets. There was no stenosis.   There was no significant regurgitation. - Mitral valve:  Heavy MAC. Mild regurgitation. Valve area by   continuity equation (using LVOT flow): 2.08 cm^2. - Left atrium: Severely dilated. - Right ventricle: The cavity size was mildly dilated. Systolic   function was normal. - Right atrium: The atrium was mildly dilated. - Atrial septum: No defect or patent foramen ovale was identified. - Tricuspid valve: There was moderate regurgitation. - Pulmonary arteries: PA peak pressure: 77 mm Hg (S). - Inferior vena cava: The vessel was dilated. The respirophasic   diameter changes were blunted (< 50%), consistent with elevated   central venous pressure.  Impressions:  - LVEF 60-65%, moderate LVH, normal wall motion, grade 1 DD,   elevated LV filling pressure, mild aortic valve calcification,   heavy MAC with mild MR, severe LAE, mild RAE, mild RVE with   normal RV function, moderate TR, RVSP 77 mmHg, dilated IVC, no   pericardial effusion.   Patient Profile     83  y.o. female with history of hypertension has been experiencing exertional shortness of breath over the last 2 weeks. Patient also has been having some upper back pain. Denies any fever chills productive cough. Patient had gone to her cardiologist yesterday for further assessment. D-dimer was done which was positive and patient had CT angiogram of the chest done which was positive for pulmonary embolism with right ventricular strain. Patient was referred to the ER.  Noted to be in second-degree Mobitz type II heart block in the emergency room.  Cardiology consulted.  Assessment & Plan    1.  Acute pulmonary embolism verified on chest CT angio with associated RV strain. -Continue IV heparin drip - 2D echo showed normal LVF with EF 60-65% with increased filling pressures, mild MR, severe LAE, mildly dilated RV with normal RV function, moderate TR and severe Pulmonary HTN with PASP 42mHg  2.  Second-degree AV block Mobitz type I and II -she is not on any AV nodal blocking agents . -Heart rate has been in the 35-40s with stable blood pressure -We will continue to follow on telemetry -We will ask EP to see in the morning for  pacemaker placement  3.  Hypertension -BP adequately controlled on HCTZ and irbesartan   For questions or updates, please contact [place]Please consult [email or web address] for contact info under Cardiology/STEMI.      Signed, TFransico Him MD  06/26/2017, 4:19 PM

## 2017-06-26 NOTE — Progress Notes (Signed)
  Echocardiogram 2D Echocardiogram has been performed.  [name], Lindsey Ross 06/26/2017, 11:06 AM

## 2017-06-26 NOTE — Progress Notes (Signed)
PROGRESS NOTE  Lindsey Ross GQB:[id number] DOB: April 09, 1925 DOA: 06/24/2017 PCP: [name], MD  Brief Narrative: 89yow seen by cardiology outpatient 11/1 for SOB. D-dimer was positive and pt sent for CTA which showed PE with potential RV strain >> sent to ED. Found to have bradycardia with Mobitz II  Assessment/Plan Acute segmental and subsegmental pulmonary emboli within the right upper lobe with CT evidence of RV strain and CT findings to suggest intermediate risk PE. Echo reassuring with normal LV and RV systolic function.PCCM rec no EKOS. - stable. Continue heparin infusion pending EP evaluation.     Mobitz I/II. TSH WNL -asymptomatic -cardiology following, recommended EP eval    LLE acute DVT posterior tibial veins -continue heparin  Essential HTN -stable. Continue HCTZ and ARB.  Aortic atherosclerosis -asymptomatic. Consider statin as outpatient if benefit > risk.   DVT prophylaxis: heparin infusion Code Status: full Family Communication: husband, daughter and son-in-law at bedside Disposition Plan: return home   Lindsey Hodgkins, MD  [facility] Direct contact: [phone] --[place]  --[email or web address]; password [email or web address]  7PM-7AM contact night coverage as above 06/26/2017, 2:40 PM  LOS: 2 days   Consultants:  Cardiology  PCCM  Procedures:  Echo Study Conclusions  - Left ventricle: The cavity size was normal. There was moderate   concentric hypertrophy. Systolic function was normal. The   estimated ejection fraction was in the range of 60% to 65%. Wall   motion was normal; there were no regional wall motion   abnormalities. Doppler parameters are consistent with abnormal   left ventricular relaxation (grade 1 diastolic dysfunction). The   E/e&' ratio is >15, suggesting elevated LV filling pressure. - Aortic valve: Mildly calcified leaflets. There was no stenosis.   There was no significant regurgitation. - Mitral valve: Heavy MAC.  Mild regurgitation. Valve area by   continuity equation (using LVOT flow): 2.08 cm^2. - Left atrium: Severely dilated. - Right ventricle: The cavity size was mildly dilated. Systolic   function was normal. - Right atrium: The atrium was mildly dilated. - Atrial septum: No defect or patent foramen ovale was identified. - Tricuspid valve: There was moderate regurgitation. - Pulmonary arteries: PA peak pressure: 77 mm Hg (S). - Inferior vena cava: The vessel was dilated. The respirophasic   diameter changes were blunted (< 50%), consistent with elevated   central venous pressure.  Impressions:  - LVEF 60-65%, moderate LVH, normal wall motion, grade 1 DD,   elevated LV filling pressure, mild aortic valve calcification,   heavy MAC with mild MR, severe LAE, mild RAE, mild RVE with   normal RV function, moderate TR, RVSP 77 mmHg, dilated IVC, no   pericardial effusion.  Antimicrobials:    Interval history/Subjective: Bradycardic at times.  Feels ok, no SOB or pain.  Objective: Vitals:  Vitals:   06/26/17 0507 06/26/17 0838  BP: (!) 144/60 130/79  Pulse:  (!) 39  Resp:  20  Temp: 98.3 F (36.8 C) 98 F (36.7 C)  SpO2:  94%     Exam:  Constitutional:  . Appears calm and comfortable ENMT:  . Hard of hearing  Respiratory:  . CTA bilaterally, no w/r/r.  . Respiratory effort normal. No retractions or accessory muscle use Cardiovascular:  . RRR, no m/r/g . No LE extremity edema   Psychiatric:  . judgement and insight appear normal . Mental status o Mood, affect appropriate o Orientation to person, place, time    I have personally reviewed the following:  Labs:  Hgb stable 11.1  Imaging studies:   bilateral LE venous ultrasound noted  Scheduled Meds: . gabapentin  200 mg Oral TID  . hydrochlorothiazide  25 mg Oral Daily  . irbesartan  300 mg Oral Daily   Continuous Infusions: . heparin 1,050 Units/hr (06/26/17 0835)    Principal Problem:    Pulmonary embolism (HCC) Active Problems:   Essential hypertension   Heart block AV second degree   Aortic atherosclerosis ([place])   LOS: 2 days

## 2017-06-27 ENCOUNTER — Other Ambulatory Visit: Payer: Self-pay

## 2017-06-27 LAB — CBC
HCT: 35.5 % — ABNORMAL LOW (ref 36.0–46.0)
HEMOGLOBIN: 11.1 g/dL — AB (ref 12.0–15.0)
MCH: 26.4 pg (ref 26.0–34.0)
MCHC: 31.3 g/dL (ref 30.0–36.0)
MCV: 84.5 fL (ref 78.0–100.0)
Platelets: 249 10*3/uL (ref 150–400)
RBC: 4.2 MIL/uL (ref 3.87–5.11)
RDW: 14.9 % (ref 11.5–15.5)
WBC: 7.2 10*3/uL (ref 4.0–10.5)

## 2017-06-27 LAB — HEPARIN LEVEL (UNFRACTIONATED): HEPARIN UNFRACTIONATED: 0.42 [IU]/mL (ref 0.30–0.70)

## 2017-06-27 MED ORDER — POLYETHYLENE GLYCOL 3350 17 G PO PACK
17.0000 g | PACK | Freq: Two times a day (BID) | ORAL | Status: DC
  Administered 2017-06-27: 17 g via ORAL
  Filled 2017-06-27 (×2): qty 1

## 2017-06-27 MED ORDER — SENNA 8.6 MG PO TABS
1.0000 | ORAL_TABLET | Freq: Every day | ORAL | Status: DC
  Filled 2017-06-27: qty 1

## 2017-06-27 NOTE — Progress Notes (Signed)
Progress Note  Patient Name: Lindsey Ross Date of Encounter: 06/27/2017  Primary Cardiologist: Dr. Basilio Cairo  Subjective   Pt denies CP or dyspnea; no syncope; fatigued for past several weeks  Inpatient Medications    Scheduled Meds: . gabapentin  200 mg Oral TID  . hydrochlorothiazide  25 mg Oral Daily  . irbesartan  300 mg Oral Daily  . polyethylene glycol  17 g Oral BID  . senna  1 tablet Oral QHS   Continuous Infusions: . heparin 1,050 Units/hr (06/26/17 0835)   PRN Meds: acetaminophen **OR** acetaminophen, albuterol, hydrALAZINE, Influenza vac split quadrivalent PF   Vital Signs    Vitals:   06/26/17 1545 06/26/17 2014 06/27/17 0457 06/27/17 0756  BP: (!) 152/71 (!) 142/78  (!) 151/70  Pulse: 91   61  Resp: (!) 28   15  Temp: 98.1 F (36.7 C) 98.4 F (36.9 C) 98.2 F (36.8 C) 98.1 F (36.7 C)  TempSrc: Oral Oral Oral Oral  SpO2: 96%  98% 94%  Weight:      Height:        Intake/Output Summary (Last 24 hours) at 06/27/2017 1125 Last data filed at 06/26/2017 1700 Gross per 24 hour  Intake 240 ml  Output -  Net 240 ml   Filed Weights   06/24/17 1425  Weight: 174 lb (78.9 kg)    Telemetry    Sinus with mobitz 1, 2:1 AV block and brief mobitz 2; HR 30s- Personally Reviewed   Physical Exam   GEN: WD/WN No acute distress.   Neck: No JVD, supple Cardiac: Bradycardic Respiratory: Clear to auscultation bilaterally; no wheeze GI: Soft, nontender, non-distended, no masses  MS: No edema Neuro:  Nonfocal; grossly intact   Labs    Chemistry Recent Labs  Lab 06/24/17 1440 06/25/17 0319  NA 137 140  K 4.6 3.7  CL 103 106  CO2 27 25  GLUCOSE 119* 104*  BUN 30* 20  CREATININE 0.83 0.74  CALCIUM 9.9 9.1  PROT 7.9  --   ALBUMIN 4.2  --   AST 22  --   ALT 12*  --   ALKPHOS 47  --   BILITOT 0.7  --   GFRNONAA >60 >60  GFRAA >60 >60  ANIONGAP 7 9     Hematology Recent Labs  Lab 06/25/17 0319 06/26/17 0148 06/27/17 0229  WBC 6.3  6.2 7.2  RBC 4.22 4.17 4.20  HGB 11.0* 11.1* 11.1*  HCT 35.7* 35.3* 35.5*  MCV 84.6 84.7 84.5  MCH 26.1 26.6 26.4  MCHC 30.8 31.4 31.3  RDW 15.2 15.1 14.9  PLT 222 228 249    Cardiac Enzymes Recent Labs  Lab 06/24/17 1440 06/24/17 2144 06/25/17 0319 06/25/17 1120  TROPONINI <0.03 <0.03 <0.03 <0.03    BNP Recent Labs  Lab 06/24/17 2146  BNP 342.1*     DDimer  Recent Labs  Lab 06/23/17 1113  DDIMER 3.50*     Cardiac Studies   2D echo Study Conclusions  - Left ventricle: The cavity size was normal. There was moderate   concentric hypertrophy. Systolic function was normal. The   estimated ejection fraction was in the range of 60% to 65%. Wall   motion was normal; there were no regional wall motion   abnormalities. Doppler parameters are consistent with abnormal   left ventricular relaxation (grade 1 diastolic dysfunction). The   E/e&' ratio is >15, suggesting elevated LV filling pressure. - Aortic valve: Mildly calcified leaflets. There was  no stenosis.   There was no significant regurgitation. - Mitral valve: Heavy MAC. Mild regurgitation. Valve area by   continuity equation (using LVOT flow): 2.08 cm^2. - Left atrium: Severely dilated. - Right ventricle: The cavity size was mildly dilated. Systolic   function was normal. - Right atrium: The atrium was mildly dilated. - Atrial septum: No defect or patent foramen ovale was identified. - Tricuspid valve: There was moderate regurgitation. - Pulmonary arteries: PA peak pressure: 77 mm Hg (S). - Inferior vena cava: The vessel was dilated. The respirophasic   diameter changes were blunted (< 50%), consistent with elevated   central venous pressure.  Impressions:  - LVEF 60-65%, moderate LVH, normal wall motion, grade 1 DD,   elevated LV filling pressure, mild aortic valve calcification,   heavy MAC with mild MR, severe LAE, mild RAE, mild RVE with   normal RV function, moderate TR, RVSP 77 mmHg, dilated  IVC, no   pericardial effusion.   Patient Profile     83 y.o. female with history of hypertension has been experiencing exertional shortness of breath over the last 2 weeks. Patient also has been having some upper back pain.D-dimer was done which was positive and patient had CT angiogram of the chest done which was positive for pulmonary embolism with right ventricular strain. Patient was referred to the ER.  Noted to have bradycardia and cardiology consulted.  Assessment & Plan    1.  Acute pulmonary embolism/DVT verified on chest CT angio with associated RV strain. -Continue IV heparin drip; pt will need transition to apixaban or xeralto at DC. - 2D echo showed normal LVF with EF 60-65% with increased filling pressures, mild MR, severe LAE, mildly dilated RV with normal RV function, moderate TR and severe Pulmonary HTN with PASP 60mHg  2.  Second-degree AV block Mobitz type I and II  No AV nodal blocking agents; reviewed telemetry; she has mobitz 1, 2:1 and brief mobitz 2; HR 30's with 2:1 block during evaluation; will ask EP to review for pacemaker. Issue is complicated by recent PE and need for anticoagulation.  3.  Hypertension -BP Mildly elevated. Follow and increase regimen as needed.   For questions or updates, please contact [place]Please consult [email or web address] for contact info under Cardiology/STEMI.      Signed, BKirk Ruths MD  06/27/2017, 11:25 AM

## 2017-06-27 NOTE — Progress Notes (Signed)
PROGRESS NOTE  Lindsey Ross EHU:[id number] DOB: 03-Dec-1924 DOA: 06/24/2017 PCP: [name], MD  Brief Narrative: 89yow seen by cardiology outpatient 11/1 for SOB. D-dimer was positive and pt sent for CTA which showed PE with potential RV strain >> sent to ED. Found to have bradycardia with Mobitz II  Assessment/Plan Acute segmental and subsegmental pulmonary emboli within the right upper lobe with CT evidence of RV strain and CT findings to suggest intermediate risk PE. Echo reassuring with normal LV and RV systolic function.PCCM rec no EKOS. - stable. Continue heparin infusion 11/5 pending EP evaluation.     Mobitz I/II. TSH WNL - asymptomatic - f/u EP recs  LLE acute DVT posterior tibial veins - continue heparin 11/5  Essential HTN -stable. Continue HCTZ and ARB.  Aortic atherosclerosis -asymptomatic. Consider statin as outpatient if benefit > risk.  Change oral anticoagulant after EP procedure  DVT prophylaxis: heparin infusion Code Status: full Family Communication: husband, daughter and son-in-law at bedside Disposition Plan: return home   [name], MD  [facility] Direct contact: [phone] --[place] [place]  --[email or web address]; password [email or web address]  7PM-7AM contact night coverage as above 06/27/2017, 10:34 AM  LOS: 3 days   Consultants:  Cardiology  PCCM  Procedures:  Echo Study Conclusions  - Left ventricle: The cavity size was normal. There was moderate   concentric hypertrophy. Systolic function was normal. The   estimated ejection fraction was in the range of 60% to 65%. Wall   motion was normal; there were no regional wall motion   abnormalities. Doppler parameters are consistent with abnormal   left ventricular relaxation (grade 1 diastolic dysfunction). The   E/e&' ratio is >15, suggesting elevated LV filling pressure. - Aortic valve: Mildly calcified leaflets. There was no stenosis.   There was no significant  regurgitation. - Mitral valve: Heavy MAC. Mild regurgitation. Valve area by   continuity equation (using LVOT flow): 2.08 cm^2. - Left atrium: Severely dilated. - Right ventricle: The cavity size was mildly dilated. Systolic   function was normal. - Right atrium: The atrium was mildly dilated. - Atrial septum: No defect or patent foramen ovale was identified. - Tricuspid valve: There was moderate regurgitation. - Pulmonary arteries: PA peak pressure: 77 mm Hg (S). - Inferior vena cava: The vessel was dilated. The respirophasic   diameter changes were blunted (< 50%), consistent with elevated   central venous pressure.  Impressions:  - LVEF 60-65%, moderate LVH, normal wall motion, grade 1 DD,   elevated LV filling pressure, mild aortic valve calcification,   heavy MAC with mild MR, severe LAE, mild RAE, mild RVE with   normal RV function, moderate TR, RVSP 77 mmHg, dilated IVC, no   pericardial effusion.  Antimicrobials:    Interval history/Subjective: No issues charted overnight.  No CP, SOB or dizziness.  Objective: Vitals:  Vitals:   06/27/17 0457 06/27/17 0756  BP:  (!) 151/70  Pulse:  61  Resp:  15  Temp: 98.2 F (36.8 C) 98.1 F (36.7 C)  SpO2: 98% 94%     Exam:  Constitutional:  . Appears calm and comfortable Eyes:  . pupils and irises appear normal ENMT:  . grossly normal hearing  . Lips appear normal Respiratory:  . CTA bilaterally, no w/r/r.  . Respiratory effort normal.  Cardiovascular:  . bradycardic, no m/r/g . No LE extremity edema   Psychiatric:  . judgement and insight appear normal . Mental status o Mood, affect appropriate   I have  personally reviewed the following:   UOP: unrecorded I/O since admission:  Last BM charted:  Foley: no Telemetry: SB Status: change to tele post PM  Labs:  Hgb stable 11.1 11/5  Imaging studies:    Scheduled Meds: . gabapentin  200 mg Oral TID  . hydrochlorothiazide  25 mg Oral Daily   . irbesartan  300 mg Oral Daily   Continuous Infusions: . heparin 1,050 Units/hr (06/26/17 0835)    Principal Problem:   Pulmonary embolism (HCC) Active Problems:   Essential hypertension   Heart block AV second degree   Aortic atherosclerosis (HCC)   Acute deep vein thrombosis (DVT) of left tibial vein (HCC)   LOS: 3 days

## 2017-06-27 NOTE — Progress Notes (Signed)
ANTICOAGULATION CONSULT NOTE - Follow Up Consult  Pharmacy Consult for Heparin Indication: pulmonary embolus and DVT  No Known Allergies  Patient Measurements: Height: 5\' 4"  (162.6 cm) Weight: 174 lb (78.9 kg) IBW/kg (Calculated) : 54.7 Heparin Dosing Weight:  71.5 kg  Vital Signs: Temp: 98.1 F (36.7 C) (11/05 0756) Temp Source: Oral (11/05 0756) BP: 151/70 (11/05 0756) Pulse Rate: 61 (11/05 0756)  Labs: Recent Labs    06/24/17 1440  06/24/17 2144 06/25/17 0319 06/25/17 1120 06/26/17 0148 06/27/17 0229  HGB 11.9*  --   --  11.0*  --  11.1* 11.1*  HCT 37.1  --   --  35.7*  --  35.3* 35.5*  PLT 238  --   --  222  --  228 249  LABPROT 13.4  --   --   --   --   --   --   INR 1.03  --   --   --   --   --   --   HEPARINUNFRC  --    < > 0.79*  --  0.63 0.55 0.42  CREATININE 0.83  --   --  0.74  --   --   --   TROPONINI <0.03  --  <0.03 <0.03 <0.03  --   --    < > = values in this interval not displayed.    Estimated Creatinine Clearance: 48.5 mL/min (by C-G formula based on SCr of 0.74 mg/dL).   Assessment: Anticoag: Heparin for PE with RHS, L leg + DVT, d-dimer 3.5 - HL 0.42, CBC stable, no bleeding noted  Goal of Therapy:  Heparin level 0.3-0.7 units/ml Monitor platelets by anticoagulation protocol: Yes   Plan:  Continue heparin drip at 1050 units/hr Daily heparin level and CBC [facility] EP consult for pacer placement pending   Loura BackJennifer Bossier, PharmD, BCPS Clinical Pharmacist Phone for today [phone]- [phone] Main pharmacy - [phone] 06/27/2017 10:17 AM

## 2017-06-27 NOTE — Consult Note (Signed)
[facility] CM Primary Care Navigator  06/27/2017  JOETTA DELPRADO 08-27-1924 [id number]   Met with patient, husband Marcello Moores), daughter and son in-lawat the bedside to identify possible discharge needs. Patient gave permission to discuss health information in their presence. Patient reports feeling fatigued and having shortness of breath that had led to this admission.  Patient endorses Dr. [name] with [facility] as the primary care provider.   Patient shared using [facility] pharmacy [facility] [address] and [facility] service to obtain medications without any problem.   Patient states managing her ownmedications at home with use of "pill box" system filled weekly.  Patient reports that husband or daughters Vickii Chafe and Arbie Cookey) provide transportation to her doctors' appointments.  Patient verbalized that husband is her primary caregiver at home.   Anticipated discharge plan is home when stable per patient.  Patient expressed understanding to call primary care provider's office for a post discharge follow-up appointment within a week or sooner if needs arise.Patient letter (with PCP's contact number) was provided as a reminder.  Explained to patient and family about [facility] CM services available for health management at home but she denies any needs or concerns at this time. Patient voiced understandingto seek referral from primary care provider to [facility] care management if necessary and appropriate for services in the future.  [facility] care management information provided for future needs that may arise.  Patient however, had verbally agreed and opted for EMMI calls to follow-upwith recovery at home.  Referral made forEMMI General calls after discharge.   For questions, please contact:  Dannielle Huh, BSN, RN- [facility] Primary Care Navigator  Telephone: [phone] [place]

## 2017-06-27 NOTE — Consult Note (Signed)
Cardiology Consultation:   Patient ID: Lindsey Ross; [id number]; March 09, 1925   Admit date: 06/24/2017 Date of Consult: 06/27/2017  Primary Care Provider: [name], MD Primary Cardiologist: Dr. Geraldo Pitter   Patient Profile:   Lindsey Ross is a 83 y.o. female with a hx of HTN, arthritis, who is being seen today for the evaluation of heart bloxk at the request of Dr. Radford Pax.  History of Present Illness:   [name] was seen in the OP setting on 06/23/17 by Dr. Geraldo Pitter with c/o a couple weeks of new DOE/SOB, she had a fall a few months prior apparently not feeling quite as well as usual since then.  Her PMD referred her to cardiology 2/2 bradycardia and her SOB.    AT his visit his note reports EKG with SR, PACs (I don't see the image in Epic yet), planned for Ddimer, echo and holter.  Her DdImer was abnormal and sent to the ER, subsequently her CT positive for significant PE w/right heart strain, admitted and started on heparin gtt.  Here she was noted to be bradycardic.  The patient tells me her fall was in January, she broke her R wrist and exacerbated back pain and since then just has not felt like she ever recovered completely with ongoing issues with her back primarily.  About a month ago she felt like she was having unusual fatigue, having to take breaks with her usual activities to sit and "collect myself", feeling weak, but denies any near syncope or syncope, maybe some degree of lightheadedness and unusually SOB.  No rest SOB, no resting weak spells.  2 weeks ago this worsened and she sought attention as noted above.  LABS K+ 3.7 BUN/Creat 20/0.74 Trop I: <0.03 x3 WBC 7.2 H/H 11/35 plts 249 TSH 2.775  Past Medical History:  Diagnosis Date  . Acute deep vein thrombosis (DVT) of left tibial vein ([place]) 06/26/2017  . Aortic atherosclerosis ([place]) 06/25/2017  . Arthritis   . Burning sensation of feet    Bilateral feet  . Frequent urination   . Heart block AV  second degree 06/24/2017  . Hypertension   . Pulmonary embolism ([place]) 06/24/2017  . Shortness of breath dyspnea    with exertion    Past Surgical History:  Procedure Laterality Date  . ABDOMINAL HYSTERECTOMY    . ANKLE FRACTURE SURGERY Right 2003  . CHOLECYSTECTOMY    . EYE SURGERY Bilateral    cataract removal       Inpatient Medications: Scheduled Meds: . gabapentin  200 mg Oral TID  . hydrochlorothiazide  25 mg Oral Daily  . irbesartan  300 mg Oral Daily  . polyethylene glycol  17 g Oral BID  . senna  1 tablet Oral QHS   Continuous Infusions: . heparin 1,050 Units/hr (06/26/17 0835)   PRN Meds: acetaminophen **OR** acetaminophen, albuterol, hydrALAZINE, Influenza vac split quadrivalent PF  Allergies:   No Known Allergies  Social History:   Social History   Socioeconomic History  . Marital status: Married    Spouse name: Not on file  . Number of children: Not on file  . Years of education: Not on file  . Highest education level: Not on file  Social Needs  . Financial resource strain: Not on file  . Food insecurity - worry: Not on file  . Food insecurity - inability: Not on file  . Transportation needs - medical: Not on file  . Transportation needs - non-medical: Not on file  Occupational  History  . Not on file  Tobacco Use  . Smoking status: Never Smoker  . Smokeless tobacco: Never Used  Substance and Sexual Activity  . Alcohol use: No  . Drug use: No  . Sexual activity: Not on file  Other Topics Concern  . Not on file  Social History Narrative  . Not on file    Family History:   Family History  Problem Relation Age of Onset  . Pulmonary embolism Neg Hx      ROS:  Please see the history of present illness.  ROS  All other ROS reviewed and negative.     Physical Exam/Data:   Vitals:   06/26/17 1545 06/26/17 2014 06/27/17 0457 06/27/17 0756  BP: (!) 152/71 (!) 142/78  (!) 151/70  Pulse: 91   61  Resp: (!) 28   15  Temp: 98.1 F (36.7 C)  98.4 F (36.9 C) 98.2 F (36.8 C) 98.1 F (36.7 C)  TempSrc: Oral Oral Oral Oral  SpO2: 96%  98% 94%  Weight:      Height:        Intake/Output Summary (Last 24 hours) at 06/27/2017 1120 Last data filed at 06/26/2017 1700 Gross per 24 hour  Intake 240 ml  Output -  Net 240 ml   Filed Weights   06/24/17 1425  Weight: 174 lb (78.9 kg)   Body mass index is 29.87 kg/m.  General:  Well nourished, well developed, in no acute distress HEENT: normal Lymph: no adenopathy Neck: no JVD Endocrine:  No thryomegaly Vascular: No carotid bruits, LE warm, no cyanosis Cardiac:  RRR; no murmurs, gallops rubs Lungs:  clear to auscultation bilaterally, no wheezing, rhonchi or rales  Abd: soft, nontender Ext: no edema Musculoskeletal:  No deformities, age appropriate atrophy Skin: warm and dry  Neuro:  No gross focal abnormalities noted Psych:  Normal affect   EKG:  The EKG was personally reviewed and demonstrates:   06/24/17 is SR, V rate 43bpm, 2nd degree AV block, looks like type one, iRBBB, LAD 11/27/16 is SR 86bpm, iRBBB, LAD, PR 1107m, QRS 1137mTelemetry:  Telemetry was personally reviewed and demonstrates:   SR, Mobitz I,  2:1  Relevant CV Studies:  06/26/17: TTE Study Conclusions - Left ventricle: The cavity size was normal. There was moderate   concentric hypertrophy. Systolic function was normal. The   estimated ejection fraction was in the range of 60% to 65%. Wall   motion was normal; there were no regional wall motion   abnormalities. Doppler parameters are consistent with abnormal   left ventricular relaxation (grade 1 diastolic dysfunction). The   E/e&' ratio is >15, suggesting elevated LV filling pressure. - Aortic valve: Mildly calcified leaflets. There was no stenosis.   There was no significant regurgitation. - Mitral valve: Heavy MAC. Mild regurgitation. Valve area by   continuity equation (using LVOT flow): 2.08 cm^2. - Left atrium: Severely dilated. (4860m-  Right ventricle: The cavity size was mildly dilated. Systolic   function was normal. - Right atrium: The atrium was mildly dilated. - Atrial septum: No defect or patent foramen ovale was identified. - Tricuspid valve: There was moderate regurgitation. - Pulmonary arteries: PA peak pressure: 77 mm Hg (S). - Inferior vena cava: The vessel was dilated. The respirophasic   diameter changes were blunted (< 50%), consistent with elevated   central venous pressure. Impressions: - LVEF 60-65%, moderate LVH, normal wall motion, grade 1 DD,   elevated LV filling pressure, mild aortic  valve calcification,   heavy MAC with mild MR, severe LAE, mild RAE, mild RVE with   normal RV function, moderate TR, RVSP 77 mmHg, dilated IVC, no   pericardial effusion.   Laboratory Data:  Chemistry Recent Labs  Lab 06/24/17 1440 06/25/17 0319  NA 137 140  K 4.6 3.7  CL 103 106  CO2 27 25  GLUCOSE 119* 104*  BUN 30* 20  CREATININE 0.83 0.74  CALCIUM 9.9 9.1  GFRNONAA >60 >60  GFRAA >60 >60  ANIONGAP 7 9    Recent Labs  Lab 06/24/17 1440  PROT 7.9  ALBUMIN 4.2  AST 22  ALT 12*  ALKPHOS 47  BILITOT 0.7   Hematology Recent Labs  Lab 06/25/17 0319 06/26/17 0148 06/27/17 0229  WBC 6.3 6.2 7.2  RBC 4.22 4.17 4.20  HGB 11.0* 11.1* 11.1*  HCT 35.7* 35.3* 35.5*  MCV 84.6 84.7 84.5  MCH 26.1 26.6 26.4  MCHC 30.8 31.4 31.3  RDW 15.2 15.1 14.9  PLT 222 228 249   Cardiac Enzymes Recent Labs  Lab 06/24/17 1440 06/24/17 2144 06/25/17 0319 06/25/17 1120  TROPONINI <0.03 <0.03 <0.03 <0.03   No results for input(s): TROPIPOC in the last 168 hours.  BNP Recent Labs  Lab 06/24/17 2146  BNP 342.1*    DDimer  Recent Labs  Lab 06/23/17 1113  DDIMER 3.50*    Radiology/Studies:  Ct Angio Chest Pe W Or Wo Contrast Addendum Date: 06/24/2017   ADDENDUM REPORT: 06/24/2017 14:13 ADDENDUM: Critical Value/emergent results were called by telephone on 06/24/2017 at 2:12 pm to Dr. Jyl Heinz , who verbally acknowledged these results. Electronically Signed   By: Titus Dubin M.D.   On: 06/24/2017 14:13   Result Date: 06/24/2017 CLINICAL DATA:  Shortness of breath with exertion. Elevated D-dimer. Evaluate for pulmonary embolism. EXAM: CT ANGIOGRAPHY CHEST WITH CONTRAST TECHNIQUE: Multidetector CT imaging of the chest was performed using the standard protocol during bolus administration of intravenous contrast. Multiplanar CT image reconstructions and MIPs were obtained to evaluate the vascular anatomy. CONTRAST:  100 cc Isovue 370 intravenous contrast. COMPARISON:  MRI thoracic spine dated December 05, 2016. FINDINGS: Cardiovascular: Acute segmental and subsegmental pulmonary emboli within the right upper lobe. The lower lobe pulmonary arteries are not well evaluated due to extensive motion artifact. Evidence of right heart strain with RV/LV ratio measuring 0.92, and reflux of contrast into the hepatic veins. Mild cardiomegaly. No pericardial effusion. Normal caliber thoracic aorta. Coronary, aortic arch, and branch vessel atherosclerotic vascular disease. Dense concentric calcification of the mitral valve. Mediastinum/Nodes: No enlarged mediastinal, hilar, or axillary lymph nodes. Thyroid gland, trachea, and esophagus demonstrate no significant findings. Lungs/Pleura: Patchy nodularity in the peripheral right upper lobe. Trace left pleural effusion. Bibasilar subsegmental atelectasis. No pneumothorax or consolidation. No suspicious pulmonary nodule. Upper Abdomen: No acute abnormality. Large hiatal hernia. Stable 1.6 x 2.6 cm cystic lesion in the pancreatic body. Musculoskeletal: No chest wall abnormality. No acute or significant osseous findings. Multiple chronic compression deformities of the thoracic spine, unchanged when compared to prior thoracic spine MRI. Review of the MIP images confirms the above findings. IMPRESSION: 1. Acute segmental and subsegmental pulmonary emboli within the  right upper lobe. The lower lobe pulmonary arteries are not well evaluated due to extensive motion artifact. There is CT evidence of right heart strain with RV/LV Ratio of 0.92 and reflux of contrast into the hepatic veins, consistent with at least submassive (intermediate risk) PE. The presence of right heart strain has  been associated with an increased risk of morbidity and mortality. Please activate Code PE by paging [phone]. 2. Patchy nodularity in the peripheral right upper lobe may be infectious or inflammatory. Alternatively, this could represent early pulmonary infarct. 3.  Aortic atherosclerosis (ICD10-I70.0). Radiology assistant personnel have been notified to put me in telephone contact with the referring physician or the referring physician's clinical representative in order to discuss these findings. Once this communication is established I Carleen Rhue issue an addendum to this report for documentation purposes. Electronically Signed: By: Titus Dubin M.D. On: 06/24/2017 13:53    Assessment and Plan:   1. Heart block     She has periods of 1:1 conduction, when loss occurs she has clear PR prolongation, though has prolonged periods of 2:1 conduction with HR 30's     She has some degree of baseline conduction system disease     No hx of syncope or near syncope      She is asymptomatic here, BP is good     No nodal blocking/rate limiting medicines here or on home meds  2. Acute PE segmental/subsegmental RUL (lower lobes arteries were not well visualized)     Unprovoked?     Right heart strain was appreciated on CT     RVSP 61mHg (no known pulm hx prior to now)     On heparin gtt  3. HTN     No changes to meds   Pulmonary embolism may be contributing to heart block.  Appears acute by CT reading, symptoms have been ongoing for at least 2 weeks even more. I Matheu Ploeger review the case with Dr. CCurt Bears he Henslee Lottman see her later today     For questions or updates, please contact CNorwalkPlease consult [email or web address] for contact info under Cardiology/STEMI.   Signed, RBaldwin Jamaica PA-C  06/27/2017 11:20 AM  I have seen and examined this patient with RTommye Standard  Agree with above, note added to reflect my findings.  On exam, RRR, no murmurs, lungs clear.  She presented to the hospital after being found with an elevated d-dimer.  He been having exertional shortness of breath over the prior 2 weeks.  CT scan showed a pulmonary embolism and evidence of right ventricular strain.  Transthoracic echo with a PA pressure of 77 mmHg.  She was noted to be bradycardic.  Review of telemetry shows both Mobitz 1 and 2-1 AV block.  It is likely that the 2-1 AV block is in the AV node due to her Mobitz 1 AV block.  With her history of a recent PE, would prefer to hold off on pacemaker implantation if possible.  She is also currently on heparin, and would also not want to place a pacemaker with heparin.  I have made her an appointment for pacemaker implant next week.  Would be able to do this on anticoagulation.  Donette Mainwaring M. Aviyanna Colbaugh MD 06/27/2017 3:38 PM

## 2017-06-28 LAB — HEPARIN LEVEL (UNFRACTIONATED): HEPARIN UNFRACTIONATED: 0.26 [IU]/mL — AB (ref 0.30–0.70)

## 2017-06-28 LAB — CBC
HCT: 36.1 % (ref 36.0–46.0)
HEMOGLOBIN: 11.4 g/dL — AB (ref 12.0–15.0)
MCH: 26.8 pg (ref 26.0–34.0)
MCHC: 31.6 g/dL (ref 30.0–36.0)
MCV: 84.7 fL (ref 78.0–100.0)
Platelets: 233 10*3/uL (ref 150–400)
RBC: 4.26 MIL/uL (ref 3.87–5.11)
RDW: 14.7 % (ref 11.5–15.5)
WBC: 8 10*3/uL (ref 4.0–10.5)

## 2017-06-28 MED ORDER — APIXABAN 5 MG PO TABS: 5.0000 mg | ORAL_TABLET | Freq: Two times a day (BID) | ORAL | Status: DC

## 2017-06-28 MED ORDER — APIXABAN 5 MG PO TABS
10.0000 mg | ORAL_TABLET | Freq: Two times a day (BID) | ORAL | Status: DC
  Administered 2017-06-28: 10 mg via ORAL
  Filled 2017-06-28: qty 2

## 2017-06-28 MED ORDER — APIXABAN 5 MG PO TABS: ORAL_TABLET | ORAL | 0 refills | Status: DC

## 2017-06-28 NOTE — Care Management Important Message (Signed)
Important Message  Patient Details  Name: Lindsey Ross MRN: [id number] Date of Birth: 11/23/1924   Medicare Important Message Given:  Yes    Jarren Para Abena 06/28/2017, 10:18 AM

## 2017-06-28 NOTE — Progress Notes (Signed)
ANTICOAGULATION CONSULT NOTE - Follow Up Consult  Pharmacy Consult for heparin Indication: PE/DVT  Labs: Recent Labs    06/25/17 1120  06/26/17 0148 06/27/17 0229 06/28/17 0220  HGB  --    < > 11.1* 11.1* 11.4*  HCT  --   --  35.3* 35.5* 36.1  PLT  --   --  228 249 233  HEPARINUNFRC 0.63  --  0.55 0.42 0.26*  TROPONINI <0.03  --   --   --   --    < > = values in this interval not displayed.    Assessment: 83yo female now subtherapeutic on heparin after several levels at goal though had been trending down.  Goal of Therapy:  Heparin level 0.3-0.7 units/ml   Plan:  Will increase heparin gtt slightly to 1150 units/hr and check level in 8hr.  Lindsey Ross, PharmD, BCPS  06/28/2017,3:29 AM

## 2017-06-28 NOTE — Progress Notes (Signed)
Per insurance check for Eliquis  # 2 JESSICA @ [employer] [facility] # [phone] OPT- 2   ELIQUIS 5 MG BID   COVER- YES  CO-PAY- $ 105.62 ( Refill Waiting an to soon )  TIER- 2 DRUG  PRIOR APPROVAL- NO  DEDUCTIBLE: NO  NEXT MONTH REFILL CO-PAY- $ 85.68    PREFERRED PHARMACY : [facility], [facility] AND [facility].   AFTER 08/22/17 [facility] NO LONGER A PREFERRED

## 2017-06-28 NOTE — Care Management Note (Addendum)
Case Management Note Donn PieriniKristi Lonna Rabold RN, BSN Unit 4E-Case Manager [phone]  Patient Details  Name: Lindsey Ross MRN: [id number] Date of Birth: 09/27/1924  Subjective/Objective:    Pt admitted with PE/DVT               Action/Plan: PTA pt lived at home with spouse- plan to return home- referral for eliquis needs - benefits check submitted- pt for d/c home today- spoke with pt and daughter at bedside regarding eliquis- script has been sent to pharmacy- 30 day free card provided to pt to use on discharge- pt has [employer] prescription coverage- CM will let pt know copay cost if pt still here when info returned- otherwise- informed pt her pharmacy can also provide her that information when she goes there to pick up script. Pt will f/u with PCP.  Update-1215- benefits check for eliquis showed copay cost of $85.68- spoke with daughter and pt at bedside to inform them of cost- number for [facility] provided to daughter to f/u on cost verification and possible mail order.   Expected Discharge Date:  06/28/17               Expected Discharge Plan:  Home/Self Care  In-House Referral:  NA  Discharge planning Services  CM Consult, Medication Assistance  Post Acute Care Choice:  NA Choice offered to:  NA  DME Arranged:  N/A DME Agency:  NA  HH Arranged:  NA HH Agency:     Status of Service:  Completed, signed off  If discussed at Long Length of Stay Meetings, dates discussed:    Discharge Disposition: home/self care   Additional Comments:  Darrold SpanWebster, [place]ai Macbride Hall, RN 06/28/2017, 10:28 AM

## 2017-06-28 NOTE — Discharge Summary (Signed)
Physician Discharge Summary  Lindsey Ross ZHG:[id number] DOB: [birth year] DOA: 06/24/2017  PCP: [name], MD  Admit date: 06/24/2017 Discharge date: 06/28/2017  Recommendations for Outpatient Follow-up:  1. F/u acute PE and DVT 2. F/u Mobitz II  Follow-up Information    Constance Haw, MD Follow up.   Specialty:  Cardiology Why:  keep appointment already arranged per Dr. Curt Bears. Contact information: [address] [place] [address] [phone]        [name], MD. Schedule an appointment as soon as possible for a visit in 3 week(s).   Specialty:  Internal Medicine Contact information: [address] [place] [address] [phone]            Discharge Diagnoses:  1. Acute segmental and subsegmental pulmonary emboli within the 2. right upper lobe  3. Mobitz I/II 4. LLE acute DVT posterior tibial veins 5. Essential HTN 6. Aortic atherosclerosis  Discharge Condition: improved Disposition: home  Diet recommendation: regular   Filed Weights   06/24/17 1425  Weight: 78.9 kg (174 lb)    History of present illness:  89yow seen by cardiology outpatient 11/1 for SOB. D-dimer was positive and pt sent for CTA which showed PE with potential RV strain >> sent to ED. Found to have bradycardia with Mobitz II  Hospital Course:  Patient started on heparin gtt and tolerated well. Seen by pulm who recommended against intervention. Echo was reassuring showing normal LV and RV function. Cardiology recommended EP eval for asymptomatic Mobitz II bradycardia, the timing of EP eval delayed discharge. EP recommended outpatient PM placement next week and no further inpatient treatment. Patient remains asymptomatic. Discussed anticoagulants with patient and daughter (a Software engineer), risk and benefits. Patient elects Eliquis. Hospitalization was uncomplicated.  Acute segmental and subsegmental pulmonary emboli within the right upper lobe with  CT evidence of RV strain and CT findings to suggest intermediate risk PE. Echo reassuring with normal LV and RV systolic function.PCCM rec no EKOS. - stable. Change to Eliquis.   Mobitz I/II. TSH WNL - asymptomatic - EP recommended outpatient f/u  LLE acute DVT posterior tibial veins - Wliquis  Essential HTN -stable. Continue HCTZ and ARB.  Aortic atherosclerosis -asymptomatic. Consider statin as outpatient if benefit > risk.  Consultants:  Cardiology  PCCM  Procedures:  Echo Study Conclusions  - Left ventricle: The cavity size was normal. There was moderate concentric hypertrophy. Systolic function was normal. The estimated ejection fraction was in the range of 60% to 65%. Wall motion was normal; there were no regional wall motion abnormalities. Doppler parameters are consistent with abnormal left ventricular relaxation (grade 1 diastolic dysfunction). The E/e&' ratio is >15, suggesting elevated LV filling pressure. - Aortic valve: Mildly calcified leaflets. There was no stenosis. There was no significant regurgitation. - Mitral valve: Heavy MAC. Mild regurgitation. Valve area by continuity equation (using LVOT flow): 2.08 cm^2. - Left atrium: Severely dilated. - Right ventricle: The cavity size was mildly dilated. Systolic function was normal. - Right atrium: The atrium was mildly dilated. - Atrial septum: No defect or patent foramen ovale was identified. - Tricuspid valve: There was moderate regurgitation. - Pulmonary arteries: PA peak pressure: 77 mm Hg (S). - Inferior vena cava: The vessel was dilated. The respirophasic diameter changes were blunted (<50%), consistent with elevated central venous pressure.  Impressions:  - LVEF 60-65%, moderate LVH, normal wall motion, grade 1 DD, elevated LV filling pressure, mild aortic valve calcification, heavy MAC with mild MR, severe LAE,  mild RAE, mild RVE with normal RV function,  moderate TR, RVSP 77 mmHg, dilated IVC, no pericardial effusion.  Today's assessment: S: feels fine. No CP, SOB or dizziness. O: Vitals:  Vitals:   06/28/17 0024 06/28/17 0920  BP: (!) 156/65 (!) 154/57  Pulse: (!) 38 (!) 42  Resp: 17 20  Temp:  98.3 F (36.8 C)  SpO2: 93% 96%    Constitutional:  . Appears calm and comfortable Respiratory:  . CTA bilaterally, no w/r/r.  . Respiratory effort normal.  Cardiovascular:  . bradycardic, no m/r/g . No LE extremity edema   Psychiatric:  . Mental status o Mood, affect appropriate   Discharge Instructions  Discharge Instructions    Diet general   Complete by:  As directed    Discharge instructions   Complete by:  As directed    Call your physician or seek immediate medical attention for chest pain, shortness of breath, dizziness, bleeding, passing out or worsening of condition.   Increase activity slowly   Complete by:  As directed      Allergies as of 06/28/2017   No Known Allergies     Medication List    STOP taking these medications   aspirin EC 81 MG tablet     TAKE these medications   acetaminophen 500 MG tablet Commonly known as:  TYLENOL Take 1,000 mg by mouth every 6 (six) hours as needed for headache (pain).   albuterol 108 (90 Base) MCG/ACT inhaler Commonly known as:  PROVENTIL HFA;VENTOLIN HFA Inhale 1-2 puffs into the lungs every 6 (six) hours as needed for wheezing or shortness of breath.   apixaban 5 MG Tabs tablet Commonly known as:  ELIQUIS Take 2 tablets (10 mg total) 2 (two) times daily for 7 days by mouth, THEN 1 tablet (5 mg total) 2 (two) times daily for 23 days. Start taking on:  06/28/2017   CALCIUM + D3 PO Take 1 tablet by mouth daily.   gabapentin 100 MG capsule Commonly known as:  NEURONTIN Take 200 mg by mouth 3 (three) times daily.   multivitamin with minerals tablet Take 1 tablet by mouth daily.   telmisartan-hydrochlorothiazide 80-25 MG tablet Commonly known as:   MICARDIS HCT Take 1 tablet by mouth daily.      No Known Allergies  The results of significant diagnostics from this hospitalization (including imaging, microbiology, ancillary and laboratory) are listed below for reference.    Significant Diagnostic Studies: Ct Angio Chest Pe W Or Wo Contrast  Addendum Date: 06/24/2017   ADDENDUM REPORT: 06/24/2017 14:13 ADDENDUM: Critical Value/emergent results were called by telephone on 06/24/2017 at 2:12 pm to Dr. Jyl Heinz , who verbally acknowledged these results. Electronically Signed   By: Titus Dubin M.D.   On: 06/24/2017 14:13   Result Date: 06/24/2017 CLINICAL DATA:  Shortness of breath with exertion. Elevated D-dimer. Evaluate for pulmonary embolism. EXAM: CT ANGIOGRAPHY CHEST WITH CONTRAST TECHNIQUE: Multidetector CT imaging of the chest was performed using the standard protocol during bolus administration of intravenous contrast. Multiplanar CT image reconstructions and MIPs were obtained to evaluate the vascular anatomy. CONTRAST:  100 cc Isovue 370 intravenous contrast. COMPARISON:  MRI thoracic spine dated December 05, 2016. FINDINGS: Cardiovascular: Acute segmental and subsegmental pulmonary emboli within the right upper lobe. The lower lobe pulmonary arteries are not well evaluated due to extensive motion artifact. Evidence of right heart strain with RV/LV ratio measuring 0.92, and reflux of contrast into the hepatic veins. Mild cardiomegaly. No pericardial effusion.  Normal caliber thoracic aorta. Coronary, aortic arch, and branch vessel atherosclerotic vascular disease. Dense concentric calcification of the mitral valve. Mediastinum/Nodes: No enlarged mediastinal, hilar, or axillary lymph nodes. Thyroid gland, trachea, and esophagus demonstrate no significant findings. Lungs/Pleura: Patchy nodularity in the peripheral right upper lobe. Trace left pleural effusion. Bibasilar subsegmental atelectasis. No pneumothorax or consolidation. No  suspicious pulmonary nodule. Upper Abdomen: No acute abnormality. Large hiatal hernia. Stable 1.6 x 2.6 cm cystic lesion in the pancreatic body. Musculoskeletal: No chest wall abnormality. No acute or significant osseous findings. Multiple chronic compression deformities of the thoracic spine, unchanged when compared to prior thoracic spine MRI. Review of the MIP images confirms the above findings. IMPRESSION: 1. Acute segmental and subsegmental pulmonary emboli within the right upper lobe. The lower lobe pulmonary arteries are not well evaluated due to extensive motion artifact. There is CT evidence of right heart strain with RV/LV Ratio of 0.92 and reflux of contrast into the hepatic veins, consistent with at least submassive (intermediate risk) PE. The presence of right heart strain has been associated with an increased risk of morbidity and mortality. Please activate Code PE by paging [phone]. 2. Patchy nodularity in the peripheral right upper lobe may be infectious or inflammatory. Alternatively, this could represent early pulmonary infarct. 3.  Aortic atherosclerosis (ICD10-I70.0). Radiology assistant personnel have been notified to put me in telephone contact with the referring physician or the referring physician's clinical representative in order to discuss these findings. Once this communication is established I will issue an addendum to this report for documentation purposes. Electronically Signed: By: Titus Dubin M.D. On: 06/24/2017 13:53    Labs: Basic Metabolic Panel: Recent Labs  Lab 06/24/17 1440 06/25/17 0319  NA 137 140  K 4.6 3.7  CL 103 106  CO2 27 25  GLUCOSE 119* 104*  BUN 30* 20  CREATININE 0.83 0.74  CALCIUM 9.9 9.1   Liver Function Tests: Recent Labs  Lab 06/24/17 1440  AST 22  ALT 12*  ALKPHOS 47  BILITOT 0.7  PROT 7.9  ALBUMIN 4.2   CBC: Recent Labs  Lab 06/24/17 1440 06/25/17 0319 06/26/17 0148 06/27/17 0229 06/28/17 0220  WBC 6.7 6.3 6.2 7.2  8.0  NEUTROABS 3.8  --   --   --   --   HGB 11.9* 11.0* 11.1* 11.1* 11.4*  HCT 37.1 35.7* 35.3* 35.5* 36.1  MCV 83.9 84.6 84.7 84.5 84.7  PLT 238 222 228 249 233   Cardiac Enzymes: Recent Labs  Lab 06/24/17 1440 06/24/17 2144 06/25/17 0319 06/25/17 1120  TROPONINI <0.03 <0.03 <0.03 <0.03    Recent Labs    11/27/16 0420 06/24/17 2146  BNP 55.5 342.1*    Principal Problem:   Pulmonary embolism (HCC) Active Problems:   Essential hypertension   Heart block AV second degree   Aortic atherosclerosis (HCC)   Acute deep vein thrombosis (DVT) of left tibial vein ([place])   Time coordinating discharge: 35 minutes  Signed:  [name], MD [facility] 06/28/2017, 9:50 AM

## 2017-06-28 NOTE — Progress Notes (Signed)
Pacemaker scheduled for 07/07/17.  Discussed with patient and daughter at bedside.  Instructions on NPO after midnight, time to arrive and medication instructions were given to the patient and placed I AVS as well.  She remains without brady symptoms here.  Discussed to limit activity and and if any symptoms occur or of any concern to return.   She states understanding and is comfortable with the plan  Francis Dowseenee Ursuy, PA-C

## 2017-06-28 NOTE — Progress Notes (Signed)
ANTICOAGULATION CONSULT NOTE - Follow Up Consult  Pharmacy Consult for apixaban Indication: pulmonary embolus and DVT  No Known Allergies  Patient Measurements: Height: 5\' 4"  (162.6 cm) Weight: 174 lb (78.9 kg) IBW/kg (Calculated) : 54.7  Vital Signs: Temp: 98.3 F (36.8 C) (11/06 0920) Temp Source: Oral (11/06 0920) BP: 154/57 (11/06 0920) Pulse Rate: 42 (11/06 0920)  Labs: Recent Labs    06/26/17 0148 06/27/17 0229 06/28/17 0220  HGB 11.1* 11.1* 11.4*  HCT 35.3* 35.5* 36.1  PLT 228 249 233  HEPARINUNFRC 0.55 0.42 0.26*    Estimated Creatinine Clearance: 48.5 mL/min (by C-G formula based on SCr of 0.74 mg/dL).  Assessment: [age] yof with new PE and DVT to transition from IV heparin to oral apixaban today. Hgb is stable and platelets are WNL. No bleeding noted.   Goal of Therapy:  Treatment of PE/DVT Monitor platelets by anticoagulation protocol: Yes   Plan:  Apixaban 10mg  PO BID x 7 days then 5mg  po BID thereafter F/u S&S of bleeding, renal function  Ina Scrivens, Drake Leachachel Lynn 06/28/2017,10:54 AM

## 2017-06-29 ENCOUNTER — Ambulatory Visit (HOSPITAL_BASED_OUTPATIENT_CLINIC_OR_DEPARTMENT_OTHER): Payer: Medicare Other

## 2017-06-30 ENCOUNTER — Ambulatory Visit: Payer: Medicare Other | Admitting: Cardiology

## 2017-07-05 DIAGNOSIS — D649 Anemia, unspecified: Secondary | ICD-10-CM | POA: Diagnosis not present

## 2017-07-05 DIAGNOSIS — I10 Essential (primary) hypertension: Secondary | ICD-10-CM | POA: Diagnosis not present

## 2017-07-05 DIAGNOSIS — I82402 Acute embolism and thrombosis of unspecified deep veins of left lower extremity: Secondary | ICD-10-CM | POA: Diagnosis not present

## 2017-07-05 DIAGNOSIS — I441 Atrioventricular block, second degree: Secondary | ICD-10-CM | POA: Diagnosis not present

## 2017-07-05 DIAGNOSIS — Z7901 Long term (current) use of anticoagulants: Secondary | ICD-10-CM | POA: Insufficient documentation

## 2017-07-05 DIAGNOSIS — I7090 Unspecified atherosclerosis: Secondary | ICD-10-CM | POA: Diagnosis not present

## 2017-07-05 DIAGNOSIS — Z6832 Body mass index (BMI) 32.0-32.9, adult: Secondary | ICD-10-CM | POA: Diagnosis not present

## 2017-07-05 DIAGNOSIS — E1151 Type 2 diabetes mellitus with diabetic peripheral angiopathy without gangrene: Secondary | ICD-10-CM | POA: Diagnosis not present

## 2017-07-05 DIAGNOSIS — N183 Chronic kidney disease, stage 3 (moderate): Secondary | ICD-10-CM | POA: Diagnosis not present

## 2017-07-05 DIAGNOSIS — E7849 Other hyperlipidemia: Secondary | ICD-10-CM | POA: Diagnosis not present

## 2017-07-05 DIAGNOSIS — I2699 Other pulmonary embolism without acute cor pulmonale: Secondary | ICD-10-CM | POA: Diagnosis not present

## 2017-07-06 ENCOUNTER — Telehealth: Payer: Self-pay | Admitting: Cardiology

## 2017-07-06 NOTE — Telephone Encounter (Signed)
Patient's daughters is calling regarding her medications for tomorrows pacemaker that is scheduled..please call her

## 2017-07-06 NOTE — Telephone Encounter (Signed)
Informed patient that the only medication that she would need to take the morning of her procedure is her micardis so that her BP would be under control. Educated the patient as to what to expect for her PPM procedure. Enforced her to be NPO after midnight tonight. Patient was understanding to hold her eliquis tomorrow for her procedure.

## 2017-07-07 ENCOUNTER — Encounter (HOSPITAL_COMMUNITY): Payer: Self-pay | Admitting: General Practice

## 2017-07-07 ENCOUNTER — Encounter (HOSPITAL_COMMUNITY)
Admission: RE | Disposition: A | Discharge status: Home / Self Care | Payer: Self-pay | Source: Ambulatory Visit | Attending: Cardiology

## 2017-07-07 ENCOUNTER — Ambulatory Visit (HOSPITAL_COMMUNITY)
Admission: RE | Admit: 2017-07-07 | Discharge: 2017-07-08 | Disposition: A | Discharge status: Home / Self Care | Payer: Medicare Other | Source: Ambulatory Visit | Attending: Cardiology | Admitting: Cardiology

## 2017-07-07 ENCOUNTER — Other Ambulatory Visit: Payer: Self-pay

## 2017-07-07 DIAGNOSIS — I440 Atrioventricular block, first degree: Secondary | ICD-10-CM | POA: Diagnosis not present

## 2017-07-07 DIAGNOSIS — I441 Atrioventricular block, second degree: Secondary | ICD-10-CM | POA: Diagnosis present

## 2017-07-07 DIAGNOSIS — Z86711 Personal history of pulmonary embolism: Secondary | ICD-10-CM | POA: Diagnosis not present

## 2017-07-07 DIAGNOSIS — I10 Essential (primary) hypertension: Secondary | ICD-10-CM | POA: Diagnosis not present

## 2017-07-07 DIAGNOSIS — Z95818 Presence of other cardiac implants and grafts: Secondary | ICD-10-CM

## 2017-07-07 HISTORY — DX: Presence of cardiac pacemaker: Z95.0

## 2017-07-07 HISTORY — PX: PACEMAKER IMPLANT: EP1218

## 2017-07-07 HISTORY — DX: Acute embolism and thrombosis of unspecified deep veins of unspecified lower extremity: I82.409

## 2017-07-07 HISTORY — PX: INSERT / REPLACE / REMOVE PACEMAKER: SUR710

## 2017-07-07 LAB — SURGICAL PCR SCREEN
MRSA, PCR: NEGATIVE
STAPHYLOCOCCUS AUREUS: NEGATIVE

## 2017-07-07 SURGERY — PACEMAKER IMPLANT

## 2017-07-07 MED ORDER — CEFAZOLIN SODIUM-DEXTROSE 2-4 GM/100ML-% IV SOLN
2.0000 g | INTRAVENOUS | Status: AC
  Administered 2017-07-07: 2 g via INTRAVENOUS
  Filled 2017-07-07: qty 100

## 2017-07-07 MED ORDER — IOPAMIDOL (ISOVUE-370) INJECTION 76%
INTRAVENOUS | Status: AC
  Filled 2017-07-07: qty 50

## 2017-07-07 MED ORDER — SODIUM CHLORIDE 0.9 % IV SOLN
INTRAVENOUS | Status: DC
  Administered 2017-07-07: 13:00:00 via INTRAVENOUS

## 2017-07-07 MED ORDER — ONDANSETRON HCL 4 MG/2ML IJ SOLN: 4.0000 mg | Freq: Four times a day (QID) | INTRAMUSCULAR | Status: DC | PRN

## 2017-07-07 MED ORDER — CEFAZOLIN SODIUM-DEXTROSE 2-4 GM/100ML-% IV SOLN
INTRAVENOUS | Status: AC
  Filled 2017-07-07: qty 100

## 2017-07-07 MED ORDER — IRBESARTAN 150 MG PO TABS
300.0000 mg | ORAL_TABLET | Freq: Every day | ORAL | Status: DC
  Administered 2017-07-08: 300 mg via ORAL
  Filled 2017-07-07 (×3): qty 2

## 2017-07-07 MED ORDER — HEPARIN (PORCINE) IN NACL 2-0.9 UNIT/ML-% IJ SOLN
INTRAMUSCULAR | Status: AC
  Filled 2017-07-07: qty 500

## 2017-07-07 MED ORDER — MUPIROCIN 2 % EX OINT: 1.0000 "application " | TOPICAL_OINTMENT | Freq: Once | CUTANEOUS | Status: DC

## 2017-07-07 MED ORDER — ADULT MULTIVITAMIN W/MINERALS CH
1.0000 | ORAL_TABLET | Freq: Every day | ORAL | Status: DC
  Administered 2017-07-07 – 2017-07-08 (×2): 1 via ORAL
  Filled 2017-07-07 (×4): qty 1

## 2017-07-07 MED ORDER — CHLORHEXIDINE GLUCONATE 4 % EX LIQD
60.0000 mL | Freq: Once | CUTANEOUS | Status: DC
  Filled 2017-07-07: qty 60

## 2017-07-07 MED ORDER — CEFAZOLIN SODIUM-DEXTROSE 1-4 GM/50ML-% IV SOLN
1.0000 g | Freq: Four times a day (QID) | INTRAVENOUS | Status: AC
  Administered 2017-07-07 – 2017-07-08 (×3): 1 g via INTRAVENOUS
  Filled 2017-07-07 (×3): qty 50

## 2017-07-07 MED ORDER — HYDROCHLOROTHIAZIDE 25 MG PO TABS
25.0000 mg | ORAL_TABLET | Freq: Every day | ORAL | Status: DC
  Administered 2017-07-08: 25 mg via ORAL
  Filled 2017-07-07: qty 1

## 2017-07-07 MED ORDER — LIDOCAINE HCL (PF) 1 % IJ SOLN
INTRAMUSCULAR | Status: AC
  Filled 2017-07-07: qty 60

## 2017-07-07 MED ORDER — IOPAMIDOL (ISOVUE-370) INJECTION 76%
INTRAVENOUS | Status: DC | PRN
  Administered 2017-07-07: 10 mL via INTRAVENOUS

## 2017-07-07 MED ORDER — MIDAZOLAM HCL 5 MG/5ML IJ SOLN
INTRAMUSCULAR | Status: AC
  Filled 2017-07-07: qty 5

## 2017-07-07 MED ORDER — HEPARIN (PORCINE) IN NACL 2-0.9 UNIT/ML-% IJ SOLN
INTRAMUSCULAR | Status: AC | PRN
  Administered 2017-07-07: 500 mL

## 2017-07-07 MED ORDER — LIDOCAINE HCL (PF) 1 % IJ SOLN
INTRAMUSCULAR | Status: DC | PRN
  Administered 2017-07-07: 45 mL

## 2017-07-07 MED ORDER — FENTANYL CITRATE (PF) 100 MCG/2ML IJ SOLN
INTRAMUSCULAR | Status: AC
  Filled 2017-07-07: qty 2

## 2017-07-07 MED ORDER — APIXABAN 5 MG PO TABS
5.0000 mg | ORAL_TABLET | Freq: Two times a day (BID) | ORAL | Status: DC
  Administered 2017-07-07 – 2017-07-08 (×2): 5 mg via ORAL
  Filled 2017-07-07 (×2): qty 1

## 2017-07-07 MED ORDER — ACETAMINOPHEN 500 MG PO TABS
1000.0000 mg | ORAL_TABLET | Freq: Four times a day (QID) | ORAL | Status: DC | PRN
  Administered 2017-07-07: 1000 mg via ORAL
  Filled 2017-07-07: qty 2

## 2017-07-07 MED ORDER — SODIUM CHLORIDE 0.9 % IR SOLN
80.0000 mg | Status: AC
  Administered 2017-07-07: 80 mg
  Filled 2017-07-07: qty 2

## 2017-07-07 MED ORDER — GABAPENTIN 100 MG PO CAPS
200.0000 mg | ORAL_CAPSULE | Freq: Three times a day (TID) | ORAL | Status: DC
  Administered 2017-07-07 – 2017-07-08 (×3): 200 mg via ORAL
  Filled 2017-07-07 (×4): qty 2

## 2017-07-07 MED ORDER — ALBUTEROL SULFATE (2.5 MG/3ML) 0.083% IN NEBU: 3.0000 mL | INHALATION_SOLUTION | Freq: Four times a day (QID) | RESPIRATORY_TRACT | Status: DC | PRN

## 2017-07-07 MED ORDER — CALCIUM-VITAMIN D 500-200 MG-UNIT PO TABS
ORAL_TABLET | Freq: Every day | ORAL | Status: DC
  Filled 2017-07-07 (×3): qty 1

## 2017-07-07 MED ORDER — TELMISARTAN-HCTZ 80-25 MG PO TABS: 1.0000 | ORAL_TABLET | Freq: Every day | ORAL | Status: DC

## 2017-07-07 MED ORDER — MUPIROCIN 2 % EX OINT
TOPICAL_OINTMENT | CUTANEOUS | Status: AC
  Administered 2017-07-07: 1
  Filled 2017-07-07: qty 22

## 2017-07-07 MED ORDER — SODIUM CHLORIDE 0.9 % IR SOLN
Status: AC
  Filled 2017-07-07: qty 2

## 2017-07-07 SURGICAL SUPPLY — 9 items
CABLE SURGICAL S-101-97-12 (CABLE) ×3 IMPLANT
GUIDEWIRE ANGLED .035X150CM (WIRE) ×3 IMPLANT
IPG PACE AZUR XT DR MRI W1DR01 (Pacemaker) ×1 IMPLANT
LEAD CAPSURE NOVUS 5076-52CM (Lead) ×3 IMPLANT
LEAD CAPSURE NOVUS 5076-58CM (Lead) ×3 IMPLANT
PACE AZURE XT DR MRI W1DR01 (Pacemaker) ×3 IMPLANT
PAD DEFIB LIFELINK (PAD) ×3 IMPLANT
SHEATH CLASSIC 7F (SHEATH) ×6 IMPLANT
TRAY PACEMAKER INSERTION (PACKS) ×3 IMPLANT

## 2017-07-07 NOTE — H&P (Signed)
Lindsey Ross is a 83 y.o. female with a history of 2:1 AV block and pulmonary embolism. She was noted to be in heart block at the time of her PE diagnosis. She thus presents for pacemaker implant. On exam, bradycardic, no murmurs, lungs clear. Risks and benefits discussed. Risks include but not limited to bleeding, infection, tamponade, pneumothorax. She understands the risks and has agreed to the procedure.  Will [name], MD 07/07/2017 1:18 PM

## 2017-07-07 NOTE — Discharge Instructions (Signed)
° ° °  Supplemental Discharge Instructions for  °Pacemaker/Defibrillator Patients ° °Activity °No heavy lifting or vigorous activity with your left/right arm for 6 to 8 weeks.  Do not raise your left/right arm above your head for one week.  Gradually raise your affected arm as drawn below. ° °        °   07/11/17                   07/12/17                  07/13/17                 07/14/17 °__ ° °NO DRIVING for  1 week   ; you may begin driving on   07/14/17  . ° °WOUND CARE °- Keep the wound area clean and dry.  Do not get this area wet for one week. No showers for one week; you may shower on  07/14/17 . °- The tape/steri-strips on your wound will fall off; do not pull them off.  No bandage is needed on the site.  DO  NOT apply any creams, oils, or ointments to the wound area. °- If you notice any drainage or discharge from the wound, any swelling or bruising at the site, or you develop a fever > 101? F after you are discharged home, call the office at once. ° °Special Instructions °- You are still able to use cellular telephones; use the ear opposite the side where you have your pacemaker/defibrillator.  Avoid carrying your cellular phone near your device. °- When traveling through airports, show security personnel your identification card to avoid being screened in the metal detectors.  Ask the security personnel to use the hand wand. °- Avoid arc welding equipment, MRI testing (magnetic resonance imaging), TENS units (transcutaneous nerve stimulators).  Call the office for questions about other devices. °- Avoid electrical appliances that are in poor condition or are not properly grounded. °- Microwave ovens are safe to be near or to operate. ° °Additional information for defibrillator patients should your device go off: °- If your device goes off ONCE and you feel fine afterward, notify the device clinic nurses. °- If your device goes off ONCE and you do not feel well afterward, call 911. °- If your device goes  off TWICE, call 911. °- If your device goes off THREE times in one day, call 911. ° °DO NOT DRIVE YOURSELF OR A FAMILY MEMBER °WITH A DEFIBRILLATOR TO THE HOSPITAL--CALL 911. ° °

## 2017-07-08 ENCOUNTER — Ambulatory Visit (HOSPITAL_COMMUNITY): Payer: Medicare Other

## 2017-07-08 ENCOUNTER — Encounter (HOSPITAL_COMMUNITY): Payer: Self-pay | Admitting: Cardiology

## 2017-07-08 DIAGNOSIS — Z950 Presence of cardiac pacemaker: Secondary | ICD-10-CM | POA: Diagnosis not present

## 2017-07-08 DIAGNOSIS — I440 Atrioventricular block, first degree: Secondary | ICD-10-CM | POA: Diagnosis not present

## 2017-07-08 DIAGNOSIS — I441 Atrioventricular block, second degree: Secondary | ICD-10-CM | POA: Diagnosis not present

## 2017-07-08 DIAGNOSIS — Z86711 Personal history of pulmonary embolism: Secondary | ICD-10-CM | POA: Diagnosis not present

## 2017-07-08 DIAGNOSIS — I10 Essential (primary) hypertension: Secondary | ICD-10-CM | POA: Diagnosis not present

## 2017-07-08 MED ORDER — CALCIUM CARBONATE-VITAMIN D 500-200 MG-UNIT PO TABS
1.0000 | ORAL_TABLET | Freq: Every day | ORAL | Status: DC
  Administered 2017-07-08: 1 via ORAL
  Filled 2017-07-08: qty 1

## 2017-07-08 NOTE — Discharge Summary (Signed)
ELECTROPHYSIOLOGY PROCEDURE DISCHARGE SUMMARY    Patient ID: Lindsey Ross,  MRN: [id number], DOB/AGE: 81/12/1924 81 y.o.  Admit date: 07/07/2017 Discharge date: 07/08/2017  Primary Care Physician: Chilton GreathouseAvva, Ravisankar, MD  Primary Cardiologist: Dr. Tomie Chinaevankar Electrophysiologist: Dr. [name]  Primary Discharge Diagnosis:  1. Variable heart block 2. Conduction system disease 3. Symptomatic bradycardia  Secondary Discharge Diagnosis:  1. HTN 2. PE  No Known Allergies   Procedures This Admission:  1.  Implantation of a MDT dual chamber PPM on 07/07/17 by Dr. [name] .  The patient received Medtronic model 5076 (serial number [id number]) right atrial lead and a Medtronic model 5076 (serial number [id number]) right ventricular lead, Medtronic Azure XT DR MRI SureScan (serial number [id number]) pacemaker There were no immediate post procedure complications. 2.  CXR on 07/08/17 demonstrated no pneumothorax status post device implantation.   Brief HPI: Lindsey Ross is a 83 y.o. female was seen recently in consultation recently for symptomatic bradycardia, variable heart block and baseline conduction system disease, recommended PPM implant, delayed to now given acute PE at the time.   Past medical history includes HTN only otherwise.  The patient has had symptomatic bradycardia without reversible causes identified.  Risks, benefits, and alternatives to PPM implantation were reviewed with the patient who wished to proceed.   Hospital Course:  The patient was admitted and underwent implantation of a PPM with details as outlined above.  She was monitored on telemetry overnight which demonstrated SR, V paced rhythm.  Left chest was without hematoma or ecchymosis.  The device was interrogated and found to be functioning normally.  CXR was obtained and demonstrated no pneumothorax status post device implantation, lung exam is unremarkable, patient is encouraged for OOB, deep breathing.   Wound care, arm mobility, and restrictions were reviewed with the patient.  The patient was examined by Dr. [name]  and considered stable for discharge to home.    Physical Exam: Vitals:   07/07/17 1800 07/07/17 1930 07/07/17 2229 07/08/17 0509  BP: (!) 130/111 (!) 166/68 111/68 (!) 148/82  Pulse: 70 74 75 71  Resp:      Temp:   98.4 F (36.9 C) 97.7 F (36.5 C)  TempSrc:   Oral Oral  SpO2: 100% 95% 96% 97%  Weight:    179 lb 11.2 oz (81.5 kg)  Height:        GEN- The patient is well appearing, alert and oriented x 3 today.   HEENT: normocephalic, atraumatic; sclera clear, conjunctiva pink; hearing intact; oropharynx clear; neck supple, no JVP Lungs- CTA b/l, normal work of breathing.  No wheezes, rales, rhonchi Heart- RRR, no murmurs, rubs or gallops GI- soft, non-tender, non-distended Extremities- no clubbing, cyanosis, or edema MS- no significant deformity or atrophy Skin- warm and dry, no rash or lesion, left chest without hematoma/ecchymosis Psych- euthymic mood, full affect Neuro- no gross deficits   Labs:   Lab Results  Component Value Date   WBC 8.0 06/28/2017   HGB 11.4 (L) 06/28/2017   HCT 36.1 06/28/2017   MCV 84.7 06/28/2017   PLT 233 06/28/2017   No results for input(s): NA, K, CL, CO2, BUN, CREATININE, CALCIUM, PROT, BILITOT, ALKPHOS, ALT, AST, GLUCOSE in the last 168 hours.  Invalid input(s): LABALBU  Discharge Medications:  Allergies as of 07/08/2017   No Known Allergies     Medication List    TAKE these medications   acetaminophen 500 MG tablet Commonly known as:  TYLENOL Take  1,000 mg by mouth every 6 (six) hours as needed for headache (pain).   albuterol 108 (90 Base) MCG/ACT inhaler Commonly known as:  PROVENTIL HFA;VENTOLIN HFA Inhale 1-2 puffs into the lungs every 6 (six) hours as needed for wheezing or shortness of breath.   apixaban 5 MG Tabs tablet Commonly known as:  ELIQUIS Take 2 tablets (10 mg total) 2 (two) times daily for  7 days by mouth, THEN 1 tablet (5 mg total) 2 (two) times daily for 23 days. Start taking on:  06/28/2017 Notes to patient:  Continue your current regime/dose uninterupted   CALCIUM + D3 PO Take 1 tablet by mouth daily.   gabapentin 100 MG capsule Commonly known as:  NEURONTIN Take 200 mg by mouth 3 (three) times daily.   multivitamin with minerals tablet Take 1 tablet by mouth daily.   telmisartan-hydrochlorothiazide 80-25 MG tablet Commonly known as:  MICARDIS HCT Take 1 tablet by mouth daily.       Disposition:  Home  Discharge Instructions    Diet - low sodium heart healthy   Complete by:  As directed    Increase activity slowly   Complete by:  As directed      Follow-up Information    [facility] [address] [facility] Follow up on 07/21/2017.   Specialty:  Cardiology Why:  12:00PM (noon), wound check visit Contact information: [address], [address] [place] [id number] [phone]       Regan Lemmingamnitz, Detron Carras Martin, MD Follow up on 10/07/2017.   Specialty:  Cardiology Why:  12:00PM (noon) Contact information: [address] [place] [address] [phone]           Duration of Discharge Encounter: Greater than 30 minutes including physician time.  Norma FredricksonSigned, Renee Ursuy, PA-C 07/08/2017 8:51 AM  I have seen and examined this patient with Francis Dowseenee Ursuy.  Agree with above, note added to reflect my findings.  On exam, RRR, no murmurs, lungs clear.  Had Medtronic dual-chamber pacemaker implanted for 2-1 AV block.  Device function and chest x-ray without major issue.  Plan for discharge today.  Of note, her P waves and R waves are low as they were at device implant.  Geraline Halberstadt M. Araiyah Cumpton MD 07/08/2017 10:06 AM

## 2017-07-08 NOTE — Progress Notes (Signed)
Ambulated the pt with walker.  Denied chest pain or SOB. Steady gait.  Hinton DyerYoko Dylon Correa, RN

## 2017-07-08 NOTE — Progress Notes (Signed)
Discharge instruction was given to pt and family. Belongings were sent home with pt.  Shavar Gorka, RN 

## 2017-07-12 ENCOUNTER — Ambulatory Visit (INDEPENDENT_AMBULATORY_CARE_PROVIDER_SITE_OTHER): Payer: Self-pay | Admitting: *Deleted

## 2017-07-12 DIAGNOSIS — I498 Other specified cardiac arrhythmias: Secondary | ICD-10-CM

## 2017-07-12 NOTE — Progress Notes (Signed)
Medtronic Insurance account managerBluSync technology pairing and education done with Theatre stage managerMedtronic Representative and Research. ROV with Device Clinic 07/21/17 for wound check

## 2017-07-21 ENCOUNTER — Ambulatory Visit (INDEPENDENT_AMBULATORY_CARE_PROVIDER_SITE_OTHER): Payer: Medicare Other | Admitting: *Deleted

## 2017-07-21 DIAGNOSIS — I441 Atrioventricular block, second degree: Secondary | ICD-10-CM | POA: Diagnosis not present

## 2017-07-21 DIAGNOSIS — Z950 Presence of cardiac pacemaker: Secondary | ICD-10-CM

## 2017-07-21 LAB — CUP PACEART INCLINIC DEVICE CHECK
Battery Remaining Longevity: 141 mo
Battery Voltage: 3.21 V
Brady Statistic AS VS Percent: 0.11 %
Brady Statistic RA Percent Paced: 5.75 %
Date Time Interrogation Session: 20181129172652
Implantable Lead Implant Date: 20181115
Implantable Lead Location: 753859
Implantable Lead Model: 5076
Implantable Pulse Generator Implant Date: 20181115
Lead Channel Impedance Value: 304 Ohm
Lead Channel Impedance Value: 456 Ohm
Lead Channel Pacing Threshold Amplitude: 0.5 V
Lead Channel Pacing Threshold Amplitude: 1 V
Lead Channel Pacing Threshold Pulse Width: 0.4 ms
Lead Channel Sensing Intrinsic Amplitude: 1 mV
Lead Channel Setting Pacing Amplitude: 3.5 V
Lead Channel Setting Pacing Pulse Width: 0.4 ms
MDC IDC LEAD IMPLANT DT: 20181115
MDC IDC LEAD LOCATION: 753860
MDC IDC MSMT LEADCHNL RA PACING THRESHOLD PULSEWIDTH: 0.4 ms
MDC IDC MSMT LEADCHNL RV IMPEDANCE VALUE: 513 Ohm
MDC IDC MSMT LEADCHNL RV IMPEDANCE VALUE: 608 Ohm
MDC IDC MSMT LEADCHNL RV SENSING INTR AMPL: 6.75 mV
MDC IDC SET LEADCHNL RA PACING AMPLITUDE: 3.5 V
MDC IDC SET LEADCHNL RV SENSING SENSITIVITY: 0.9 mV
MDC IDC STAT BRADY AP VP PERCENT: 5.82 %
MDC IDC STAT BRADY AP VS PERCENT: 0 %
MDC IDC STAT BRADY AS VP PERCENT: 94.07 %
MDC IDC STAT BRADY RV PERCENT PACED: 99.89 %

## 2017-07-21 NOTE — Progress Notes (Signed)
Wound check appointment. Steri-strips removed. Wound without redness or edema. Incision edges approximated, wound well healed. Normal device function. Thresholds, sensing, and impedances consistent with implant measurements. Device programmed at 3.5V with auto capture programmed on for extra safety margin until 3 month visit. Histogram distribution appropriate for patient and level of activity. Max RA/RV impedances decreased to 2000ohms per protocol. No mode switches or high ventricular rates noted. Patient educated about wound care, arm mobility, lifting restrictions, and BlueSync app. ROV with WC on 10/07/17.

## 2017-07-28 ENCOUNTER — Ambulatory Visit (INDEPENDENT_AMBULATORY_CARE_PROVIDER_SITE_OTHER): Payer: Medicare Other | Admitting: *Deleted

## 2017-07-28 DIAGNOSIS — Z950 Presence of cardiac pacemaker: Secondary | ICD-10-CM

## 2017-07-28 NOTE — Progress Notes (Signed)
Remote pacemaker transmission.   

## 2017-08-02 LAB — CUP PACEART REMOTE DEVICE CHECK
Battery Voltage: 3.2 V
Brady Statistic AP VP Percent: 2.45 %
Brady Statistic AS VS Percent: 0.18 %
Date Time Interrogation Session: 20181206032433
Implantable Lead Implant Date: 20181115
Implantable Lead Location: 753859
Implantable Pulse Generator Implant Date: 20181115
Lead Channel Impedance Value: 532 Ohm
Lead Channel Impedance Value: 646 Ohm
Lead Channel Pacing Threshold Amplitude: 0.875 V
Lead Channel Pacing Threshold Pulse Width: 0.4 ms
Lead Channel Sensing Intrinsic Amplitude: 6.75 mV
Lead Channel Setting Pacing Amplitude: 3.5 V
Lead Channel Setting Pacing Amplitude: 3.5 V
Lead Channel Setting Pacing Pulse Width: 0.4 ms
Lead Channel Setting Sensing Sensitivity: 0.9 mV
MDC IDC LEAD IMPLANT DT: 20181115
MDC IDC LEAD LOCATION: 753860
MDC IDC MSMT BATTERY REMAINING LONGEVITY: 143 mo
MDC IDC MSMT LEADCHNL RA IMPEDANCE VALUE: 304 Ohm
MDC IDC MSMT LEADCHNL RA IMPEDANCE VALUE: 475 Ohm
MDC IDC MSMT LEADCHNL RA PACING THRESHOLD AMPLITUDE: 0.625 V
MDC IDC MSMT LEADCHNL RA PACING THRESHOLD PULSEWIDTH: 0.4 ms
MDC IDC MSMT LEADCHNL RA SENSING INTR AMPL: 1.125 mV
MDC IDC MSMT LEADCHNL RA SENSING INTR AMPL: 1.125 mV
MDC IDC STAT BRADY AP VS PERCENT: 0 %
MDC IDC STAT BRADY AS VP PERCENT: 97.36 %
MDC IDC STAT BRADY RA PERCENT PACED: 2.43 %
MDC IDC STAT BRADY RV PERCENT PACED: 99.82 %

## 2017-08-05 ENCOUNTER — Encounter: Payer: Self-pay | Admitting: Cardiology

## 2017-08-11 ENCOUNTER — Ambulatory Visit (INDEPENDENT_AMBULATORY_CARE_PROVIDER_SITE_OTHER): Payer: Self-pay | Admitting: *Deleted

## 2017-08-11 DIAGNOSIS — Z950 Presence of cardiac pacemaker: Secondary | ICD-10-CM

## 2017-08-11 NOTE — Progress Notes (Signed)
Remote pacemaker transmission.   

## 2017-08-12 ENCOUNTER — Encounter: Payer: Self-pay | Admitting: Cardiology

## 2017-08-25 LAB — CUP PACEART REMOTE DEVICE CHECK
Brady Statistic AP VS Percent: 0 %
Brady Statistic AS VP Percent: 96.79 %
Brady Statistic AS VS Percent: 0.09 %
Implantable Lead Implant Date: 20181115
Implantable Lead Model: 5076
Implantable Lead Model: 5076
Lead Channel Impedance Value: 323 Ohm
Lead Channel Impedance Value: 475 Ohm
Lead Channel Impedance Value: 532 Ohm
Lead Channel Pacing Threshold Pulse Width: 0.4 ms
Lead Channel Sensing Intrinsic Amplitude: 6.75 mV
Lead Channel Setting Pacing Amplitude: 3.5 V
Lead Channel Setting Pacing Pulse Width: 0.4 ms
MDC IDC LEAD IMPLANT DT: 20181115
MDC IDC LEAD LOCATION: 753859
MDC IDC LEAD LOCATION: 753860
MDC IDC MSMT BATTERY REMAINING LONGEVITY: 144 mo
MDC IDC MSMT BATTERY VOLTAGE: 3.2 V
MDC IDC MSMT LEADCHNL RA PACING THRESHOLD AMPLITUDE: 0.625 V
MDC IDC MSMT LEADCHNL RA PACING THRESHOLD PULSEWIDTH: 0.4 ms
MDC IDC MSMT LEADCHNL RA SENSING INTR AMPL: 1.25 mV
MDC IDC MSMT LEADCHNL RA SENSING INTR AMPL: 1.25 mV
MDC IDC MSMT LEADCHNL RV IMPEDANCE VALUE: 646 Ohm
MDC IDC MSMT LEADCHNL RV PACING THRESHOLD AMPLITUDE: 0.875 V
MDC IDC PG IMPLANT DT: 20181115
MDC IDC SESS DTM: 20181220010140
MDC IDC SET LEADCHNL RV PACING AMPLITUDE: 3.5 V
MDC IDC SET LEADCHNL RV SENSING SENSITIVITY: 0.9 mV
MDC IDC STAT BRADY AP VP PERCENT: 3.12 %
MDC IDC STAT BRADY RA PERCENT PACED: 3.11 %
MDC IDC STAT BRADY RV PERCENT PACED: 99.9 %

## 2017-10-07 ENCOUNTER — Encounter: Payer: Self-pay | Admitting: Cardiology

## 2017-10-07 ENCOUNTER — Ambulatory Visit (INDEPENDENT_AMBULATORY_CARE_PROVIDER_SITE_OTHER): Payer: Medicare Other | Admitting: Cardiology

## 2017-10-07 VITALS — BP 130/88 | HR 80 | Ht 61.0 in | Wt 170.0 lb

## 2017-10-07 DIAGNOSIS — I441 Atrioventricular block, second degree: Secondary | ICD-10-CM

## 2017-10-07 DIAGNOSIS — I10 Essential (primary) hypertension: Secondary | ICD-10-CM

## 2017-10-07 DIAGNOSIS — I2699 Other pulmonary embolism without acute cor pulmonale: Secondary | ICD-10-CM | POA: Diagnosis not present

## 2017-10-07 DIAGNOSIS — Z950 Presence of cardiac pacemaker: Secondary | ICD-10-CM

## 2017-10-07 LAB — CUP PACEART INCLINIC DEVICE CHECK
Battery Voltage: 3.16 V
Brady Statistic AP VP Percent: 5.87 %
Brady Statistic AP VS Percent: 0 %
Brady Statistic AS VP Percent: 94.02 %
Brady Statistic AS VS Percent: 0.11 %
Implantable Lead Implant Date: 20181115
Implantable Lead Location: 753860
Implantable Lead Model: 5076
Lead Channel Impedance Value: 342 Ohm
Lead Channel Impedance Value: 532 Ohm
Lead Channel Impedance Value: 646 Ohm
Lead Channel Pacing Threshold Amplitude: 0.75 V
Lead Channel Sensing Intrinsic Amplitude: 1 mV
Lead Channel Sensing Intrinsic Amplitude: 6.5 mV
Lead Channel Setting Pacing Amplitude: 2 V
Lead Channel Setting Pacing Pulse Width: 0.4 ms
Lead Channel Setting Sensing Sensitivity: 0.9 mV
MDC IDC LEAD IMPLANT DT: 20181115
MDC IDC LEAD LOCATION: 753859
MDC IDC MSMT BATTERY REMAINING LONGEVITY: 143 mo
MDC IDC MSMT LEADCHNL RA IMPEDANCE VALUE: 513 Ohm
MDC IDC MSMT LEADCHNL RA PACING THRESHOLD AMPLITUDE: 0.75 V
MDC IDC MSMT LEADCHNL RA PACING THRESHOLD PULSEWIDTH: 0.4 ms
MDC IDC MSMT LEADCHNL RV PACING THRESHOLD PULSEWIDTH: 0.4 ms
MDC IDC PG IMPLANT DT: 20181115
MDC IDC SESS DTM: 20190215164008
MDC IDC SET LEADCHNL RV PACING AMPLITUDE: 2.5 V
MDC IDC STAT BRADY RA PERCENT PACED: 5.84 %
MDC IDC STAT BRADY RV PERCENT PACED: 99.89 %

## 2017-10-07 NOTE — Patient Instructions (Addendum)
Medication Instructions:  Your physician recommends that you continue on your current medications as directed. Please refer to the Current Medication list given to you today.  *If you need a refill on your cardiac medications before your next appointment, please call your pharmacy*  Labwork: None ordered  Testing/Procedures: None ordered  Follow-Up: Remote monitoring is used to monitor your Pacemaker or ICD from home. This monitoring reduces the number of office visits required to check your device to one time per year. It allows us to keep an eye on the functioning of your device to ensure it is working properly. You are scheduled for a device check from home on 10/13/2017. You may send your transmission at any time that day. If you have a wireless device, the transmission will be sent automatically. After your physician reviews your transmission, you will receive a postcard with your next transmission date.  Your physician wants you to follow-up in: 9 months with Dr. [name].  You will receive a reminder letter in the mail two months in advance. If you don't receive a letter, please call our office to schedule the follow-up appointment.  Thank you for choosing [facility]!!   Dory HornSherri Kerrianne Jeng, RN [phone]

## 2017-10-07 NOTE — Progress Notes (Signed)
Electrophysiology Office Note   Date:  10/07/2017   ID:  Lindsey Ross, DOB 1925-03-26, MRN [id number]  PCP:  [name], MD  Cardiologist:  Revankar Primary Electrophysiologist:  Nesa Distel Meredith Leeds, MD    Chief Complaint  Patient presents with  . Pacemaker Check    Sinus node dysfunction/Heart block     History of Present Illness: Lindsey Ross is a 84 y.o. female who is being seen today for the evaluation of 2:1 AV block at the request of Avva, Ravisankar, MD. Presenting today for electrophysiology evaluation.  She has a history of 2-1 AV block, hypertension, and pulmonary embolism.  She initially presented to the hospital with shortness of breath and was found to have a PE.  She was put on anticoagulation and re-presented for Medtronic dual-chamber pacemaker implant.    Today, she denies she is currently feeling well without major complaint.  Symptoms of palpitations, chest pain, shortness of breath, orthopnea, PND, lower extremity edema, claudication, dizziness, presyncope, syncope, bleeding, or neurologic sequela. The patient is tolerating medications without difficulties.  Her device is functioning appropriately today.  She is regaining her strength since the device was implanted.   Past Medical History:  Diagnosis Date  . Acute deep vein thrombosis (DVT) of left tibial vein ([facility]) 06/26/2017  . Aortic atherosclerosis ([facility]) 06/25/2017  . Arthritis    "right ankle; maybe some in her back" (07/07/2017)  . Burning sensation of feet    Bilateral feet  . DVT (deep venous thrombosis) ([facility]) 06/24/2017   LLE  . Frequent urination   . Heart block AV second degree 06/24/2017  . Hypertension   . Presence of permanent cardiac pacemaker   . Pulmonary embolism ([facility]) 06/24/2017  . Shortness of breath dyspnea    with exertion   Past Surgical History:  Procedure Laterality Date  . ABDOMINAL HYSTERECTOMY    . ANKLE FRACTURE SURGERY Right 2003  . CATARACT EXTRACTION W/  INTRAOCULAR LENS  IMPLANT, BILATERAL Bilateral   . FRACTURE SURGERY    . INSERT / REPLACE / REMOVE PACEMAKER  07/07/2017  . JOINT REPLACEMENT    . LAPAROSCOPIC CHOLECYSTECTOMY    . PACEMAKER IMPLANT N/A 07/07/2017   Procedure: PACEMAKER IMPLANT;  Surgeon: Constance Haw, MD;  Location: [facility];  Service: Cardiovascular;  Laterality: N/A;  . TOTAL HIP ARTHROPLASTY Right 03/25/2015   Procedure: RIGHT TOTAL HIP ARTHROPLASTY ANTERIOR APPROACH;  Surgeon: Mcarthur Rossetti, MD;  Location: [facility];  Service: Orthopedics;  Laterality: Right;     Current Outpatient Medications  Medication Sig Dispense Refill  . acetaminophen (TYLENOL) 500 MG tablet Take 1,000 mg by mouth every 6 (six) hours as needed for headache (pain).    Marland Kitchen albuterol (PROVENTIL HFA;VENTOLIN HFA) 108 (90 Base) MCG/ACT inhaler Inhale 1-2 puffs into the lungs every 6 (six) hours as needed for wheezing or shortness of breath.    . Calcium Carb-Cholecalciferol (CALCIUM + D3 PO) Take 1 tablet by mouth daily.    Marland Kitchen gabapentin (NEURONTIN) 100 MG capsule Take 200 mg by mouth 3 (three) times daily.    . Multiple Vitamins-Minerals (MULTIVITAMIN WITH MINERALS) tablet Take 1 tablet by mouth daily.    Marland Kitchen telmisartan-hydrochlorothiazide (MICARDIS HCT) 80-25 MG tablet Take 1 tablet by mouth daily.    Marland Kitchen apixaban (ELIQUIS) 5 MG TABS tablet Take 2 tablets (10 mg total) 2 (two) times daily for 7 days by mouth, THEN 1 tablet (5 mg total) 2 (two) times daily for 23 days. [place]  tablet 0   No current facility-administered medications for this visit.     Allergies:   Patient has no known allergies.   Social History:  The patient  reports that  has never smoked. she has never used smokeless tobacco. She reports that she does not drink alcohol or use drugs.   Family History:  The patient's family history is not on file.    ROS:  Please see the history of present illness.   Otherwise, review of systems is positive for back pain.   All  other systems are reviewed and negative.    PHYSICAL EXAM: VS:  BP 130/88   Pulse 80   Ht _0  (1.549 m)   Wt 170 lb (77.1 kg)   BMI 32.12 kg/m  , BMI Body mass index is 32.12 kg/m. GEN: Well nourished, well developed, in no acute distress  HEENT: normal  Neck: no JVD, carotid bruits, or masses Cardiac: RRR; no murmurs, rubs, or gallops,no edema  Respiratory:  clear to auscultation bilaterally, normal work of breathing GI: soft, nontender, nondistended, + BS MS: no deformity or atrophy  Skin: warm and dry, device pocket is well healed Neuro:  Strength and sensation are intact Psych: euthymic mood, full affect  EKG:  EKG is ordered today. Personal review of the ekg ordered shows SR, V pacing  Device interrogation is reviewed today in detail.  See PaceArt for details.   Recent Labs: 06/24/2017: ALT 12; B Natriuretic Peptide 342.1; TSH 2.775 06/25/2017: BUN 20; Creatinine, Ser 0.74; Potassium 3.7; Sodium 140 06/28/2017: Hemoglobin 11.4; Platelets 233    Lipid Panel  No results found for: CHOL, TRIG, HDL, CHOLHDL, VLDL, LDLCALC, LDLDIRECT   Wt Readings from Last 3 Encounters:  10/07/17 170 lb (77.1 kg)  07/08/17 179 lb 11.2 oz (81.5 kg)  06/24/17 174 lb (78.9 kg)      Other studies Reviewed: Additional studies/ records that were reviewed today include: TTE 06/26/17  Review of the above records today demonstrates:  - Left ventricle: The cavity size was normal. There was moderate   concentric hypertrophy. Systolic function was normal. The   estimated ejection fraction was in the range of 60% to 65%. Wall   motion was normal; there were no regional wall motion   abnormalities. Doppler parameters are consistent with abnormal   left ventricular relaxation (grade 1 diastolic dysfunction). The   E/e&' ratio is >15, suggesting elevated LV filling pressure. - Aortic valve: Mildly calcified leaflets. There was no stenosis.   There was no significant regurgitation. - Mitral  valve: Heavy MAC. Mild regurgitation. Valve area by   continuity equation (using LVOT flow): 2.08 cm^2. - Left atrium: Severely dilated. - Right ventricle: The cavity size was mildly dilated. Systolic   function was normal. - Right atrium: The atrium was mildly dilated. - Atrial septum: No defect or patent foramen ovale was identified. - Tricuspid valve: There was moderate regurgitation. - Pulmonary arteries: PA peak pressure: 77 mm Hg (S). - Inferior vena cava: The vessel was dilated. The respirophasic   diameter changes were blunted (< 50%), consistent with elevated   central venous pressure.   ASSESSMENT AND PLAN:  1.  Due to 1 AV block: Medtronic dual-chamber pacemaker implanted 07/07/17.  Functioning appropriately.  No changes at this time.  2.  Hypertension: Blood pressure currently well controlled.  No changes.  3.  Pulmonary embolism: Currently anticoagulated with Eliquis.  Per primary physician.    Current medicines are reviewed at length with the  patient today.   The patient does not have concerns regarding her medicines.  The following changes were made today:  none  Labs/ tests ordered today include:  Orders Placed This Encounter  Procedures  . EKG 12-Lead     Disposition:   FU with Ashlley Booher 9 months  Signed, Tonya Carlile Meredith Leeds, MD  10/07/2017 12:30 PM     [address] [place] [id number] [phone] (office) [phone] (fax)

## 2017-10-13 ENCOUNTER — Ambulatory Visit (INDEPENDENT_AMBULATORY_CARE_PROVIDER_SITE_OTHER): Payer: Self-pay | Admitting: *Deleted

## 2017-10-13 ENCOUNTER — Encounter: Payer: Self-pay | Admitting: Cardiology

## 2017-10-13 DIAGNOSIS — Z950 Presence of cardiac pacemaker: Secondary | ICD-10-CM

## 2017-10-13 NOTE — Progress Notes (Signed)
Remote pacemaker transmission.   

## 2017-10-13 NOTE — Progress Notes (Signed)
Letter  

## 2017-10-17 LAB — CUP PACEART REMOTE DEVICE CHECK
Battery Voltage: 3.16 V
Brady Statistic AP VP Percent: 12.25 %
Brady Statistic AP VS Percent: 0 %
Brady Statistic AS VP Percent: 87.48 %
Brady Statistic RA Percent Paced: 12.17 %
Brady Statistic RV Percent Paced: 99.72 %
Date Time Interrogation Session: 20190221065626
Implantable Lead Implant Date: 20181115
Implantable Lead Implant Date: 20181115
Implantable Lead Location: 753860
Implantable Lead Model: 5076
Implantable Pulse Generator Implant Date: 20181115
Lead Channel Impedance Value: 418 Ohm
Lead Channel Impedance Value: 494 Ohm
Lead Channel Impedance Value: 589 Ohm
Lead Channel Pacing Threshold Amplitude: 0.75 V
Lead Channel Pacing Threshold Pulse Width: 0.4 ms
Lead Channel Sensing Intrinsic Amplitude: 1.125 mV
Lead Channel Sensing Intrinsic Amplitude: 1.125 mV
Lead Channel Setting Pacing Amplitude: 2 V
Lead Channel Setting Sensing Sensitivity: 0.9 mV
MDC IDC LEAD LOCATION: 753859
MDC IDC MSMT BATTERY REMAINING LONGEVITY: 140 mo
MDC IDC MSMT LEADCHNL RA IMPEDANCE VALUE: 323 Ohm
MDC IDC MSMT LEADCHNL RV PACING THRESHOLD AMPLITUDE: 0.875 V
MDC IDC MSMT LEADCHNL RV PACING THRESHOLD PULSEWIDTH: 0.4 ms
MDC IDC MSMT LEADCHNL RV SENSING INTR AMPL: 6.5 mV
MDC IDC SET LEADCHNL RV PACING AMPLITUDE: 2.5 V
MDC IDC SET LEADCHNL RV PACING PULSEWIDTH: 0.4 ms
MDC IDC STAT BRADY AS VS PERCENT: 0.28 %

## 2017-12-26 ENCOUNTER — Telehealth: Payer: Self-pay | Admitting: *Deleted

## 2017-12-26 NOTE — Telephone Encounter (Signed)
Daughter phoned concerned about mother. She is struggling with shortness of breath and has been for at least 1 week. Pt had shortness of breath in Nov. 2018 and saw Dr. Tomie China. Diagnosed with blood clots in lung and had pacemaker put in. Pt improved greatly after being treated and felt back to her old self. Now the shortness of breath is back and pt is concerned the blood clots may be back. Please advise.

## 2017-12-26 NOTE — Telephone Encounter (Signed)
Patient complains of shortness of breath on exertion and increased fatigue. Patient states she is taking eliquis 5 mg twice daily and telmisartan-hydrochlorothiazide 80-25 mg daily. Patient denies chest pain. Schedule patient an office visit appointment with Dr. Janyce Llanos on Wednesday, 12/28/2017 at 11:00. Advised patient to go to the ED if she had worsening shortness of breath. Patient verbalized understanding. No further questions.

## 2017-12-28 ENCOUNTER — Encounter: Payer: Self-pay | Admitting: Cardiology

## 2017-12-28 ENCOUNTER — Ambulatory Visit (INDEPENDENT_AMBULATORY_CARE_PROVIDER_SITE_OTHER): Payer: Medicare Other | Admitting: Cardiology

## 2017-12-28 VITALS — BP 160/82 | HR 78 | Ht 61.0 in | Wt 174.8 lb

## 2017-12-28 DIAGNOSIS — I2699 Other pulmonary embolism without acute cor pulmonale: Secondary | ICD-10-CM

## 2017-12-28 DIAGNOSIS — I700 Atherosclerosis of aorta: Secondary | ICD-10-CM | POA: Diagnosis not present

## 2017-12-28 DIAGNOSIS — I10 Essential (primary) hypertension: Secondary | ICD-10-CM | POA: Diagnosis not present

## 2017-12-28 DIAGNOSIS — R0602 Shortness of breath: Secondary | ICD-10-CM | POA: Diagnosis not present

## 2017-12-28 DIAGNOSIS — I272 Pulmonary hypertension, unspecified: Secondary | ICD-10-CM | POA: Insufficient documentation

## 2017-12-28 HISTORY — DX: Pulmonary hypertension, unspecified: I27.20

## 2017-12-28 MED ORDER — POTASSIUM CHLORIDE ER 10 MEQ PO TBCR: 10.0000 meq | EXTENDED_RELEASE_TABLET | Freq: Every day | ORAL | 6 refills | Status: DC

## 2017-12-28 MED ORDER — FUROSEMIDE 20 MG PO TABS: 20.0000 mg | ORAL_TABLET | Freq: Every day | ORAL | 6 refills | Status: DC

## 2017-12-28 NOTE — Progress Notes (Signed)
Cardiology Office Note:    Date:  12/28/2017   ID:  Lindsey Ross, DOB 14-Mar-1925, MRN [id number]  PCP:  Chilton Greathouse, MD  Cardiologist:  Gypsy Balsam, MD    Referring MD: Chilton Greathouse, MD   Chief Complaint  Patient presents with  . Shortness of Breath  . Fatigue  Short of breath  History of Present Illness:    Lindsey Ross is a 84 y.o. female with recent history of pulmonary emboli.  She also was find to have significant AV conduction abnormality and eventually she underwent dual-chamber pacemaker implantation.  That was at the end of the last year after that she felt much better she was able to do more things however it gradually slowly over.  Of last 2 months shortness of breath increase again.  Any effort will bring shortness of breath.  There is no swelling of lower extremities there is no proximal nocturnal dyspnea there is no palpitations.  There is no chest pain.  But she complained of having back pain.  This is the complaint that she had previously as well.  She was given some Neurontin with limited response.  Past Medical History:  Diagnosis Date  . Acute deep vein thrombosis (DVT) of left tibial vein ([facility]) 06/26/2017  . Aortic atherosclerosis ([facility]) 06/25/2017  . Arthritis    "right ankle; maybe some in her back" (07/07/2017)  . Burning sensation of feet    Bilateral feet  . DVT (deep venous thrombosis) ([facility]) 06/24/2017   LLE  . Frequent urination   . Heart block AV second degree 06/24/2017  . Hypertension   . Presence of permanent cardiac pacemaker   . Pulmonary embolism ([facility]) 06/24/2017  . Shortness of breath dyspnea    with exertion    Past Surgical History:  Procedure Laterality Date  . ABDOMINAL HYSTERECTOMY    . ANKLE FRACTURE SURGERY Right 2003  . CATARACT EXTRACTION W/ INTRAOCULAR LENS  IMPLANT, BILATERAL Bilateral   . FRACTURE SURGERY    . INSERT / REPLACE / REMOVE PACEMAKER  07/07/2017  . JOINT REPLACEMENT    . LAPAROSCOPIC  CHOLECYSTECTOMY    . PACEMAKER IMPLANT N/A 07/07/2017   Procedure: PACEMAKER IMPLANT;  Surgeon: Regan Lemming, MD;  Location: [facility];  Service: Cardiovascular;  Laterality: N/A;  . TOTAL HIP ARTHROPLASTY Right 03/25/2015   Procedure: RIGHT TOTAL HIP ARTHROPLASTY ANTERIOR APPROACH;  Surgeon: Kathryne Hitch, MD;  Location: [facility];  Service: Orthopedics;  Laterality: Right;    Current Medications: Current Meds  Medication Sig  . acetaminophen (TYLENOL) 500 MG tablet Take 1,000 mg by mouth every 6 (six) hours as needed for headache (pain).  Marland Kitchen albuterol (PROVENTIL HFA;VENTOLIN HFA) 108 (90 Base) MCG/ACT inhaler Inhale 1-2 puffs into the lungs every 6 (six) hours as needed for wheezing or shortness of breath.  Marland Kitchen apixaban (ELIQUIS) 5 MG TABS tablet Take 2 tablets (10 mg total) 2 (two) times daily for 7 days by mouth, THEN 1 tablet (5 mg total) 2 (two) times daily for 23 days.  . Calcium Carb-Cholecalciferol (CALCIUM + D3 PO) Take 1 tablet by mouth daily.  Marland Kitchen gabapentin (NEURONTIN) 100 MG capsule Take 200 mg by mouth 3 (three) times daily.  . Multiple Vitamins-Minerals (MULTIVITAMIN WITH MINERALS) tablet Take 1 tablet by mouth daily.  Marland Kitchen telmisartan-hydrochlorothiazide (MICARDIS HCT) 80-25 MG tablet Take 1 tablet by mouth daily.     Allergies:   Patient has no known allergies.   Social History   Socioeconomic  History  . Marital status: Married    Spouse name: Not on file  . Number of children: Not on file  . Years of education: Not on file  . Highest education level: Not on file  Occupational History  . Not on file  Social Needs  . Financial resource strain: Not on file  . Food insecurity:    Worry: Not on file    Inability: Not on file  . Transportation needs:    Medical: Not on file    Non-medical: Not on file  Tobacco Use  . Smoking status: Never Smoker  . Smokeless tobacco: Never Used  Substance and Sexual Activity  . Alcohol use: No  . Drug use: No  .  Sexual activity: Not on file  Lifestyle  . Physical activity:    Days per week: Not on file    Minutes per session: Not on file  . Stress: Not on file  Relationships  . Social connections:    Talks on phone: Not on file    Gets together: Not on file    Attends religious service: Not on file    Active member of club or organization: Not on file    Attends meetings of clubs or organizations: Not on file    Relationship status: Not on file  Other Topics Concern  . Not on file  Social History Narrative  . Not on file     Family History: The patient's family history is negative for Pulmonary embolism. ROS:   Please see the history of present illness.    All 14 point review of systems negative except as described per history of present illness  EKGs/Labs/Other Studies Reviewed:      Recent Labs: 06/24/2017: ALT 12; B Natriuretic Peptide 342.1; TSH 2.775 06/25/2017: BUN 20; Creatinine, Ser 0.74; Potassium 3.7; Sodium 140 06/28/2017: Hemoglobin 11.4; Platelets 233  Recent Lipid Panel No results found for: CHOL, TRIG, HDL, CHOLHDL, VLDL, LDLCALC, LDLDIRECT  Physical Exam:    VS:  BP (!) 160/82   Pulse 78   Ht  (1.549 m)   Wt 174 lb 12.8 oz (79.3 kg)   SpO2 96%   BMI 33.03 kg/m     Wt Readings from Last 3 Encounters:  12/28/17 174 lb 12.8 oz (79.3 kg)  10/07/17 170 lb (77.1 kg)  07/08/17 179 lb 11.2 oz (81.5 kg)     GEN:  Well nourished, well developed in no acute distress HEENT: Normal NECK: No JVD; No carotid bruits LYMPHATICS: No lymphadenopathy CARDIAC: RRR, no murmurs, no rubs, no gallops RESPIRATORY: Bilateral basal crackles ABDOMEN: Soft, non-tender, non-distended MUSCULOSKELETAL:  No edema; No deformity  SKIN: Warm and dry LOWER EXTREMITIES: no swelling NEUROLOGIC:  Alert and oriented x 3 PSYCHIATRIC:  Normal affect   ASSESSMENT:    1. SOB (shortness of breath) on exertion   2. Other acute pulmonary embolism without acute cor pulmonale ([facility])   3.  Aortic atherosclerosis ([facility])   4. Pulmonary hypertension, unspecified ([facility])    PLAN:    In order of problems listed above:  1. Shortness of breath: She does have pulmonary hypertension based on echocardiogram done before pulmonary emboli.  Pulmonary pressure was in the neighborhood of 70 mmHg.  Acute pulmonary emboli will not give people pulmonary pressure of 70 mmHg.  If it is truly elevated pulmonary pressure there was some baseline abnormality with pulmonary pressure.  I think the key right now will be to repeat her echocardiogram to recheck in pulmonary  artery pressure if it is 70 or may be worse, aggressive investigation need to be performed which may include right-sided cardiac catheterization.  She does snores so in the future we may look for sleep apnea.  Obviously pulmonary emboli is probably contributing to this clinical scenario.  In my physical examination I can hear crackles on both bases.  Look at the CT of her chest that was done in October while diagnosis of pulmonary emboli has been established.  She did have some atelectasis there.  I will ask her to have Chem-7 done as well as proBNP.  I will also give her small dose of extra diuretic she takes hydrochlorthiazide 25 I gave her 20 mg of furosemide with family events of potassium.  But future recommendation for aggressive therapy will be made based on the results of her Chem-7 and proBNP.  Also, today we will ask her to walk around and see if there is significant drop in oxygen saturation. 2. History of pulmonary emboli.  I will not rescan her chest right now however if work-up will be unrevealing or if he will see significant increase in pulmonary artery pressure as CT may need to be repeated. 3. Pulmonary hypertension: Discussion as above.   Medication Adjustments/Labs and Tests Ordered: Current medicines are reviewed at length with the patient today.  Concerns regarding medicines are outlined above.  No orders of the defined types  were placed in this encounter.  Medication changes: No orders of the defined types were placed in this encounter.   Signed, Georgeanna Lea, MD, [facility] 12/28/2017 11:31 AM    [facility]

## 2017-12-28 NOTE — Patient Instructions (Signed)
Medication Instructions:  Your physician has recommended you make the following change in your medication:  START furosemide (lasix) 20 mg daily  START potassium chloride 10 mEq daily   Labwork: Your physician recommends that you return for lab work today: CBC, BMP, and ProBNP.  Testing/Procedures: Your physician has requested that you have an echocardiogram. Echocardiography is a painless test that uses sound waves to create images of your heart. It provides your doctor with information about the size and shape of your heart and how well your heart's chambers and valves are working. This procedure takes approximately one hour. There are no restrictions for this procedure. This will be done at the [facility] office. The address is [address] [place], [place] [address].   Follow-Up: Your physician recommends that you schedule a follow-up appointment in: 2 weeks.   Any Other Special Instructions Will Be Listed Below (If Applicable).     If you need a refill on your cardiac medications before your next appointment, please call your pharmacy.

## 2017-12-29 ENCOUNTER — Telehealth: Payer: Self-pay | Admitting: *Deleted

## 2017-12-29 LAB — BASIC METABOLIC PANEL
BUN / CREAT RATIO: 26 (ref 12–28)
BUN: 22 mg/dL (ref 10–36)
CALCIUM: 10.8 mg/dL — AB (ref 8.7–10.3)
CHLORIDE: 100 mmol/L (ref 96–106)
CO2: 25 mmol/L (ref 20–29)
Creatinine, Ser: 0.85 mg/dL (ref 0.57–1.00)
GFR calc non Af Amer: 61 mL/min/{1.73_m2} (ref >59)
GFR, EST AFRICAN AMERICAN: 70 mL/min/{1.73_m2} (ref >59)
Glucose: 99 mg/dL (ref 65–99)
POTASSIUM: 5 mmol/L (ref 3.5–5.2)
Sodium: 141 mmol/L (ref 134–144)

## 2017-12-29 LAB — CBC
Hematocrit: 45.2 % (ref 34.0–46.6)
Hemoglobin: 14.6 g/dL (ref 11.1–15.9)
MCH: 27.2 pg (ref 26.6–33.0)
MCHC: 32.3 g/dL (ref 31.5–35.7)
MCV: 84 fL (ref 79–97)
PLATELETS: 221 10*3/uL (ref 150–379)
RBC: 5.37 x10E6/uL — AB (ref 3.77–5.28)
RDW: 16 % — AB (ref 12.3–15.4)
WBC: 6.6 10*3/uL (ref 3.4–10.8)

## 2017-12-29 LAB — PRO B NATRIURETIC PEPTIDE: NT-Pro BNP: 291 pg/mL (ref 0–738)

## 2017-12-29 NOTE — Telephone Encounter (Signed)
-----   Message from Georgeanna Lea, MD sent at 12/29/2017  1:16 PM EDT ----- Labs are ok, not much CHF, cont furosemide 20 mg po qd  But stop potassium

## 2017-12-29 NOTE — Telephone Encounter (Signed)
Patient informed of results. Patient advised to discontinue potassium 10 mEq and continue taking furosemide 20 mg daily. Patient verbalized understanding. No further questions.

## 2018-01-02 ENCOUNTER — Ambulatory Visit (HOSPITAL_COMMUNITY): Payer: Medicare Other | Attending: Internal Medicine

## 2018-01-02 ENCOUNTER — Other Ambulatory Visit: Payer: Self-pay

## 2018-01-02 DIAGNOSIS — I080 Rheumatic disorders of both mitral and aortic valves: Secondary | ICD-10-CM | POA: Insufficient documentation

## 2018-01-02 DIAGNOSIS — Z86711 Personal history of pulmonary embolism: Secondary | ICD-10-CM | POA: Diagnosis not present

## 2018-01-02 DIAGNOSIS — I119 Hypertensive heart disease without heart failure: Secondary | ICD-10-CM | POA: Insufficient documentation

## 2018-01-02 DIAGNOSIS — Z86718 Personal history of other venous thrombosis and embolism: Secondary | ICD-10-CM | POA: Diagnosis not present

## 2018-01-02 DIAGNOSIS — I272 Pulmonary hypertension, unspecified: Secondary | ICD-10-CM | POA: Insufficient documentation

## 2018-01-02 DIAGNOSIS — I77810 Thoracic aortic ectasia: Secondary | ICD-10-CM | POA: Insufficient documentation

## 2018-01-04 DIAGNOSIS — R82998 Other abnormal findings in urine: Secondary | ICD-10-CM | POA: Diagnosis not present

## 2018-01-04 DIAGNOSIS — E1151 Type 2 diabetes mellitus with diabetic peripheral angiopathy without gangrene: Secondary | ICD-10-CM | POA: Diagnosis not present

## 2018-01-04 DIAGNOSIS — M810 Age-related osteoporosis without current pathological fracture: Secondary | ICD-10-CM | POA: Diagnosis not present

## 2018-01-04 DIAGNOSIS — E7849 Other hyperlipidemia: Secondary | ICD-10-CM | POA: Diagnosis not present

## 2018-01-11 ENCOUNTER — Telehealth: Payer: Self-pay

## 2018-01-11 DIAGNOSIS — Z0000 Encounter for general adult medical examination without abnormal findings: Secondary | ICD-10-CM | POA: Diagnosis not present

## 2018-01-11 DIAGNOSIS — Z1389 Encounter for screening for other disorder: Secondary | ICD-10-CM | POA: Diagnosis not present

## 2018-01-11 DIAGNOSIS — E1151 Type 2 diabetes mellitus with diabetic peripheral angiopathy without gangrene: Secondary | ICD-10-CM | POA: Diagnosis not present

## 2018-01-11 DIAGNOSIS — I2789 Other specified pulmonary heart diseases: Secondary | ICD-10-CM | POA: Diagnosis not present

## 2018-01-11 DIAGNOSIS — R0602 Shortness of breath: Secondary | ICD-10-CM | POA: Diagnosis not present

## 2018-01-11 DIAGNOSIS — N183 Chronic kidney disease, stage 3 (moderate): Secondary | ICD-10-CM | POA: Diagnosis not present

## 2018-01-11 DIAGNOSIS — I82402 Acute embolism and thrombosis of unspecified deep veins of left lower extremity: Secondary | ICD-10-CM | POA: Diagnosis not present

## 2018-01-11 DIAGNOSIS — I7090 Unspecified atherosclerosis: Secondary | ICD-10-CM | POA: Diagnosis not present

## 2018-01-11 DIAGNOSIS — I2699 Other pulmonary embolism without acute cor pulmonale: Secondary | ICD-10-CM | POA: Diagnosis not present

## 2018-01-11 DIAGNOSIS — E7849 Other hyperlipidemia: Secondary | ICD-10-CM | POA: Diagnosis not present

## 2018-01-11 DIAGNOSIS — Z6831 Body mass index (BMI) 31.0-31.9, adult: Secondary | ICD-10-CM | POA: Diagnosis not present

## 2018-01-11 DIAGNOSIS — Z7901 Long term (current) use of anticoagulants: Secondary | ICD-10-CM | POA: Diagnosis not present

## 2018-01-11 NOTE — Telephone Encounter (Signed)
Sent notes to [place] [facility]

## 2018-01-12 ENCOUNTER — Telehealth: Payer: Self-pay

## 2018-01-12 ENCOUNTER — Ambulatory Visit (INDEPENDENT_AMBULATORY_CARE_PROVIDER_SITE_OTHER): Payer: Medicare Other | Admitting: *Deleted

## 2018-01-12 DIAGNOSIS — I441 Atrioventricular block, second degree: Secondary | ICD-10-CM

## 2018-01-12 DIAGNOSIS — I498 Other specified cardiac arrhythmias: Secondary | ICD-10-CM

## 2018-01-12 NOTE — Telephone Encounter (Signed)
I spoke with the patient about this.

## 2018-01-13 ENCOUNTER — Ambulatory Visit (INDEPENDENT_AMBULATORY_CARE_PROVIDER_SITE_OTHER): Payer: Medicare Other | Admitting: Cardiology

## 2018-01-13 ENCOUNTER — Encounter: Payer: Self-pay | Admitting: Cardiology

## 2018-01-13 VITALS — BP 130/66 | HR 92 | Ht 61.0 in | Wt 173.1 lb

## 2018-01-13 DIAGNOSIS — I272 Pulmonary hypertension, unspecified: Secondary | ICD-10-CM

## 2018-01-13 DIAGNOSIS — I2699 Other pulmonary embolism without acute cor pulmonale: Secondary | ICD-10-CM

## 2018-01-13 DIAGNOSIS — I700 Atherosclerosis of aorta: Secondary | ICD-10-CM | POA: Diagnosis not present

## 2018-01-13 DIAGNOSIS — I498 Other specified cardiac arrhythmias: Secondary | ICD-10-CM

## 2018-01-13 DIAGNOSIS — Z950 Presence of cardiac pacemaker: Secondary | ICD-10-CM | POA: Diagnosis not present

## 2018-01-13 HISTORY — DX: Presence of cardiac pacemaker: Z95.0

## 2018-01-13 LAB — CUP PACEART REMOTE DEVICE CHECK
Battery Voltage: 3.11 V
Brady Statistic AP VP Percent: 14.16 %
Brady Statistic AP VS Percent: 0 %
Brady Statistic AS VP Percent: 85.61 %
Brady Statistic AS VS Percent: 0.23 %
Brady Statistic RA Percent Paced: 14.1 %
Implantable Lead Implant Date: 20181115
Implantable Lead Location: 753859
Lead Channel Impedance Value: 494 Ohm
Lead Channel Impedance Value: 570 Ohm
Lead Channel Impedance Value: 608 Ohm
Lead Channel Pacing Threshold Amplitude: 0.875 V
Lead Channel Pacing Threshold Pulse Width: 0.4 ms
Lead Channel Sensing Intrinsic Amplitude: 1.25 mV
Lead Channel Sensing Intrinsic Amplitude: 1.25 mV
Lead Channel Sensing Intrinsic Amplitude: 6.5 mV
Lead Channel Setting Pacing Amplitude: 2.5 V
Lead Channel Setting Pacing Pulse Width: 0.4 ms
Lead Channel Setting Sensing Sensitivity: 0.9 mV
MDC IDC LEAD IMPLANT DT: 20181115
MDC IDC LEAD LOCATION: 753860
MDC IDC MSMT BATTERY REMAINING LONGEVITY: 136 mo
MDC IDC MSMT LEADCHNL RA IMPEDANCE VALUE: 323 Ohm
MDC IDC MSMT LEADCHNL RA PACING THRESHOLD AMPLITUDE: 1.125 V
MDC IDC MSMT LEADCHNL RA PACING THRESHOLD PULSEWIDTH: 0.4 ms
MDC IDC PG IMPLANT DT: 20181115
MDC IDC SESS DTM: 20190523194327
MDC IDC SET LEADCHNL RA PACING AMPLITUDE: 2.25 V
MDC IDC STAT BRADY RV PERCENT PACED: 99.76 %

## 2018-01-13 NOTE — Progress Notes (Signed)
Cardiology Office Note:    Date:  01/13/2018   ID:  Lindsey Ross, DOB 1925/07/08, MRN [id number]  PCP:  Chilton Greathouse, MD  Cardiologist:  Gypsy Balsam, MD    Referring MD: Chilton Greathouse, MD   Chief Complaint  Patient presents with  . 2 Week Follow-up  Doing better  History of Present Illness:    Lindsey Ross is a 84 y.o. female with pulmonary emboli/DVT.  Treated with anticoagulation also status post pacemaker implantation last time I have seen her she was complaining having shortness of breath and fatigue however now she is doing much better for last 2 days she was able to do much more.  Her cardiogram has been repeated looking for potential complication of pacemaker implantation likely that with known ejection fraction was normal she does have pulmonary hypertension however measurements on last echocardiogram showed pulmonary artery pressure in the neighborhood of 50 mmHg which is 20 mm less than previously.  Past Medical History:  Diagnosis Date  . Acute deep vein thrombosis (DVT) of left tibial vein (HCC) 06/26/2017  . Aortic atherosclerosis (HCC) 06/25/2017  . Arthritis    "right ankle; maybe some in her back" (07/07/2017)  . Burning sensation of feet    Bilateral feet  . DVT (deep venous thrombosis) (HCC) 06/24/2017   LLE  . Frequent urination   . Heart block AV second degree 06/24/2017  . Hypertension   . Presence of permanent cardiac pacemaker   . Pulmonary embolism (HCC) 06/24/2017  . Shortness of breath dyspnea    with exertion    Past Surgical History:  Procedure Laterality Date  . ABDOMINAL HYSTERECTOMY    . ANKLE FRACTURE SURGERY Right 2003  . CATARACT EXTRACTION W/ INTRAOCULAR LENS  IMPLANT, BILATERAL Bilateral   . FRACTURE SURGERY    . INSERT / REPLACE / REMOVE PACEMAKER  07/07/2017  . JOINT REPLACEMENT    . LAPAROSCOPIC CHOLECYSTECTOMY    . PACEMAKER IMPLANT N/A 07/07/2017   Procedure: PACEMAKER IMPLANT;  Surgeon: Regan Lemming,  MD;  [facility];  Service: Cardiovascular;  Laterality: N/A;  . TOTAL HIP ARTHROPLASTY Right 03/25/2015   Procedure: RIGHT TOTAL HIP ARTHROPLASTY ANTERIOR APPROACH;  Surgeon: Kathryne Hitch, MD;  Location: [facility];  Service: Orthopedics;  Laterality: Right;    Current Medications: Current Meds  Medication Sig  . acetaminophen (TYLENOL) 500 MG tablet Take 1,000 mg by mouth every 6 (six) hours as needed for headache (pain).  Marland Kitchen albuterol (PROVENTIL HFA;VENTOLIN HFA) 108 (90 Base) MCG/ACT inhaler Inhale 1-2 puffs into the lungs every 6 (six) hours as needed for wheezing or shortness of breath.  Marland Kitchen apixaban (ELIQUIS) 5 MG TABS tablet Take 2 tablets (10 mg total) 2 (two) times daily for 7 days by mouth, THEN 1 tablet (5 mg total) 2 (two) times daily for 23 days.  . Calcium Carb-Cholecalciferol (CALCIUM + D3 PO) Take 1 tablet by mouth daily.  . furosemide (LASIX) 20 MG tablet Take 1 tablet (20 mg total) by mouth daily.  Marland Kitchen gabapentin (NEURONTIN) 100 MG capsule Take 200 mg by mouth 3 (three) times daily.  . Multiple Vitamins-Minerals (MULTIVITAMIN WITH MINERALS) tablet Take 1 tablet by mouth daily.  Marland Kitchen telmisartan-hydrochlorothiazide (MICARDIS HCT) 80-25 MG tablet Take 1 tablet by mouth daily.     Allergies:   Patient has no known allergies.   Social History   Socioeconomic History  . Marital status: Married    Spouse name: Not on file  . Number  of children: Not on file  . Years of education: Not on file  . Highest education level: Not on file  Occupational History  . Not on file  Social Needs  . Financial resource strain: Not on file  . Food insecurity:    Worry: Not on file    Inability: Not on file  . Transportation needs:    Medical: Not on file    Non-medical: Not on file  Tobacco Use  . Smoking status: Never Smoker  . Smokeless tobacco: Never Used  Substance and Sexual Activity  . Alcohol use: No  . Drug use: No  . Sexual activity: Not on file  Lifestyle    . Physical activity:    Days per week: Not on file    Minutes per session: Not on file  . Stress: Not on file  Relationships  . Social connections:    Talks on phone: Not on file    Gets together: Not on file    Attends religious service: Not on file    Active member of club or organization: Not on file    Attends meetings of clubs or organizations: Not on file    Relationship status: Not on file  Other Topics Concern  . Not on file  Social History Narrative  . Not on file     Family History: The patient's family history is negative for Pulmonary embolism. ROS:   Please see the history of present illness.    All 14 point review of systems negative except as described per history of present illness  EKGs/Labs/Other Studies Reviewed:      Recent Labs: 06/24/2017: ALT 12; B Natriuretic Peptide 342.1; TSH 2.775 12/28/2017: BUN 22; Creatinine, Ser 0.85; Hemoglobin 14.6; NT-Pro BNP 291; Platelets 221; Potassium 5.0; Sodium 141  Recent Lipid Panel No results found for: CHOL, TRIG, HDL, CHOLHDL, VLDL, LDLCALC, LDLDIRECT  Physical Exam:    VS:  BP 130/66   Pulse 92   Ht  (1.549 m)   Wt 173 lb 1.9 oz (78.5 kg)   SpO2 94%   BMI 32.71 kg/m     Wt Readings from Last 3 Encounters:  01/13/18 173 lb 1.9 oz (78.5 kg)  12/28/17 174 lb 12.8 oz (79.3 kg)  10/07/17 170 lb (77.1 kg)     GEN:  Well nourished, well developed in no acute distress HEENT: Normal NECK: No JVD; No carotid bruits LYMPHATICS: No lymphadenopathy CARDIAC: RRR, no murmurs, no rubs, no gallops RESPIRATORY:  Clear to auscultation without rales, wheezing or rhonchi  ABDOMEN: Soft, non-tender, non-distended MUSCULOSKELETAL:  No edema; No deformity  SKIN: Warm and dry LOWER EXTREMITIES: no swelling NEUROLOGIC:  Alert and oriented x 3 PSYCHIATRIC:  Normal affect   ASSESSMENT:    1. Other acute pulmonary embolism without acute cor pulmonale (HCC)   2. Pulmonary hypertension, unspecified (HCC)   3.  Aortic atherosclerosis (HCC)   4. Bradyarrhythmia   5. Pacemaker    PLAN:    In order of problems listed above:  1. History of pulmonary emboli.  Doing well we will continue present management which include anticoagulation 2. Pulmonary hypertension with pulmonary pressure now in the neighborhood of 50.  Overall clinically she is doing better we will continue monitoring in the future she probably will locate sleep study if she will have worsening of pulmonary hypertension we may consider cardiac catheterization but for now seems to have clinical improvement I will continue conservative approach 3. Aortic atherosclerosis noted.  Her cholesterol  is acceptable. 4. Bradycardia: This being addressed with pacemaker implantation.  We will continue present management   Medication Adjustments/Labs and Tests Ordered: Current medicines are reviewed at length with the patient today.  Concerns regarding medicines are outlined above.  No orders of the defined types were placed in this encounter.  Medication changes: No orders of the defined types were placed in this encounter.   Signed, Georgeanna Lea, MD, [facility] 01/13/2018 3:37 PM    [facility]

## 2018-01-13 NOTE — Progress Notes (Signed)
Remote pacemaker transmission.   

## 2018-01-13 NOTE — Patient Instructions (Addendum)
Medication Instructions:  Your physician recommends that you continue on your current medications as directed. Please refer to the Current Medication list given to you today.   Labwork: None  Testing/Procedures: None  Follow-Up: Your physician recommends that you schedule a follow-up appointment in: 3 weeks.   If you need a refill on your cardiac medications before your next appointment, please call your pharmacy.   Thank you for choosing [facility]! Catherine Lockhart, RN [phone]    

## 2018-01-17 DIAGNOSIS — Z1212 Encounter for screening for malignant neoplasm of rectum: Secondary | ICD-10-CM | POA: Diagnosis not present

## 2018-02-06 ENCOUNTER — Ambulatory Visit (INDEPENDENT_AMBULATORY_CARE_PROVIDER_SITE_OTHER): Payer: Medicare Other | Admitting: Cardiology

## 2018-02-06 ENCOUNTER — Encounter: Payer: Self-pay | Admitting: Cardiology

## 2018-02-06 VITALS — BP 120/70 | HR 77 | Ht 61.0 in | Wt 173.4 lb

## 2018-02-06 DIAGNOSIS — I2699 Other pulmonary embolism without acute cor pulmonale: Secondary | ICD-10-CM

## 2018-02-06 DIAGNOSIS — Z950 Presence of cardiac pacemaker: Secondary | ICD-10-CM | POA: Diagnosis not present

## 2018-02-06 DIAGNOSIS — I10 Essential (primary) hypertension: Secondary | ICD-10-CM

## 2018-02-06 DIAGNOSIS — I272 Pulmonary hypertension, unspecified: Secondary | ICD-10-CM | POA: Diagnosis not present

## 2018-02-06 NOTE — Patient Instructions (Signed)
Medication Instructions:  Your physician recommends that you continue on your current medications as directed. Please refer to the Current Medication list given to you today.   Labwork: NONE   Testing/Procedures: You had an EKG today   Follow-Up: Your physician recommends that you schedule a follow-up appointment in: 6 weeks,   Any Other Special Instructions Will Be Listed Below (If Applicable).     If you need a refill on your cardiac medications before your next appointment, please call your pharmacy.

## 2018-02-06 NOTE — Progress Notes (Signed)
Cardiology Office Note:    Date:  02/06/2018   ID:  Lindsey Ross, DOB 06-26-1925, MRN [id number]  PCP:  Lindsey Greathouse, MD  Cardiologist:  Lindsey Balsam, MD    Referring MD: Lindsey Greathouse, MD   Chief Complaint  Patient presents with  . 3 week follow up  Doing better but still not perfect  History of Present Illness:    Lindsey Ross is a 84 y.o. female with history of pulmonary emboli.  Also dual-chamber pacemaker.  After that she feels great then next time when I saw her she started complaining of being weak tired and exhausted.  Still describes some sensation somewhat better but she described to have good days bad days when she got better today she is very tired and she sleeps a lot denies having any chest pain tightness squeezing pressure burning chest no swelling of lower extremities  Past Medical History:  Diagnosis Date  . Acute deep vein thrombosis (DVT) of left tibial vein (HCC) 06/26/2017  . Aortic atherosclerosis (HCC) 06/25/2017  . Arthritis    "right ankle; maybe some in her back" (07/07/2017)  . Burning sensation of feet    Bilateral feet  . DVT (deep venous thrombosis) (HCC) 06/24/2017   LLE  . Frequent urination   . Heart block AV second degree 06/24/2017  . Hypertension   . Presence of permanent cardiac pacemaker   . Pulmonary embolism (HCC) 06/24/2017  . Shortness of breath dyspnea    with exertion    Past Surgical History:  Procedure Laterality Date  . ABDOMINAL HYSTERECTOMY    . ANKLE FRACTURE SURGERY Right 2003  . CATARACT EXTRACTION W/ INTRAOCULAR LENS  IMPLANT, BILATERAL Bilateral   . FRACTURE SURGERY    . INSERT / REPLACE / REMOVE PACEMAKER  07/07/2017  . JOINT REPLACEMENT    . LAPAROSCOPIC CHOLECYSTECTOMY    . PACEMAKER IMPLANT N/A 07/07/2017   Procedure: PACEMAKER IMPLANT;  Surgeon: Regan Lemming, MD;  Location: [facility];  Service: Cardiovascular;  Laterality: N/A;  . TOTAL HIP ARTHROPLASTY Right 03/25/2015   Procedure: RIGHT TOTAL HIP ARTHROPLASTY ANTERIOR APPROACH;  Surgeon: Kathryne Hitch, MD;  Location: [facility] OR;  Service: Orthopedics;  Laterality: Right;    Current Medications: Current Meds  Medication Sig  . acetaminophen (TYLENOL) 500 MG tablet Take 1,000 mg by mouth every 6 (six) hours as needed for headache (pain).  Marland Kitchen albuterol (PROVENTIL HFA;VENTOLIN HFA) 108 (90 Base) MCG/ACT inhaler Inhale 1-2 puffs into the lungs every 6 (six) hours as needed for wheezing or shortness of breath.  Marland Kitchen apixaban (ELIQUIS) 5 MG TABS tablet Take 2 tablets (10 mg total) 2 (two) times daily for 7 days by mouth, THEN 1 tablet (5 mg total) 2 (two) times daily for 23 days.  . Calcium Carb-Cholecalciferol (CALCIUM + D3 PO) Take 1 tablet by mouth daily.  . furosemide (LASIX) 20 MG tablet Take 1 tablet (20 mg total) by mouth daily.  Marland Kitchen gabapentin (NEURONTIN) 100 MG capsule Take 200 mg by mouth 3 (three) times daily.  . Multiple Vitamins-Minerals (MULTIVITAMIN WITH MINERALS) tablet Take 1 tablet by mouth daily.  Marland Kitchen telmisartan-hydrochlorothiazide (MICARDIS HCT) 80-25 MG tablet Take 1 tablet by mouth daily.     Allergies:   Patient has no known allergies.   Social History   Socioeconomic History  . Marital status: Married    Spouse name: Not on file  . Number of children: Not on file  . Years of education: Not on file  .  Highest education level: Not on file  Occupational History  . Not on file  Social Needs  . Financial resource strain: Not on file  . Food insecurity:    Worry: Not on file    Inability: Not on file  . Transportation needs:    Medical: Not on file    Non-medical: Not on file  Tobacco Use  . Smoking status: Never Smoker  . Smokeless tobacco: Never Used  Substance and Sexual Activity  . Alcohol use: No  . Drug use: No  . Sexual activity: Not on file  Lifestyle  . Physical activity:    Days per week: Not on file    Minutes per session: Not on file  . Stress: Not on file    Relationships  . Social connections:    Talks on phone: Not on file    Gets together: Not on file    Attends religious service: Not on file    Active member of club or organization: Not on file    Attends meetings of clubs or organizations: Not on file    Relationship status: Not on file  Other Topics Concern  . Not on file  Social History Narrative  . Not on file     Family History: The patient's family history is negative for Pulmonary embolism. ROS:   Please see the history of present illness.    All 14 point review of systems negative except as described per history of present illness  EKGs/Labs/Other Studies Reviewed:      Recent Labs: 06/24/2017: ALT 12; B Natriuretic Peptide 342.1; TSH 2.775 12/28/2017: BUN 22; Creatinine, Ser 0.85; Hemoglobin 14.6; NT-Pro BNP 291; Platelets 221; Potassium 5.0; Sodium 141  Recent Lipid Panel No results found for: CHOL, TRIG, HDL, CHOLHDL, VLDL, LDLCALC, LDLDIRECT  Physical Exam:    VS:  BP 120/70   Pulse 77   Ht 5\' 1"  (1.549 m)   Wt 173 lb 6.4 oz (78.7 kg)   SpO2 98%   BMI 32.76 kg/m     Wt Readings from Last 3 Encounters:  02/06/18 173 lb 6.4 oz (78.7 kg)  01/13/18 173 lb 1.9 oz (78.5 kg)  12/28/17 174 lb 12.8 oz (79.3 kg)     GEN:  Well nourished, well developed in no acute distress HEENT: Normal NECK: No JVD; No carotid bruits LYMPHATICS: No lymphadenopathy CARDIAC: RRR, no murmurs, no rubs, no gallops RESPIRATORY:  Clear to auscultation without rales, wheezing or rhonchi  ABDOMEN: Soft, non-tender, non-distended MUSCULOSKELETAL:  No edema; No deformity  SKIN: Warm and dry LOWER EXTREMITIES: no swelling NEUROLOGIC:  Alert and oriented x 3 PSYCHIATRIC:  Normal affect   ASSESSMENT:    1. Essential hypertension   2. Other acute pulmonary embolism without acute cor pulmonale (HCC)   3. Pulmonary hypertension, unspecified (HCC)   4. Pacemaker    PLAN:    In order of problems listed above:  1. Essential  hypertension: Blood pressure well controlled continue present medications. 2. History of pulmonary emboli.  We will continue present management continue anticoagulation 3. Pulmonary hypertension.  I will ask you to come back to me in 6 weeks if she still feels the same we will repeat echocardiogram if pulmonary pressure was still elevated or worse then we will consider cardiac catheterization 4.   Pacemaker: Present functioning properly I will also ask her today to have pulse EKG done, also will check pulse oximetry make her walk around  Medication Adjustments/Labs and Tests Ordered: Current medicines are  reviewed at length with the patient today.  Concerns regarding medicines are outlined above.  No orders of the defined types were placed in this encounter.  Medication changes: No orders of the defined types were placed in this encounter.   Signed, Georgeanna Lea, MD, [facility] 02/06/2018 4:15 PM     [facility]

## 2018-02-07 NOTE — Addendum Note (Signed)
Addended by: Crist FatLOCKHART, Ruberta Holck P on: 02/07/2018 09:54 AM   Modules accepted: Orders

## 2018-03-17 ENCOUNTER — Ambulatory Visit (INDEPENDENT_AMBULATORY_CARE_PROVIDER_SITE_OTHER): Payer: Medicare Other | Admitting: Cardiology

## 2018-03-17 VITALS — BP 138/80 | HR 76 | Resp 12 | Ht 64.0 in | Wt 175.0 lb

## 2018-03-17 DIAGNOSIS — Z950 Presence of cardiac pacemaker: Secondary | ICD-10-CM

## 2018-03-17 DIAGNOSIS — I498 Other specified cardiac arrhythmias: Secondary | ICD-10-CM

## 2018-03-17 DIAGNOSIS — I2782 Chronic pulmonary embolism: Secondary | ICD-10-CM

## 2018-03-17 DIAGNOSIS — I10 Essential (primary) hypertension: Secondary | ICD-10-CM

## 2018-03-17 DIAGNOSIS — I272 Pulmonary hypertension, unspecified: Secondary | ICD-10-CM | POA: Diagnosis not present

## 2018-03-17 NOTE — Progress Notes (Signed)
Cardiology Office Note:    Date:  03/17/2018   ID:  Lindsey Ross, DOB 06-Jul-1925, MRN [id number]  PCP:  Chilton Greathouse, MD  Cardiologist:  Gypsy Balsam, MD    Referring MD: Chilton Greathouse, MD   Chief Complaint  Patient presents with  . Follow-up  Doing better  History of Present Illness:    Lindsey Ross is a 84 y.o. female with pulmonary emboli and DVT she was treated appropriately however after initial improvement she has.  That she started feeling worse quite extensive evaluation has been done at the time echocardiogram has been repeated she does have elevation of pulmonary pressure but that was stable we decided to simply watch the situation because of her age and comorbidities today she comes to my office she says she feels better.  She actually will travel to [facility] to see her granddaughter graduation she had no problem flying in the plane and now she is back.  Denies having a chest pain tightness squeezing pressure burning chest.  Still cook a lot and she is planning to go to [place] within next few weeks.  Past Medical History:  Diagnosis Date  . Acute deep vein thrombosis (DVT) of left tibial vein (HCC) 06/26/2017  . Aortic atherosclerosis (HCC) 06/25/2017  . Arthritis    "right ankle; maybe some in her back" (07/07/2017)  . Burning sensation of feet    Bilateral feet  . DVT (deep venous thrombosis) (HCC) 06/24/2017   LLE  . Frequent urination   . Heart block AV second degree 06/24/2017  . Hypertension   . Presence of permanent cardiac pacemaker   . Pulmonary embolism (HCC) 06/24/2017  . Shortness of breath dyspnea    with exertion    Past Surgical History:  Procedure Laterality Date  . ABDOMINAL HYSTERECTOMY    . ANKLE FRACTURE SURGERY Right 2003  . CATARACT EXTRACTION W/ INTRAOCULAR LENS  IMPLANT, BILATERAL Bilateral   . FRACTURE SURGERY    . INSERT / REPLACE / REMOVE PACEMAKER  07/07/2017  . JOINT REPLACEMENT    . LAPAROSCOPIC CHOLECYSTECTOMY     . PACEMAKER IMPLANT N/A 07/07/2017   Procedure: PACEMAKER IMPLANT;  Surgeon: Regan Lemming, MD;  [facility]: [facility];  Service: Cardiovascular;  Laterality: N/A;  . TOTAL HIP ARTHROPLASTY Right 03/25/2015   Procedure: RIGHT TOTAL HIP ARTHROPLASTY ANTERIOR APPROACH;  Surgeon: Kathryne Hitch, MD;  Location: [facility] OR;  Service: Orthopedics;  Laterality: Right;    Current Medications: Current Meds  Medication Sig  . acetaminophen (TYLENOL) 500 MG tablet Take 1,000 mg by mouth every 6 (six) hours as needed for headache (pain).  Marland Kitchen albuterol (PROVENTIL HFA;VENTOLIN HFA) 108 (90 Base) MCG/ACT inhaler Inhale 1-2 puffs into the lungs every 6 (six) hours as needed for wheezing or shortness of breath.  Marland Kitchen apixaban (ELIQUIS) 5 MG TABS tablet Take 2 tablets (10 mg total) 2 (two) times daily for 7 days by mouth, THEN 1 tablet (5 mg total) 2 (two) times daily for 23 days.  . Calcium Carb-Cholecalciferol (CALCIUM + D3 PO) Take 1 tablet by mouth daily.  . furosemide (LASIX) 20 MG tablet Take 1 tablet (20 mg total) by mouth daily.  Marland Kitchen gabapentin (NEURONTIN) 100 MG capsule Take 200 mg by mouth 3 (three) times daily.  . Multiple Vitamins-Minerals (MULTIVITAMIN WITH MINERALS) tablet Take 1 tablet by mouth daily.  Marland Kitchen telmisartan-hydrochlorothiazide (MICARDIS HCT) 80-25 MG tablet Take 1 tablet by mouth daily.     Allergies:   Patient has  no known allergies.   Social History   Socioeconomic History  . Marital status: Married    Spouse name: Not on file  . Number of children: Not on file  . Years of education: Not on file  . Highest education level: Not on file  Occupational History  . Not on file  Social Needs  . Financial resource strain: Not on file  . Food insecurity:    Worry: Not on file    Inability: Not on file  . Transportation needs:    Medical: Not on file    Non-medical: Not on file  Tobacco Use  . Smoking status: Never Smoker  . Smokeless tobacco: Never Used    Substance and Sexual Activity  . Alcohol use: No  . Drug use: No  . Sexual activity: Not on file  Lifestyle  . Physical activity:    Days per week: Not on file    Minutes per session: Not on file  . Stress: Not on file  Relationships  . Social connections:    Talks on phone: Not on file    Gets together: Not on file    Attends religious service: Not on file    Active member of club or organization: Not on file    Attends meetings of clubs or organizations: Not on file    Relationship status: Not on file  Other Topics Concern  . Not on file  Social History Narrative  . Not on file     Family History: The patient's family history is negative for Pulmonary embolism. ROS:   Please see the history of present illness.    All 14 point review of systems negative except as described per history of present illness  EKGs/Labs/Other Studies Reviewed:      Recent Labs: 06/24/2017: ALT 12; B Natriuretic Peptide 342.1; TSH 2.775 12/28/2017: BUN 22; Creatinine, Ser 0.85; Hemoglobin 14.6; NT-Pro BNP 291; Platelets 221; Potassium 5.0; Sodium 141  Recent Lipid Panel No results found for: CHOL, TRIG, HDL, CHOLHDL, VLDL, LDLCALC, LDLDIRECT  Physical Exam:    VS:  BP 138/80   Resp 12   Ht 5\' 4"  (1.626 m)   Wt 175 lb (79.4 kg)   SpO2 97%   BMI 30.04 kg/m     Wt Readings from Last 3 Encounters:  03/17/18 175 lb (79.4 kg)  02/06/18 173 lb 6.4 oz (78.7 kg)  01/13/18 173 lb 1.9 oz (78.5 kg)     GEN:  Well nourished, well developed in no acute distress HEENT: Normal NECK: No JVD; No carotid bruits LYMPHATICS: No lymphadenopathy CARDIAC: RRR, no murmurs, no rubs, no gallops RESPIRATORY:  Clear to auscultation without rales, wheezing or rhonchi  ABDOMEN: Soft, non-tender, non-distended MUSCULOSKELETAL:  No edema; No deformity  SKIN: Warm and dry LOWER EXTREMITIES: no swelling NEUROLOGIC:  Alert and oriented x 3 PSYCHIATRIC:  Normal affect   ASSESSMENT:    1. Other chronic  pulmonary embolism without acute cor pulmonale (HCC)   2. Essential hypertension   3. Bradyarrhythmia   4. Pacemaker   5. Pulmonary hypertension, unspecified (HCC)    PLAN:    In order of problems listed above:  1. Pulmonary emboli doing well from that point to be anticoagulated which I will continue.  2. Essential hypertension blood pressure well controlled we will continue present management. 3. Bradycardia arrhythmia: Addressed with the pacemaker which was recently interrogated and functioning properly. 4. Pulmonary hypertension we will continue conservative approach within next 6 months we will repeat  echocardiogram.  I see her back in my office in about 3 months or sooner if she has a problem   Medication Adjustments/Labs and Tests Ordered: Current medicines are reviewed at length with the patient today.  Concerns regarding medicines are outlined above.  No orders of the defined types were placed in this encounter.  Medication changes: No orders of the defined types were placed in this encounter.   Signed, Georgeanna Leaobert J. Lyanne Kates, MD, [facility] 03/17/2018 4:02 PM    [facility]

## 2018-03-17 NOTE — Patient Instructions (Signed)
Medication Instructions:  Your physician recommends that you continue on your current medications as directed. Please refer to the Current Medication list given to you today.   Labwork: Nonr  Testing/Procedures: None.  Follow-Up: Your physician wants you to follow-up in: 3 months. You will receive a reminder letter in the mail two months in advance. If you don't receive a letter, please call our office to schedule the follow-up appointment.   Any Other Special Instructions Will Be Listed Below (If Applicable).     If you need a refill on your cardiac medications before your next appointment, please call your pharmacy.

## 2018-04-13 ENCOUNTER — Ambulatory Visit (INDEPENDENT_AMBULATORY_CARE_PROVIDER_SITE_OTHER): Payer: Medicare Other | Admitting: *Deleted

## 2018-04-13 DIAGNOSIS — I441 Atrioventricular block, second degree: Secondary | ICD-10-CM | POA: Diagnosis not present

## 2018-04-13 NOTE — Progress Notes (Signed)
Remote pacemaker transmission.   

## 2018-04-14 ENCOUNTER — Encounter: Payer: Self-pay | Admitting: Cardiology

## 2018-05-18 LAB — CUP PACEART REMOTE DEVICE CHECK
Battery Remaining Longevity: 131 mo
Battery Voltage: 3.03 V
Brady Statistic AS VS Percent: 0.11 %
Brady Statistic RV Percent Paced: 99.89 %
Implantable Lead Location: 753859
Implantable Lead Location: 753860
Implantable Lead Model: 5076
Implantable Pulse Generator Implant Date: 20181115
Lead Channel Impedance Value: 323 Ohm
Lead Channel Impedance Value: 570 Ohm
Lead Channel Sensing Intrinsic Amplitude: 1.125 mV
Lead Channel Sensing Intrinsic Amplitude: 6.5 mV
Lead Channel Setting Pacing Amplitude: 2.5 V
Lead Channel Setting Pacing Pulse Width: 0.4 ms
MDC IDC LEAD IMPLANT DT: 20181115
MDC IDC LEAD IMPLANT DT: 20181115
MDC IDC MSMT LEADCHNL RA PACING THRESHOLD AMPLITUDE: 1.125 V
MDC IDC MSMT LEADCHNL RA PACING THRESHOLD PULSEWIDTH: 0.4 ms
MDC IDC MSMT LEADCHNL RA SENSING INTR AMPL: 1.125 mV
MDC IDC MSMT LEADCHNL RV IMPEDANCE VALUE: 456 Ohm
MDC IDC MSMT LEADCHNL RV IMPEDANCE VALUE: 532 Ohm
MDC IDC MSMT LEADCHNL RV PACING THRESHOLD AMPLITUDE: 0.75 V
MDC IDC MSMT LEADCHNL RV PACING THRESHOLD PULSEWIDTH: 0.4 ms
MDC IDC SESS DTM: 20190822013623
MDC IDC SET LEADCHNL RA PACING AMPLITUDE: 2.25 V
MDC IDC SET LEADCHNL RV SENSING SENSITIVITY: 0.9 mV
MDC IDC STAT BRADY AP VP PERCENT: 13.81 %
MDC IDC STAT BRADY AP VS PERCENT: 0 %
MDC IDC STAT BRADY AS VP PERCENT: 86.08 %
MDC IDC STAT BRADY RA PERCENT PACED: 13.75 %

## 2018-06-12 DIAGNOSIS — Z23 Encounter for immunization: Secondary | ICD-10-CM | POA: Diagnosis not present

## 2018-06-15 ENCOUNTER — Ambulatory Visit (INDEPENDENT_AMBULATORY_CARE_PROVIDER_SITE_OTHER): Payer: Medicare Other | Admitting: Cardiology

## 2018-06-15 ENCOUNTER — Encounter: Payer: Self-pay | Admitting: Cardiology

## 2018-06-15 VITALS — BP 126/64 | HR 81 | Ht 64.0 in | Wt 174.8 lb

## 2018-06-15 DIAGNOSIS — I441 Atrioventricular block, second degree: Secondary | ICD-10-CM

## 2018-06-15 DIAGNOSIS — I272 Pulmonary hypertension, unspecified: Secondary | ICD-10-CM

## 2018-06-15 DIAGNOSIS — I2782 Chronic pulmonary embolism: Secondary | ICD-10-CM

## 2018-06-15 DIAGNOSIS — Z950 Presence of cardiac pacemaker: Secondary | ICD-10-CM | POA: Diagnosis not present

## 2018-06-15 DIAGNOSIS — I10 Essential (primary) hypertension: Secondary | ICD-10-CM

## 2018-06-15 NOTE — Patient Instructions (Signed)
Medication Instructions:  . If you need a refill on your cardiac medications before your next appointment, please call your pharmacy.   Lab work: None.  If you have labs (blood work) drawn today and your tests are completely normal, you will receive your results only by: Marland Kitchen MyChart Message (if you have MyChart) OR . A paper copy in the mail If you have any lab test that is abnormal or we need to change your treatment, we will call you to review the results.  Testing/Procedures: Your physician has requested that you have an echocardiogram. Echocardiography is a painless test that uses sound waves to create images of your heart. It provides your doctor with information about the size and shape of your heart and how well your heart's chambers and valves are working. This procedure takes approximately one hour. There are no restrictions for this procedure.    Follow-Up: At [facility], you and your health needs are our priority.  As part of our continuing mission to provide you with exceptional heart care, we have created designated Provider Care Teams.  These Care Teams include your primary Cardiologist (physician) and Advanced Practice Providers (APPs -  Physician Assistants and Nurse Practitioners) who all work together to provide you with the care you need, when you need it. You will need a follow up appointment in 4 months.  Please call our office 2 months in advance to schedule this appointment.  You may see No primary care provider on file. or another member of our [employer] Provider Team in [place]: Norman Herrlich, MD . Belva Crome, MD  Any Other Special Instructions Will Be Listed Below (If Applicable).  Echocardiogram An echocardiogram, or echocardiography, uses sound waves (ultrasound) to produce an image of your heart. The echocardiogram is simple, painless, obtained within a short period of time, and offers valuable information to your health care provider. The images  from an echocardiogram can provide information such as:  Evidence of coronary artery disease (CAD).  Heart size.  Heart muscle function.  Heart valve function.  Aneurysm detection.  Evidence of a past heart attack.  Fluid buildup around the heart.  Heart muscle thickening.  Assess heart valve function.  Tell a health care provider about:  Any allergies you have.  All medicines you are taking, including vitamins, herbs, eye drops, creams, and over-the-counter medicines.  Any problems you or family members have had with anesthetic medicines.  Any blood disorders you have.  Any surgeries you have had.  Any medical conditions you have.  Whether you are pregnant or may be pregnant. What happens before the procedure? No special preparation is needed. Eat and drink normally. What happens during the procedure?  In order to produce an image of your heart, gel will be applied to your chest and a wand-like tool (transducer) will be moved over your chest. The gel will help transmit the sound waves from the transducer. The sound waves will harmlessly bounce off your heart to allow the heart images to be captured in real-time motion. These images will then be recorded.  You may need an IV to receive a medicine that improves the quality of the pictures. What happens after the procedure? You may return to your normal schedule including diet, activities, and medicines, unless your health care provider tells you otherwise. This information is not intended to replace advice given to you by your health care provider. Make sure you discuss any questions you have with your health care provider. Document  Released: 08/06/2000 Document Revised: 03/27/2016 Document Reviewed: 04/16/2013 Elsevier Interactive Patient Education  2017 [employer].

## 2018-06-15 NOTE — Progress Notes (Signed)
Cardiology Office Note:    Date:  06/15/2018   ID:  Lindsey Ross, DOB [birth year], MRN [id number]  PCP:  Chilton Greathouse, MD  Cardiologist:  Gypsy Balsam, MD    Referring MD: Chilton Greathouse, MD   Chief Complaint  Patient presents with  . Follow-up  Doing well  History of Present Illness:    Lindsey Ross is a 84 y.o. female with history of pulmonary emboli, mild pulmonary hypertension because of her advanced age and comorbidity we elected to manage this problem conservatively while she is doing fine but described to have a little bit more shortness of breath I scheduled her to have echocardiogram in January told him shortness of breath will get worse she need to let us know that we will do echocardiogram sooner denies have any palpitation no chest pain tightness squeezing pressure burning chest.  Her husband recently had TAVR done which was complicated with artery rupture he spent 21 days in the hospital but seems to be doing well.  Past Medical History:  Diagnosis Date  . Acute deep vein thrombosis (DVT) of left tibial vein (HCC) 06/26/2017  . Aortic atherosclerosis (HCC) 06/25/2017  . Arthritis    "right ankle; maybe some in her back" (07/07/2017)  . Burning sensation of feet    Bilateral feet  . DVT (deep venous thrombosis) (HCC) 06/24/2017   LLE  . Frequent urination   . Heart block AV second degree 06/24/2017  . Hypertension   . Presence of permanent cardiac pacemaker   . Pulmonary embolism (HCC) 06/24/2017  . Shortness of breath dyspnea    with exertion    Past Surgical History:  Procedure Laterality Date  . ABDOMINAL HYSTERECTOMY    . ANKLE FRACTURE SURGERY Right 2003  . CATARACT EXTRACTION W/ INTRAOCULAR LENS  IMPLANT, BILATERAL Bilateral   . FRACTURE SURGERY    . INSERT / REPLACE / REMOVE PACEMAKER  07/07/2017  . JOINT REPLACEMENT    . LAPAROSCOPIC CHOLECYSTECTOMY    . PACEMAKER IMPLANT N/A 07/07/2017   Procedure: PACEMAKER IMPLANT;  Surgeon:  Regan Lemming, MD;  Location: [facility] IN[facility];  Service: Cardiovascular;  Laterality: N/A;  . TOTAL HIP ARTHROPLASTY Right 03/25/2015   Procedure: RIGHT TOTAL HIP ARTHROPLASTY ANTERIOR APPROACH;  Surgeon: Kathryne Hitch, MD;  Location: [facility] OR;  Service: Orthopedics;  Laterality: Right;    Current Medications: Current Meds  Medication Sig  . acetaminophen (TYLENOL) 500 MG tablet Take 1,000 mg by mouth every 6 (six) hours as needed for headache (pain).  Marland Kitchen albuterol (PROVENTIL HFA;VENTOLIN HFA) 108 (90 Base) MCG/ACT inhaler Inhale 1-2 puffs into the lungs every 6 (six) hours as needed for wheezing or shortness of breath.  Marland Kitchen apixaban (ELIQUIS) 5 MG TABS tablet Take 2 tablets (10 mg total) 2 (two) times daily for 7 days by mouth, THEN 1 tablet (5 mg total) 2 (two) times daily for 23 days.  . Calcium Carb-Cholecalciferol (CALCIUM + D3 PO) Take 1 tablet by mouth daily.  . furosemide (LASIX) 20 MG tablet Take 1 tablet (20 mg total) by mouth daily.  Marland Kitchen gabapentin (NEURONTIN) 100 MG capsule Take 200 mg by mouth 3 (three) times daily.  . Multiple Vitamins-Minerals (MULTIVITAMIN WITH MINERALS) tablet Take 1 tablet by mouth daily.  Marland Kitchen telmisartan-hydrochlorothiazide (MICARDIS HCT) 80-25 MG tablet Take 1 tablet by mouth daily.     Allergies:   Patient has no known allergies.   Social History   Socioeconomic History  . Marital status: Married  Spouse name: Not on file  . Number of children: Not on file  . Years of education: Not on file  . Highest education level: Not on file  Occupational History  . Not on file  Social Needs  . Financial resource strain: Not on file  . Food insecurity:    Worry: Not on file    Inability: Not on file  . Transportation needs:    Medical: Not on file    Non-medical: Not on file  Tobacco Use  . Smoking status: Never Smoker  . Smokeless tobacco: Never Used  Substance and Sexual Activity  . Alcohol use: No  . Drug use: No  . Sexual activity:  Not on file  Lifestyle  . Physical activity:    Days per week: Not on file    Minutes per session: Not on file  . Stress: Not on file  Relationships  . Social connections:    Talks on phone: Not on file    Gets together: Not on file    Attends religious service: Not on file    Active member of club or organization: Not on file    Attends meetings of clubs or organizations: Not on file    Relationship status: Not on file  Other Topics Concern  . Not on file  Social History Narrative  . Not on file     Family History: The patient's family history is negative for Pulmonary embolism. ROS:   Please see the history of present illness.    All 14 point review of systems negative except as described per history of present illness  EKGs/Labs/Other Studies Reviewed:      Recent Labs: 06/24/2017: ALT 12; B Natriuretic Peptide 342.1; TSH 2.775 12/28/2017: BUN 22; Creatinine, Ser 0.85; Hemoglobin 14.6; NT-Pro BNP 291; Platelets 221; Potassium 5.0; Sodium 141  Recent Lipid Panel No results found for: CHOL, TRIG, HDL, CHOLHDL, VLDL, LDLCALC, LDLDIRECT  Physical Exam:    VS:  BP 126/64   Pulse 81   Ht 5\' 4"  (1.626 m)   Wt 174 lb 12.8 oz (79.3 kg)   SpO2 96%   BMI 30.00 kg/m     Wt Readings from Last 3 Encounters:  06/15/18 174 lb 12.8 oz (79.3 kg)  03/17/18 175 lb (79.4 kg)  02/06/18 173 lb 6.4 oz (78.7 kg)     GEN:  Well nourished, well developed in no acute distress HEENT: Normal NECK: No JVD; No carotid bruits LYMPHATICS: No lymphadenopathy CARDIAC: RRR, no murmurs, no rubs, no gallops RESPIRATORY:  Clear to auscultation without rales, wheezing or rhonchi  ABDOMEN: Soft, non-tender, non-distended MUSCULOSKELETAL:  No edema; No deformity  SKIN: Warm and dry LOWER EXTREMITIES: no swelling NEUROLOGIC:  Alert and oriented x 3 PSYCHIATRIC:  Normal affect   ASSESSMENT:    1. Pacemaker   2. Essential hypertension   3. Heart block AV second degree   4. Pulmonary  hypertension, unspecified (HCC)   5. Other chronic pulmonary embolism without acute cor pulmonale (HCC)    PLAN:    In order of problems listed above:  1. Pacemaker followed by our pacemaker clinic. 2. Essential hypertension doing well from that point review. 3. Heart block addressed with the pacemaker. 4. Pulmonary hypertension: Plan as outlined above. 5. History of pulmonary emboli we will schedule her to have echocardiogram in January.   Medication Adjustments/Labs and Tests Ordered: Current medicines are reviewed at length with the patient today.  Concerns regarding medicines are outlined above.  No orders  of the defined types were placed in this encounter.  Medication changes: No orders of the defined types were placed in this encounter.   Signed, Georgeanna Lea, MD, [facility] 06/15/2018 5:02 PM    [facility]

## 2018-06-16 ENCOUNTER — Telehealth: Payer: Self-pay | Admitting: Emergency Medicine

## 2018-06-16 NOTE — Telephone Encounter (Signed)
Patient informed of echocardiogram appointment 08-30-18 at 10:15 am

## 2018-06-21 ENCOUNTER — Telehealth: Payer: Self-pay

## 2018-06-21 NOTE — Research (Signed)
[place] Sync Informed Consent   Subject Name: Lindsey Ross  Subject met inclusion and exclusion criteria.  The informed consent form, study requirements and expectations were reviewed with the subject and questions and concerns were addressed prior to the signing of the consent form.  The subject verbalized understanding of the trail requirements.  The subject agreed to participate in the [employer] trial and signed the informed consent.  The informed consent was obtained prior to performance of any protocol-specific procedures for the subject.  A copy of the signed informed consent was given to the subject and a copy was placed in the subject's medical record.  Neva Seat 06/21/2018, 11:29 AM

## 2018-06-21 NOTE — Telephone Encounter (Signed)
Called patient to complete annual [employer] survey.  

## 2018-07-10 DIAGNOSIS — I2699 Other pulmonary embolism without acute cor pulmonale: Secondary | ICD-10-CM | POA: Diagnosis not present

## 2018-07-10 DIAGNOSIS — R0602 Shortness of breath: Secondary | ICD-10-CM | POA: Diagnosis not present

## 2018-07-10 DIAGNOSIS — E1151 Type 2 diabetes mellitus with diabetic peripheral angiopathy without gangrene: Secondary | ICD-10-CM | POA: Diagnosis not present

## 2018-07-10 DIAGNOSIS — I272 Pulmonary hypertension, unspecified: Secondary | ICD-10-CM | POA: Diagnosis not present

## 2018-07-10 DIAGNOSIS — Z6831 Body mass index (BMI) 31.0-31.9, adult: Secondary | ICD-10-CM | POA: Diagnosis not present

## 2018-07-10 DIAGNOSIS — Z7901 Long term (current) use of anticoagulants: Secondary | ICD-10-CM | POA: Diagnosis not present

## 2018-07-10 DIAGNOSIS — N183 Chronic kidney disease, stage 3 (moderate): Secondary | ICD-10-CM | POA: Diagnosis not present

## 2018-07-10 DIAGNOSIS — E119 Type 2 diabetes mellitus without complications: Secondary | ICD-10-CM | POA: Insufficient documentation

## 2018-07-10 DIAGNOSIS — I10 Essential (primary) hypertension: Secondary | ICD-10-CM | POA: Diagnosis not present

## 2018-07-11 ENCOUNTER — Ambulatory Visit (INDEPENDENT_AMBULATORY_CARE_PROVIDER_SITE_OTHER): Payer: Medicare Other | Admitting: Cardiology

## 2018-07-11 ENCOUNTER — Encounter: Payer: Self-pay | Admitting: Cardiology

## 2018-07-11 VITALS — BP 122/84 | HR 84 | Ht 64.0 in | Wt 174.0 lb

## 2018-07-11 DIAGNOSIS — I441 Atrioventricular block, second degree: Secondary | ICD-10-CM

## 2018-07-11 DIAGNOSIS — I480 Paroxysmal atrial fibrillation: Secondary | ICD-10-CM

## 2018-07-11 DIAGNOSIS — I10 Essential (primary) hypertension: Secondary | ICD-10-CM | POA: Diagnosis not present

## 2018-07-11 NOTE — Patient Instructions (Signed)
Medication Instructions:  Your physician recommends that you continue on your current medications as directed. Please refer to the Current Medication list given to you today.  *If you need a refill on your cardiac medications before your next appointment, please call your pharmacy*  Labwork: None ordered  Testing/Procedures: None ordered  Follow-Up: Remote monitoring is used to monitor your Pacemaker or ICD from home. This monitoring reduces the number of office visits required to check your device to one time per year. It allows us to keep an eye on the functioning of your device to ensure it is working properly. You are scheduled for a device check from home on 07/13/18. You may send your transmission at any time that day. If you have a wireless device, the transmission will be sent automatically. After your physician reviews your transmission, you will receive a postcard with your next transmission date.  Your physician wants you to follow-up in: 1 year with Dr. [name].  You will receive a reminder letter in the mail two months in advance. If you don't receive a letter, please call our office to schedule the follow-up appointment.  Thank you for choosing [facility]!!   Dory HornSherri Emmanuel Gruenhagen, RN [phone]

## 2018-07-11 NOTE — Progress Notes (Signed)
Electrophysiology Office Note   Date:  07/11/2018   ID:  Lindsey Ross, DOB [birth year], MRN [id number]  PCP:  [name], MD  Cardiologist:  Revankar Primary Electrophysiologist:  [name], MD    No chief complaint on file.    History of Present Illness: Lindsey Ross is a 84 y.o. female who is being seen today for the evaluation of 2:1 AV block at the request of Avva, Ravisankar, MD. Presenting today for electrophysiology evaluation.  She has a history of 2-1 AV block, hypertension, and pulmonary embolism.  She initially presented to the hospital with shortness of breath and was found to have a PE.  She was put on anticoagulation and re-presented for Medtronic dual-chamber pacemaker implant.  Today, denies symptoms of palpitations, chest pain, shortness of breath, orthopnea, PND, lower extremity edema, claudication, dizziness, presyncope, syncope, bleeding, or neurologic sequela. The patient is tolerating medications without difficulties.  Overall she is feeling well.  She does have a few episodes where she gets short of breath and has to sit down.  These have been improving over the last few months.  She says they occur when she has been working hard in the kitchen or climbing stairs.  Otherwise no major complaints.   Past Medical History:  Diagnosis Date  . Acute deep vein thrombosis (DVT) of left tibial vein ([place]) 06/26/2017  . Aortic atherosclerosis ([place]) 06/25/2017  . Arthritis    "right ankle; maybe some in her back" (07/07/2017)  . Burning sensation of feet    Bilateral feet  . DVT (deep venous thrombosis) ([place]) 06/24/2017   LLE  . Frequent urination   . Heart block AV second degree 06/24/2017  . Hypertension   . Presence of permanent cardiac pacemaker   . Pulmonary embolism ([place]) 06/24/2017  . Shortness of breath dyspnea    with exertion   Past Surgical History:  Procedure Laterality Date  . ABDOMINAL HYSTERECTOMY    . ANKLE FRACTURE SURGERY  Right 2003  . CATARACT EXTRACTION W/ INTRAOCULAR LENS  IMPLANT, BILATERAL Bilateral   . FRACTURE SURGERY    . INSERT / REPLACE / REMOVE PACEMAKER  07/07/2017  . JOINT REPLACEMENT    . LAPAROSCOPIC CHOLECYSTECTOMY    . PACEMAKER IMPLANT N/A 07/07/2017   Procedure: PACEMAKER IMPLANT;  Surgeon: Constance Haw, MD;  Location: [place] CV LAB;  Service: Cardiovascular;  Laterality: N/A;  . TOTAL HIP ARTHROPLASTY Right 03/25/2015   Procedure: RIGHT TOTAL HIP ARTHROPLASTY ANTERIOR APPROACH;  Surgeon: Mcarthur Rossetti, MD;  Location: [place];  Service: Orthopedics;  Laterality: Right;     Current Outpatient Medications  Medication Sig Dispense Refill  . acetaminophen (TYLENOL) 500 MG tablet Take 1,000 mg by mouth every 6 (six) hours as needed for headache (pain).    Marland Kitchen albuterol (PROVENTIL HFA;VENTOLIN HFA) 108 (90 Base) MCG/ACT inhaler Inhale 1-2 puffs into the lungs every 6 (six) hours as needed for wheezing or shortness of breath.    Marland Kitchen apixaban (ELIQUIS) 5 MG TABS tablet Take 5 mg by mouth 2 (two) times daily.    . Calcium Carb-Cholecalciferol (CALCIUM + D3 PO) Take 1 tablet by mouth daily.    . furosemide (LASIX) 20 MG tablet Take 1 tablet (20 mg total) by mouth daily. 30 tablet 6  . gabapentin (NEURONTIN) 100 MG capsule Take 200 mg by mouth 3 (three) times daily.    . Multiple Vitamins-Minerals (MULTIVITAMIN WITH MINERALS) tablet Take 1 tablet by mouth daily.    Marland Kitchen  telmisartan-hydrochlorothiazide (MICARDIS HCT) 80-25 MG tablet Take 1 tablet by mouth daily.     No current facility-administered medications for this visit.     Allergies:   Patient has no known allergies.   Social History:  The patient  reports that she has never smoked. She has never used smokeless tobacco. She reports that she does not drink alcohol or use drugs.   Family History:  The patient's family history is not on file.    ROS:  Please see the history of present illness.   Otherwise, review of systems is  positive for SOB.   All other systems are reviewed and negative.   PHYSICAL EXAM: VS:  BP 122/84   Pulse 84   Ht 5' 4"  (1.626 m)   Wt 174 lb (78.9 kg)   SpO2 95%   BMI 29.87 kg/m  , BMI Body mass index is 29.87 kg/m. GEN: Well nourished, well developed, in no acute distress  HEENT: normal  Neck: no JVD, carotid bruits, or masses Cardiac: RRR; no murmurs, rubs, or gallops,no edema  Respiratory:  clear to auscultation bilaterally, normal work of breathing GI: soft, nontender, nondistended, + BS MS: no deformity or atrophy  Skin: warm and dry, device site well healed Neuro:  Strength and sensation are intact Psych: euthymic mood, full affect  EKG:  EKG is not ordered today. Personal review of the ekg ordered 02/06/18 shows SR, V paced  Personal review of the device interrogation today. Results in [place]: 12/28/2017: BUN 22; Creatinine, Ser 0.85; Hemoglobin 14.6; NT-Pro BNP 291; Platelets 221; Potassium 5.0; Sodium 141    Lipid Panel  No results found for: CHOL, TRIG, HDL, CHOLHDL, VLDL, LDLCALC, LDLDIRECT   Wt Readings from Last 3 Encounters:  07/11/18 174 lb (78.9 kg)  06/15/18 174 lb 12.8 oz (79.3 kg)  03/17/18 175 lb (79.4 kg)      Other studies Reviewed: Additional studies/ records that were reviewed today include: TTE 06/26/17  Review of the above records today demonstrates:  - Left ventricle: The cavity size was normal. There was moderate   concentric hypertrophy. Systolic function was normal. The   estimated ejection fraction was in the range of 60% to 65%. Wall   motion was normal; there were no regional wall motion   abnormalities. Doppler parameters are consistent with abnormal   left ventricular relaxation (grade 1 diastolic dysfunction). The   E/e&' ratio is >15, suggesting elevated LV filling pressure. - Aortic valve: Mildly calcified leaflets. There was no stenosis.   There was no significant regurgitation. - Mitral valve: Heavy MAC. Mild  regurgitation. Valve area by   continuity equation (using LVOT flow): 2.08 cm^2. - Left atrium: Severely dilated. - Right ventricle: The cavity size was mildly dilated. Systolic   function was normal. - Right atrium: The atrium was mildly dilated. - Atrial septum: No defect or patent foramen ovale was identified. - Tricuspid valve: There was moderate regurgitation. - Pulmonary arteries: PA peak pressure: 77 mm Hg (S). - Inferior vena cava: The vessel was dilated. The respirophasic   diameter changes were blunted (< 50%), consistent with elevated   central venous pressure.   ASSESSMENT AND PLAN:  1.  2:1 AV block: Medtronic dual-chamber pacemaker implanted 07/07/2017.  Device functioning appropriately.  She is at times under sensing her atrial fibrillation.  We have changed the sensing on her device.  2.  Hypertension: Well-controlled.  No changes.  3.  Paroxysmal atrial fibrillation: Currently on Eliquis.  Not on any antiarrhythmics at this time.  We Jomes Giraldo continue Eliquis without changes.  Minimally symptomatic.  This patients CHA2DS2-VASc Score and unadjusted Ischemic Stroke Rate (% per year) is equal to 4.8 % stroke rate/year from a score of 4  Above score calculated as 1 point each if present [CHF, HTN, DM, Vascular=MI/PAD/Aortic Plaque, Age if 65-74, or Female] Above score calculated as 2 points each if present [Age > 75, or Stroke/TIA/TE]  Current medicines are reviewed at length with the patient today.   The patient does not have concerns regarding her medicines.  The following changes were made today:  none  Labs/ tests ordered today include:  No orders of the defined types were placed in this encounter.    Disposition:   FU with Charlesetta Milliron 12 months  Signed, Yazhini Mcaulay Meredith Leeds, MD  07/11/2018 3:53 PM     [facility] [address] [place] [address] [phone] (office) [phone] (fax)

## 2018-07-12 LAB — CUP PACEART INCLINIC DEVICE CHECK
Battery Remaining Longevity: 129 mo
Brady Statistic AP VS Percent: 0 %
Brady Statistic AS VP Percent: 85.13 %
Brady Statistic AS VS Percent: 0.16 %
Implantable Lead Implant Date: 20181115
Implantable Lead Model: 5076
Implantable Pulse Generator Implant Date: 20181115
Lead Channel Impedance Value: 323 Ohm
Lead Channel Impedance Value: 456 Ohm
Lead Channel Impedance Value: 532 Ohm
Lead Channel Impedance Value: 570 Ohm
Lead Channel Pacing Threshold Amplitude: 0.875 V
Lead Channel Pacing Threshold Pulse Width: 0.4 ms
Lead Channel Sensing Intrinsic Amplitude: 1 mV
Lead Channel Sensing Intrinsic Amplitude: 6.5 mV
Lead Channel Setting Pacing Amplitude: 2 V
Lead Channel Setting Pacing Pulse Width: 0.4 ms
MDC IDC LEAD IMPLANT DT: 20181115
MDC IDC LEAD LOCATION: 753859
MDC IDC LEAD LOCATION: 753860
MDC IDC MSMT BATTERY VOLTAGE: 3.01 V
MDC IDC MSMT LEADCHNL RA PACING THRESHOLD AMPLITUDE: 1 V
MDC IDC MSMT LEADCHNL RA PACING THRESHOLD PULSEWIDTH: 0.4 ms
MDC IDC MSMT LEADCHNL RA SENSING INTR AMPL: 0.75 mV
MDC IDC SESS DTM: 20191119203508
MDC IDC SET LEADCHNL RV PACING AMPLITUDE: 2.5 V
MDC IDC SET LEADCHNL RV SENSING SENSITIVITY: 1.2 mV
MDC IDC STAT BRADY AP VP PERCENT: 14.7 %
MDC IDC STAT BRADY RA PERCENT PACED: 14.67 %
MDC IDC STAT BRADY RV PERCENT PACED: 99.84 %

## 2018-07-13 ENCOUNTER — Ambulatory Visit (INDEPENDENT_AMBULATORY_CARE_PROVIDER_SITE_OTHER): Payer: Medicare Other

## 2018-07-13 DIAGNOSIS — I441 Atrioventricular block, second degree: Secondary | ICD-10-CM | POA: Diagnosis not present

## 2018-07-13 NOTE — Progress Notes (Signed)
Remote pacemaker transmission.   

## 2018-07-14 ENCOUNTER — Encounter: Payer: Self-pay | Admitting: Cardiology

## 2018-07-31 ENCOUNTER — Other Ambulatory Visit: Payer: Self-pay | Admitting: Cardiology

## 2018-08-30 ENCOUNTER — Ambulatory Visit (HOSPITAL_BASED_OUTPATIENT_CLINIC_OR_DEPARTMENT_OTHER)
Admission: RE | Admit: 2018-08-30 | Discharge: 2018-08-30 | Disposition: A | Discharge status: Home / Self Care | Payer: Medicare Other | Source: Ambulatory Visit | Attending: Cardiology | Admitting: Cardiology

## 2018-08-30 DIAGNOSIS — I272 Pulmonary hypertension, unspecified: Secondary | ICD-10-CM

## 2018-08-30 NOTE — Progress Notes (Signed)
  Echocardiogram 2D Echocardiogram has been performed.  Minaal Struckman T Saretta Dahlem 08/30/2018, 10:50 AM

## 2018-09-08 LAB — CUP PACEART REMOTE DEVICE CHECK
Battery Voltage: 3.01 V
Brady Statistic AP VP Percent: 3.99 %
Brady Statistic AS VP Percent: 95.71 %
Brady Statistic AS VS Percent: 0.19 %
Date Time Interrogation Session: 20191121012557
Implantable Lead Implant Date: 20181115
Implantable Lead Location: 753859
Implantable Lead Model: 5076
Lead Channel Impedance Value: 456 Ohm
Lead Channel Impedance Value: 532 Ohm
Lead Channel Impedance Value: 551 Ohm
Lead Channel Pacing Threshold Amplitude: 0.75 V
Lead Channel Pacing Threshold Pulse Width: 0.4 ms
Lead Channel Sensing Intrinsic Amplitude: 0.875 mV
Lead Channel Setting Pacing Amplitude: 2.25 V
Lead Channel Setting Pacing Amplitude: 2.5 V
Lead Channel Setting Pacing Pulse Width: 0.4 ms
Lead Channel Setting Sensing Sensitivity: 1.2 mV
MDC IDC LEAD IMPLANT DT: 20181115
MDC IDC LEAD LOCATION: 753860
MDC IDC MSMT BATTERY REMAINING LONGEVITY: 128 mo
MDC IDC MSMT LEADCHNL RA IMPEDANCE VALUE: 323 Ohm
MDC IDC MSMT LEADCHNL RA PACING THRESHOLD AMPLITUDE: 1.125 V
MDC IDC MSMT LEADCHNL RA PACING THRESHOLD PULSEWIDTH: 0.4 ms
MDC IDC MSMT LEADCHNL RA SENSING INTR AMPL: 0.875 mV
MDC IDC MSMT LEADCHNL RV SENSING INTR AMPL: 6.5 mV
MDC IDC PG IMPLANT DT: 20181115
MDC IDC STAT BRADY AP VS PERCENT: 0 %
MDC IDC STAT BRADY RA PERCENT PACED: 3.37 %
MDC IDC STAT BRADY RV PERCENT PACED: 99.83 %

## 2018-10-09 ENCOUNTER — Ambulatory Visit (INDEPENDENT_AMBULATORY_CARE_PROVIDER_SITE_OTHER): Payer: Medicare Other | Admitting: Cardiology

## 2018-10-09 ENCOUNTER — Encounter: Payer: Self-pay | Admitting: Cardiology

## 2018-10-09 VITALS — BP 110/70 | HR 74 | Ht 64.0 in | Wt 175.8 lb

## 2018-10-09 DIAGNOSIS — I272 Pulmonary hypertension, unspecified: Secondary | ICD-10-CM

## 2018-10-09 DIAGNOSIS — I2782 Chronic pulmonary embolism: Secondary | ICD-10-CM

## 2018-10-09 DIAGNOSIS — Z950 Presence of cardiac pacemaker: Secondary | ICD-10-CM

## 2018-10-09 DIAGNOSIS — I441 Atrioventricular block, second degree: Secondary | ICD-10-CM | POA: Diagnosis not present

## 2018-10-09 DIAGNOSIS — I10 Essential (primary) hypertension: Secondary | ICD-10-CM | POA: Diagnosis not present

## 2018-10-09 NOTE — Progress Notes (Signed)
Cardiology Office Note:    Date:  10/09/2018   ID:  Lindsey Ross, DOB 06-Apr-1925, MRN [id number]  PCP:  Lindsey Greathouse, MD  Cardiologist:  Lindsey Balsam, MD    Referring MD: Lindsey Greathouse, MD   Chief Complaint  Patient presents with  . Follow-up  Doing well  History of Present Illness:    Lindsey Ross is a 85 y.o. female with pulmonary hypertension, status post pulmonary emboli, anticoagulated, pacemaker present comes today to office for follow-up overall she is doing well she is 85 years old but still doing well she said when she walk upstairs she will get short of breath but overall denies have any chest pain tightness squeezing pressure burning chest.  Past Medical History:  Diagnosis Date  . Acute deep vein thrombosis (DVT) of left tibial vein (HCC) 06/26/2017  . Aortic atherosclerosis (HCC) 06/25/2017  . Arthritis    "right ankle; maybe some in her back" (07/07/2017)  . Burning sensation of feet    Bilateral feet  . DVT (deep venous thrombosis) (HCC) 06/24/2017   LLE  . Frequent urination   . Heart block AV second degree 06/24/2017  . Hypertension   . Presence of permanent cardiac pacemaker   . Pulmonary embolism (HCC) 06/24/2017  . Shortness of breath dyspnea    with exertion    Past Surgical History:  Procedure Laterality Date  . ABDOMINAL HYSTERECTOMY    . ANKLE FRACTURE SURGERY Right 2003  . CATARACT EXTRACTION W/ INTRAOCULAR LENS  IMPLANT, BILATERAL Bilateral   . FRACTURE SURGERY    . INSERT / REPLACE / REMOVE PACEMAKER  07/07/2017  . JOINT REPLACEMENT    . LAPAROSCOPIC CHOLECYSTECTOMY    . PACEMAKER IMPLANT N/A 07/07/2017   Procedure: PACEMAKER IMPLANT;  Surgeon: Lindsey Lemming, MD;  Location: [facility];  Service: Cardiovascular;  Laterality: N/A;  . TOTAL HIP ARTHROPLASTY Right 03/25/2015   Procedure: RIGHT TOTAL HIP ARTHROPLASTY ANTERIOR APPROACH;  Surgeon: Lindsey Hitch, MD;  Location: [facility] OR;  Service: Orthopedics;   Laterality: Right;    Current Medications: Current Meds  Medication Sig  . acetaminophen (TYLENOL) 500 MG tablet Take 1,000 mg by mouth every 6 (six) hours as needed for headache (pain).  Marland Kitchen albuterol (PROVENTIL HFA;VENTOLIN HFA) 108 (90 Base) MCG/ACT inhaler Inhale 1-2 puffs into the lungs every 6 (six) hours as needed for wheezing or shortness of breath.  Marland Kitchen apixaban (ELIQUIS) 5 MG TABS tablet Take 5 mg by mouth 2 (two) times daily.  . Calcium Carb-Cholecalciferol (CALCIUM + D3 PO) Take 1 tablet by mouth daily.  . furosemide (LASIX) 20 MG tablet TAKE 1 TABLET(20 MG) BY MOUTH DAILY  . gabapentin (NEURONTIN) 100 MG capsule Take 200 mg by mouth 3 (three) times daily.  . Multiple Vitamins-Minerals (MULTIVITAMIN WITH MINERALS) tablet Take 1 tablet by mouth daily.  Marland Kitchen telmisartan-hydrochlorothiazide (MICARDIS HCT) 80-25 MG tablet Take 1 tablet by mouth daily.     Allergies:   Patient has no known allergies.   Social History   Socioeconomic History  . Marital status: Married    Spouse name: Not on file  . Number of children: Not on file  . Years of education: Not on file  . Highest education level: Not on file  Occupational History  . Not on file  Social Needs  . Financial resource strain: Not on file  . Food insecurity:    Worry: Not on file    Inability: Not on file  . Transportation needs:  Medical: Not on file    Non-medical: Not on file  Tobacco Use  . Smoking status: Never Smoker  . Smokeless tobacco: Never Used  Substance and Sexual Activity  . Alcohol use: No  . Drug use: No  . Sexual activity: Not on file  Lifestyle  . Physical activity:    Days per week: Not on file    Minutes per session: Not on file  . Stress: Not on file  Relationships  . Social connections:    Talks on phone: Not on file    Gets together: Not on file    Attends religious service: Not on file    Active member of club or organization: Not on file    Attends meetings of clubs or  organizations: Not on file    Relationship status: Not on file  Other Topics Concern  . Not on file  Social History Narrative  . Not on file     Family History: The patient's family history is negative for Pulmonary embolism. ROS:   Please see the history of present illness.    All 14 point review of systems negative except as described per history of present illness  EKGs/Labs/Other Studies Reviewed:      Recent Labs: 12/28/2017: BUN 22; Creatinine, Ser 0.85; Hemoglobin 14.6; NT-Pro BNP 291; Platelets 221; Potassium 5.0; Sodium 141  Recent Lipid Panel No results found for: CHOL, TRIG, HDL, CHOLHDL, VLDL, LDLCALC, LDLDIRECT  Physical Exam:    VS:  BP 110/70   Pulse 74   Ht 5\' 4"  (1.626 m)   Wt 175 lb 12.8 oz (79.7 kg)   SpO2 96%   BMI 30.18 kg/m     Wt Readings from Last 3 Encounters:  10/09/18 175 lb 12.8 oz (79.7 kg)  07/11/18 174 lb (78.9 kg)  06/15/18 174 lb 12.8 oz (79.3 kg)     GEN:  Well nourished, well developed in no acute distress HEENT: Normal NECK: No JVD; No carotid bruits LYMPHATICS: No lymphadenopathy CARDIAC: RRR, no murmurs, no rubs, no gallops RESPIRATORY:  Clear to auscultation without rales, wheezing or rhonchi  ABDOMEN: Soft, non-tender, non-distended MUSCULOSKELETAL:  No edema; No deformity  SKIN: Warm and dry LOWER EXTREMITIES: no swelling NEUROLOGIC:  Alert and oriented x 3 PSYCHIATRIC:  Normal affect   ASSESSMENT:    1. Pulmonary hypertension, unspecified (HCC)   2. Other chronic pulmonary embolism without acute cor pulmonale (HCC)   3. Essential hypertension   4. Heart block AV second degree   5. Pacemaker    PLAN:    In order of problems listed above:  1. Pulmonary hypertension last echocardiogram showed pulmonary pressure of 36 mmHg.  She thinks she may be a little more short of breath than previously however overall still functioning quite well.  I do not see any indication to do anything more about her pulmonary hypertension.   Because of her age and comorbidity conservative approach. 2. Essential hypertension blood pressure well controlled continue present management.  3. Pacemaker present: But managed by our EP team. 4. Second 3 heart block.  Best managed by pacemaker.  Overall Lindsey Ross is doing well we will continue present management.   Medication Adjustments/Labs and Tests Ordered: Current medicines are reviewed at length with the patient today.  Concerns regarding medicines are outlined above.  No orders of the defined types were placed in this encounter.  Medication changes: No orders of the defined types were placed in this encounter.   Signed, Georgeanna Lea, MD,  [facility] Re[facility] SurgeonsFACC 10/09/2018 4:16 PM    [facility]

## 2018-10-09 NOTE — Patient Instructions (Signed)

## 2018-10-12 ENCOUNTER — Ambulatory Visit (INDEPENDENT_AMBULATORY_CARE_PROVIDER_SITE_OTHER): Payer: Medicare Other

## 2018-10-12 DIAGNOSIS — I498 Other specified cardiac arrhythmias: Secondary | ICD-10-CM

## 2018-10-12 DIAGNOSIS — I441 Atrioventricular block, second degree: Secondary | ICD-10-CM

## 2018-10-14 LAB — CUP PACEART REMOTE DEVICE CHECK
Brady Statistic AP VP Percent: 17.79 %
Brady Statistic AS VP Percent: 82.07 %
Brady Statistic RA Percent Paced: 17.63 %
Brady Statistic RV Percent Paced: 99.87 %
Implantable Lead Implant Date: 20181115
Implantable Lead Location: 753860
Implantable Lead Model: 5076
Implantable Lead Model: 5076
Lead Channel Impedance Value: 418 Ohm
Lead Channel Impedance Value: 494 Ohm
Lead Channel Pacing Threshold Amplitude: 1.125 V
Lead Channel Pacing Threshold Pulse Width: 0.4 ms
Lead Channel Pacing Threshold Pulse Width: 0.4 ms
Lead Channel Setting Pacing Amplitude: 2.25 V
Lead Channel Setting Sensing Sensitivity: 1.2 mV
MDC IDC LEAD IMPLANT DT: 20181115
MDC IDC LEAD LOCATION: 753859
MDC IDC MSMT BATTERY REMAINING LONGEVITY: 122 mo
MDC IDC MSMT BATTERY VOLTAGE: 2.99 V
MDC IDC MSMT LEADCHNL RA IMPEDANCE VALUE: 323 Ohm
MDC IDC MSMT LEADCHNL RA IMPEDANCE VALUE: 570 Ohm
MDC IDC MSMT LEADCHNL RA SENSING INTR AMPL: 0.875 mV
MDC IDC MSMT LEADCHNL RA SENSING INTR AMPL: 0.875 mV
MDC IDC MSMT LEADCHNL RV PACING THRESHOLD AMPLITUDE: 0.75 V
MDC IDC MSMT LEADCHNL RV SENSING INTR AMPL: 6.5 mV
MDC IDC PG IMPLANT DT: 20181115
MDC IDC SESS DTM: 20200221212628
MDC IDC SET LEADCHNL RV PACING AMPLITUDE: 2.5 V
MDC IDC SET LEADCHNL RV PACING PULSEWIDTH: 0.4 ms
MDC IDC STAT BRADY AP VS PERCENT: 0 %
MDC IDC STAT BRADY AS VS PERCENT: 0.13 %

## 2018-10-19 NOTE — Progress Notes (Signed)
Remote pacemaker transmission.   

## 2018-10-20 ENCOUNTER — Encounter: Payer: Self-pay | Admitting: Cardiology

## 2018-10-28 ENCOUNTER — Other Ambulatory Visit: Payer: Self-pay | Admitting: Cardiology

## 2019-01-10 DIAGNOSIS — E7849 Other hyperlipidemia: Secondary | ICD-10-CM | POA: Diagnosis not present

## 2019-01-10 DIAGNOSIS — N183 Chronic kidney disease, stage 3 (moderate): Secondary | ICD-10-CM | POA: Diagnosis not present

## 2019-01-10 DIAGNOSIS — E1151 Type 2 diabetes mellitus with diabetic peripheral angiopathy without gangrene: Secondary | ICD-10-CM | POA: Diagnosis not present

## 2019-01-10 DIAGNOSIS — M810 Age-related osteoporosis without current pathological fracture: Secondary | ICD-10-CM | POA: Diagnosis not present

## 2019-01-11 ENCOUNTER — Ambulatory Visit (INDEPENDENT_AMBULATORY_CARE_PROVIDER_SITE_OTHER): Payer: Medicare Other | Admitting: *Deleted

## 2019-01-11 DIAGNOSIS — I441 Atrioventricular block, second degree: Secondary | ICD-10-CM | POA: Diagnosis not present

## 2019-01-11 LAB — CUP PACEART REMOTE DEVICE CHECK
Battery Remaining Longevity: 122 mo
Battery Voltage: 2.97 V
Brady Statistic AP VP Percent: 21.55 %
Brady Statistic AP VS Percent: 0 %
Brady Statistic AS VP Percent: 78.43 %
Brady Statistic AS VS Percent: 0.02 %
Brady Statistic RA Percent Paced: 21.26 %
Brady Statistic RV Percent Paced: 99.98 %
Date Time Interrogation Session: 20200521012130
Implantable Lead Implant Date: 20181115
Implantable Lead Implant Date: 20181115
Implantable Lead Location: 753859
Implantable Lead Location: 753860
Implantable Lead Model: 5076
Implantable Lead Model: 5076
Implantable Pulse Generator Implant Date: 20181115
Lead Channel Impedance Value: 323 Ohm
Lead Channel Impedance Value: 456 Ohm
Lead Channel Impedance Value: 513 Ohm
Lead Channel Impedance Value: 532 Ohm
Lead Channel Pacing Threshold Amplitude: 0.875 V
Lead Channel Pacing Threshold Amplitude: 0.875 V
Lead Channel Pacing Threshold Pulse Width: 0.4 ms
Lead Channel Pacing Threshold Pulse Width: 0.4 ms
Lead Channel Sensing Intrinsic Amplitude: 1 mV
Lead Channel Setting Pacing Amplitude: 2 V
Lead Channel Setting Pacing Amplitude: 2.5 V
Lead Channel Setting Pacing Pulse Width: 0.4 ms
Lead Channel Setting Sensing Sensitivity: 1.2 mV

## 2019-01-12 DIAGNOSIS — R82998 Other abnormal findings in urine: Secondary | ICD-10-CM | POA: Diagnosis not present

## 2019-01-12 DIAGNOSIS — I10 Essential (primary) hypertension: Secondary | ICD-10-CM | POA: Diagnosis not present

## 2019-01-17 DIAGNOSIS — M810 Age-related osteoporosis without current pathological fracture: Secondary | ICD-10-CM | POA: Diagnosis not present

## 2019-01-17 DIAGNOSIS — I272 Pulmonary hypertension, unspecified: Secondary | ICD-10-CM | POA: Diagnosis not present

## 2019-01-17 DIAGNOSIS — I441 Atrioventricular block, second degree: Secondary | ICD-10-CM | POA: Diagnosis not present

## 2019-01-17 DIAGNOSIS — E785 Hyperlipidemia, unspecified: Secondary | ICD-10-CM | POA: Diagnosis not present

## 2019-01-17 DIAGNOSIS — Z1331 Encounter for screening for depression: Secondary | ICD-10-CM | POA: Diagnosis not present

## 2019-01-17 DIAGNOSIS — R0602 Shortness of breath: Secondary | ICD-10-CM | POA: Diagnosis not present

## 2019-01-17 DIAGNOSIS — Z7901 Long term (current) use of anticoagulants: Secondary | ICD-10-CM | POA: Diagnosis not present

## 2019-01-17 DIAGNOSIS — I2699 Other pulmonary embolism without acute cor pulmonale: Secondary | ICD-10-CM | POA: Diagnosis not present

## 2019-01-17 DIAGNOSIS — Z0000 Encounter for general adult medical examination without abnormal findings: Secondary | ICD-10-CM | POA: Diagnosis not present

## 2019-01-17 DIAGNOSIS — E1151 Type 2 diabetes mellitus with diabetic peripheral angiopathy without gangrene: Secondary | ICD-10-CM | POA: Diagnosis not present

## 2019-01-17 DIAGNOSIS — I129 Hypertensive chronic kidney disease with stage 1 through stage 4 chronic kidney disease, or unspecified chronic kidney disease: Secondary | ICD-10-CM | POA: Diagnosis not present

## 2019-01-17 DIAGNOSIS — N183 Chronic kidney disease, stage 3 (moderate): Secondary | ICD-10-CM | POA: Diagnosis not present

## 2019-01-19 ENCOUNTER — Encounter: Payer: Self-pay | Admitting: Cardiology

## 2019-01-19 NOTE — Progress Notes (Signed)
Remote pacemaker transmission.   

## 2019-01-30 ENCOUNTER — Other Ambulatory Visit: Payer: Self-pay | Admitting: Cardiology

## 2019-02-23 IMAGING — CT CT ANGIO CHEST
2 of 11 series · 18 of 36 positions shown · IV contrast (isovue)
Comparison: MRI thoracic spine dated December 05, 2016.

ADDENDUM:
Critical Value/emergent results were called by telephone on
06/24/2017 at [DATE] to Dr. MEY TAZYA , who verbally
acknowledged these results.
CLINICAL DATA: Shortness of breath with exertion. Elevated D-dimer.
Evaluate for pulmonary embolism.

EXAM:
CT ANGIOGRAPHY CHEST WITH CONTRAST
TECHNIQUE: Multidetector CT imaging of the chest was performed using the
standard protocol during bolus administration of intravenous
contrast. Multiplanar CT image reconstructions and MIPs were
obtained to evaluate the vascular anatomy.
CONTRAST:  100 cc Isovue 370 intravenous contrast.

[Series 9: pe thins · axial · 0.83mm/px · z∈[+925,+1168]mm · 17 of 275 slices shown]
[im 16/275  lung]
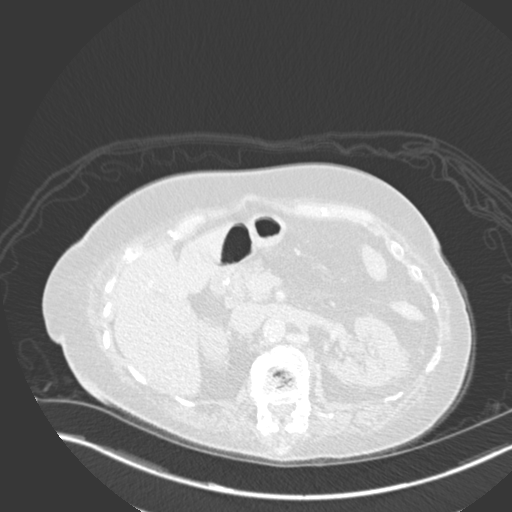
[im 31/275  mediastinal]
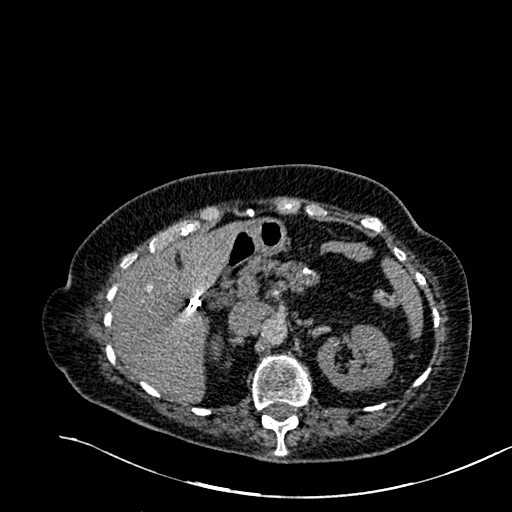
[im 46/275  lung]
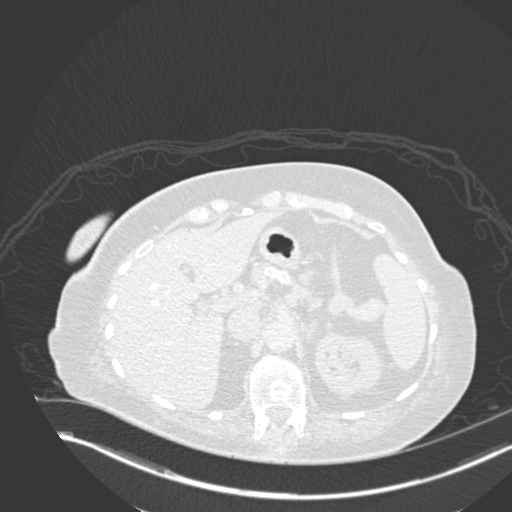
[im 61/275  mediastinal]
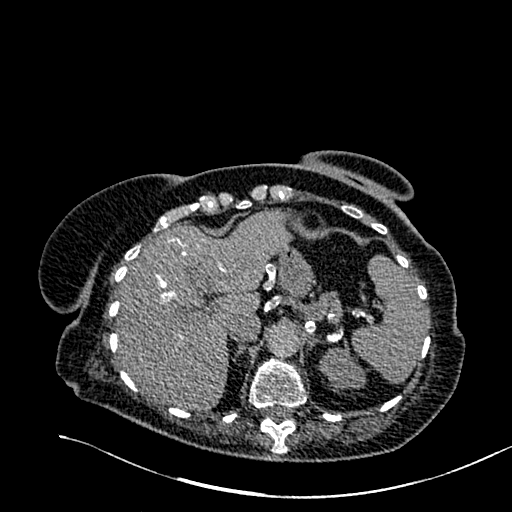
[im 77/275  lung]
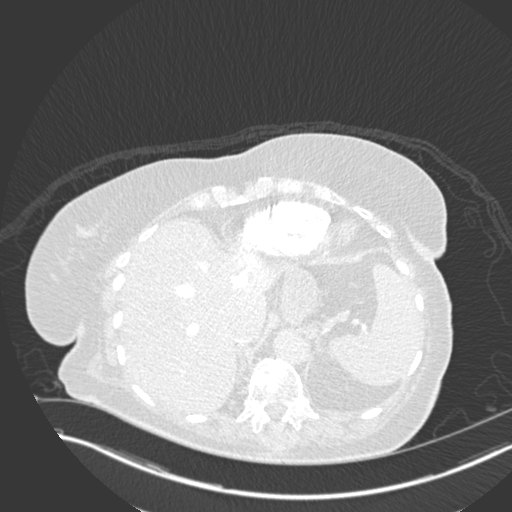
[im 92/275  mediastinal]
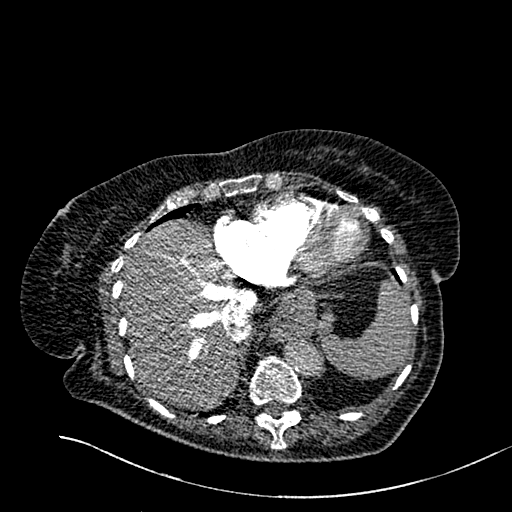
[im 107/275  lung]
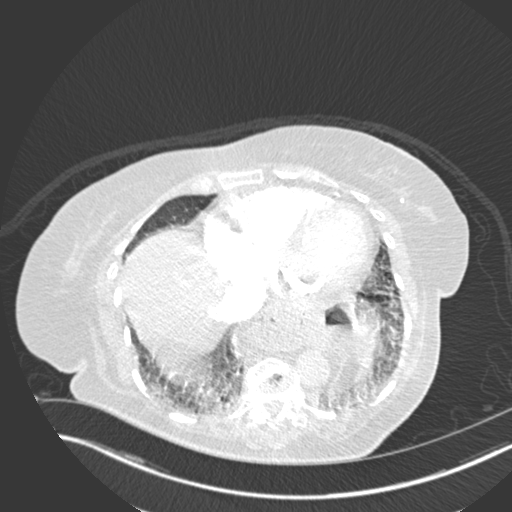
[im 122/275  mediastinal]
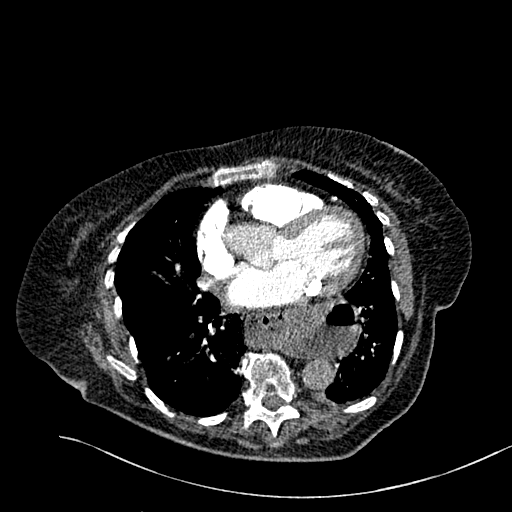
[im 138/275  lung]
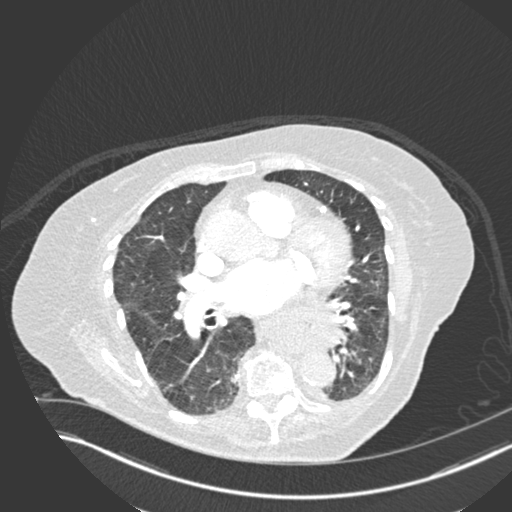
[im 153/275  mediastinal]
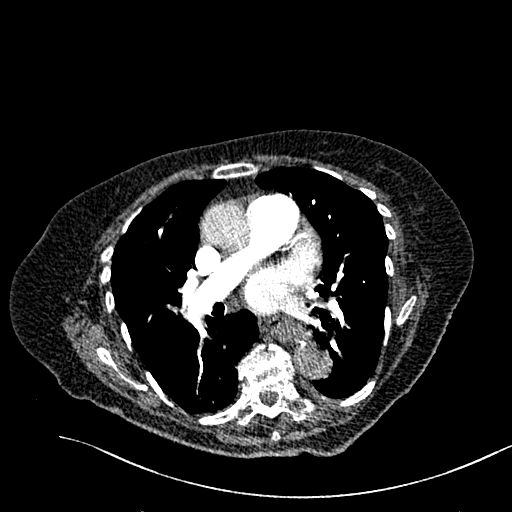
[im 168/275  lung]
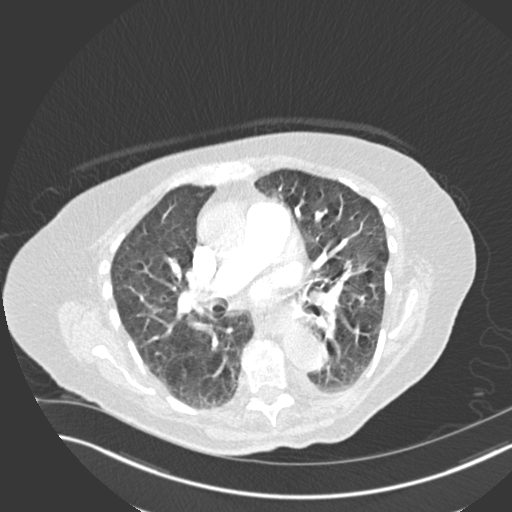
[im 183/275  mediastinal]
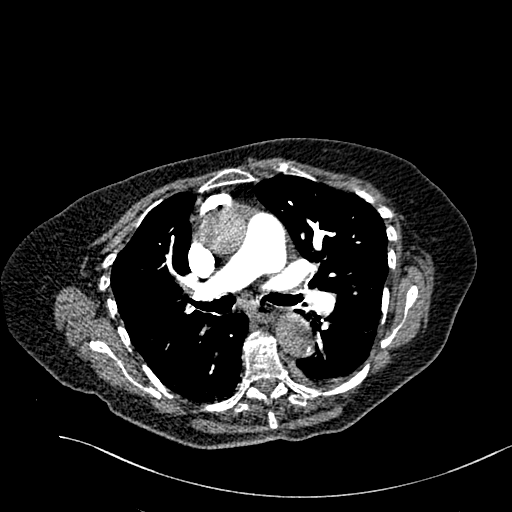
[im 198/275  lung]
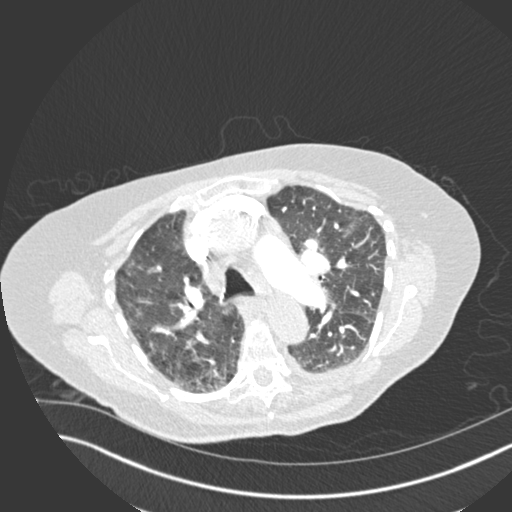
[im 214/275  mediastinal]
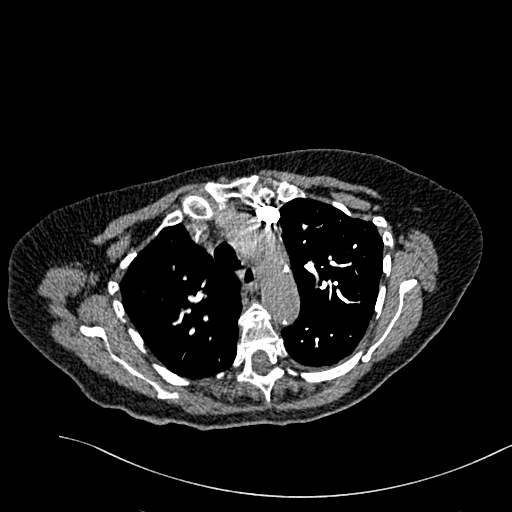
[im 229/275  lung]
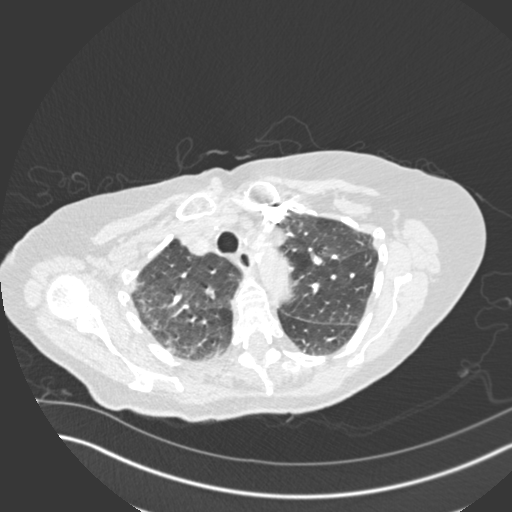
[im 244/275  mediastinal]
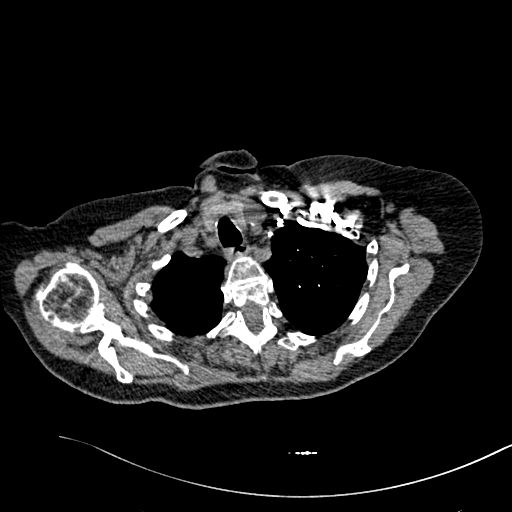
[im 259/275  lung]
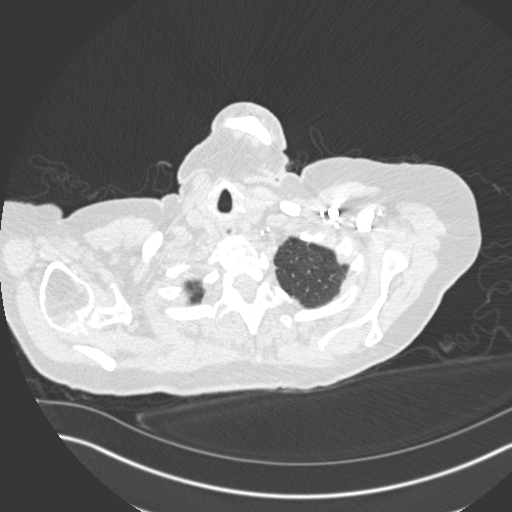

[Series 10: pe coronal mpr · coronal · 0.59mm/px · 1 of 129 slices shown]
[im 65/129  mediastinal]
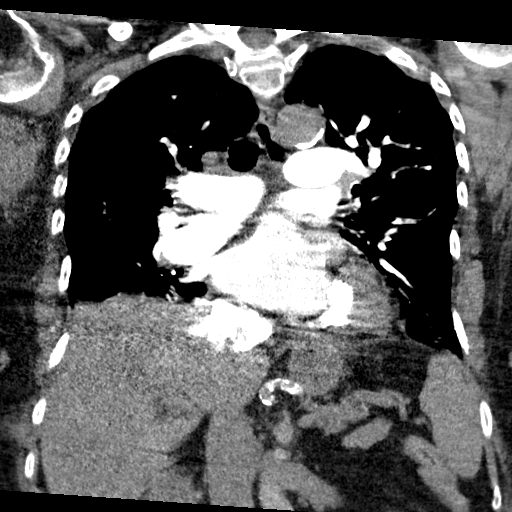

[18 of 36 positions shown; findings below may reference images not displayed]

FINDINGS: Cardiovascular: Acute segmental and subsegmental pulmonary emboli
within the right upper lobe. The lower lobe pulmonary arteries are
not well evaluated due to extensive motion artifact. Evidence of
right heart strain with RV/LV ratio measuring 0.92, and reflux of
contrast into the hepatic veins.

Mild cardiomegaly. No pericardial effusion. Normal caliber thoracic
aorta. Coronary, aortic arch, and branch vessel atherosclerotic
vascular disease. Dense concentric calcification of the mitral
valve.

Mediastinum/Nodes: No enlarged mediastinal, hilar, or axillary lymph
nodes. Thyroid gland, trachea, and esophagus demonstrate no
significant findings.

Lungs/Pleura: Patchy nodularity in the peripheral right upper lobe.
Trace left pleural effusion. Bibasilar subsegmental atelectasis. No
pneumothorax or consolidation. No suspicious pulmonary nodule.

Upper Abdomen: No acute abnormality. Large hiatal hernia. Stable
x 2.6 cm cystic lesion in the pancreatic body.

Musculoskeletal: No chest wall abnormality. No acute or significant
osseous findings. Multiple chronic compression deformities of the
thoracic spine, unchanged when compared to prior thoracic spine MRI.

Review of the MIP images confirms the above findings.
IMPRESSION: 1. Acute segmental and subsegmental pulmonary emboli within the
right upper lobe. The lower lobe pulmonary arteries are not well
evaluated due to extensive motion artifact. There is CT evidence of
right heart strain with RV/LV Ratio of 0.92 and reflux of contrast
into the hepatic veins, consistent with at least submassive
(intermediate risk) PE. The presence of right heart strain has been
associated with an increased risk of morbidity and mortality. Please
activate Code PE by paging [phone].
2. Patchy nodularity in the peripheral right upper lobe may be
infectious or inflammatory. Alternatively, this could represent
early pulmonary infarct.
3.  Aortic atherosclerosis ([id number]-1E4.4).

Radiology assistant personnel have been notified to put me in
telephone contact with the referring physician or the referring
physician's clinical representative in order to discuss these
findings. Once this communication is established I will issue an
addendum to this report for documentation purposes.

## 2019-04-12 ENCOUNTER — Ambulatory Visit (INDEPENDENT_AMBULATORY_CARE_PROVIDER_SITE_OTHER): Payer: Medicare Other | Admitting: *Deleted

## 2019-04-12 DIAGNOSIS — I441 Atrioventricular block, second degree: Secondary | ICD-10-CM | POA: Diagnosis not present

## 2019-04-12 LAB — CUP PACEART REMOTE DEVICE CHECK
Battery Remaining Longevity: 119 mo
Battery Voltage: 2.96 V
Brady Statistic AP VP Percent: 17.15 %
Brady Statistic AP VS Percent: 0 %
Brady Statistic AS VP Percent: 82.82 %
Brady Statistic AS VS Percent: 0.03 %
Brady Statistic RA Percent Paced: 16.99 %
Brady Statistic RV Percent Paced: 99.96 %
Date Time Interrogation Session: 20200820073024
Implantable Lead Implant Date: 20181115
Implantable Lead Implant Date: 20181115
Implantable Lead Location: 753859
Implantable Lead Location: 753860
Implantable Lead Model: 5076
Implantable Lead Model: 5076
Implantable Pulse Generator Implant Date: 20181115
Lead Channel Impedance Value: 323 Ohm
Lead Channel Impedance Value: 456 Ohm
Lead Channel Impedance Value: 532 Ohm
Lead Channel Impedance Value: 570 Ohm
Lead Channel Pacing Threshold Amplitude: 0.875 V
Lead Channel Pacing Threshold Amplitude: 1 V
Lead Channel Pacing Threshold Pulse Width: 0.4 ms
Lead Channel Pacing Threshold Pulse Width: 0.4 ms
Lead Channel Sensing Intrinsic Amplitude: 0.875 mV
Lead Channel Sensing Intrinsic Amplitude: 6.5 mV
Lead Channel Setting Pacing Amplitude: 2 V
Lead Channel Setting Pacing Amplitude: 2.5 V
Lead Channel Setting Pacing Pulse Width: 0.4 ms
Lead Channel Setting Sensing Sensitivity: 1.2 mV

## 2019-04-19 NOTE — Progress Notes (Signed)
Remote pacemaker transmission.   

## 2019-06-08 DIAGNOSIS — Z23 Encounter for immunization: Secondary | ICD-10-CM | POA: Diagnosis not present

## 2019-07-09 ENCOUNTER — Ambulatory Visit (INDEPENDENT_AMBULATORY_CARE_PROVIDER_SITE_OTHER): Payer: Medicare Other | Admitting: Cardiology

## 2019-07-09 ENCOUNTER — Encounter: Payer: Self-pay | Admitting: Cardiology

## 2019-07-09 ENCOUNTER — Other Ambulatory Visit: Payer: Self-pay

## 2019-07-09 ENCOUNTER — Other Ambulatory Visit: Payer: Self-pay | Admitting: Cardiology

## 2019-07-09 VITALS — BP 144/88 | HR 84 | Ht 64.0 in | Wt 180.6 lb

## 2019-07-09 DIAGNOSIS — I441 Atrioventricular block, second degree: Secondary | ICD-10-CM

## 2019-07-09 DIAGNOSIS — I129 Hypertensive chronic kidney disease with stage 1 through stage 4 chronic kidney disease, or unspecified chronic kidney disease: Secondary | ICD-10-CM | POA: Diagnosis not present

## 2019-07-09 DIAGNOSIS — I7090 Unspecified atherosclerosis: Secondary | ICD-10-CM | POA: Diagnosis not present

## 2019-07-09 DIAGNOSIS — I2699 Other pulmonary embolism without acute cor pulmonale: Secondary | ICD-10-CM | POA: Diagnosis not present

## 2019-07-09 DIAGNOSIS — N183 Chronic kidney disease, stage 3 unspecified: Secondary | ICD-10-CM | POA: Diagnosis not present

## 2019-07-09 DIAGNOSIS — E785 Hyperlipidemia, unspecified: Secondary | ICD-10-CM | POA: Diagnosis not present

## 2019-07-09 DIAGNOSIS — Z7901 Long term (current) use of anticoagulants: Secondary | ICD-10-CM | POA: Diagnosis not present

## 2019-07-09 DIAGNOSIS — M5134 Other intervertebral disc degeneration, thoracic region: Secondary | ICD-10-CM | POA: Diagnosis not present

## 2019-07-09 DIAGNOSIS — E1151 Type 2 diabetes mellitus with diabetic peripheral angiopathy without gangrene: Secondary | ICD-10-CM | POA: Diagnosis not present

## 2019-07-09 NOTE — Progress Notes (Signed)
Electrophysiology Office Note   Date:  07/09/2019   ID:  Lindsey Ross, DOB Feb 08, 1925, MRN [id number]  PCP:  [name], MD  Cardiologist:  Revankar Primary Electrophysiologist:  Will Meredith Leeds, MD    No chief complaint on file.    History of Present Illness: Lindsey Ross is a 85 y.o. female who is being seen today for the evaluation of 2:1 AV block at the request of Avva, Ravisankar, MD. Presenting today for electrophysiology evaluation.  She has a history of 2-1 AV block, hypertension, and pulmonary embolism.  She initially presented to the hospital with shortness of breath and was found to have a PE.  She was put on anticoagulation and re-presented for Medtronic dual-chamber pacemaker implant.  Today, denies symptoms of palpitations, chest pain, shortness of breath, orthopnea, PND, lower extremity edema, claudication, dizziness, presyncope, syncope, bleeding, or neurologic sequela. The patient is tolerating medications without difficulties.  Overall she is doing well.  She has been staying at home due to the pandemic.  She has no chest pain or shortness of breath.  She is able to do all of her daily activities.   Past Medical History:  Diagnosis Date  . Acute deep vein thrombosis (DVT) of left tibial vein ([place]) 06/26/2017  . Aortic atherosclerosis ([place]) 06/25/2017  . Arthritis    "right ankle; maybe some in her back" (07/07/2017)  . Burning sensation of feet    Bilateral feet  . DVT (deep venous thrombosis) ([place]) 06/24/2017   LLE  . Frequent urination   . Heart block AV second degree 06/24/2017  . Hypertension   . Presence of permanent cardiac pacemaker   . Pulmonary embolism ([place]) 06/24/2017  . Shortness of breath dyspnea    with exertion   Past Surgical History:  Procedure Laterality Date  . ABDOMINAL HYSTERECTOMY    . ANKLE FRACTURE SURGERY Right 2003  . CATARACT EXTRACTION W/ INTRAOCULAR LENS  IMPLANT, BILATERAL Bilateral   . FRACTURE SURGERY    .  INSERT / REPLACE / REMOVE PACEMAKER  07/07/2017  . JOINT REPLACEMENT    . LAPAROSCOPIC CHOLECYSTECTOMY    . PACEMAKER IMPLANT N/A 07/07/2017   Procedure: PACEMAKER IMPLANT;  Surgeon: Constance Haw, MD;  Location: [facility];  Service: Cardiovascular;  Laterality: N/A;  . TOTAL HIP ARTHROPLASTY Right 03/25/2015   Procedure: RIGHT TOTAL HIP ARTHROPLASTY ANTERIOR APPROACH;  Surgeon: Mcarthur Rossetti, MD;  Location: [place];  Service: Orthopedics;  Laterality: Right;     Current Outpatient Medications  Medication Sig Dispense Refill  . acetaminophen (TYLENOL) 500 MG tablet Take 1,000 mg by mouth every 6 (six) hours as needed for headache (pain).    Marland Kitchen albuterol (PROVENTIL HFA;VENTOLIN HFA) 108 (90 Base) MCG/ACT inhaler Inhale 1-2 puffs into the lungs every 6 (six) hours as needed for wheezing or shortness of breath.    Marland Kitchen apixaban (ELIQUIS) 5 MG TABS tablet Take 5 mg by mouth 2 (two) times daily.    . Calcium Carb-Cholecalciferol (CALCIUM + D3 PO) Take 1 tablet by mouth daily.    . furosemide (LASIX) 20 MG tablet TAKE 1 TABLET(20 MG) BY MOUTH DAILY 90 tablet 0  . gabapentin (NEURONTIN) 100 MG capsule Take 200 mg by mouth 3 (three) times daily.    . Multiple Vitamins-Minerals (MULTIVITAMIN WITH MINERALS) tablet Take 1 tablet by mouth daily.    Marland Kitchen telmisartan-hydrochlorothiazide (MICARDIS HCT) 80-25 MG tablet Take 1 tablet by mouth daily.     No current facility-administered medications  for this visit.     Allergies:   Patient has no known allergies.   Social History:  The patient  reports that she has never smoked. She has never used smokeless tobacco. She reports that she does not drink alcohol or use drugs.   Family History:  The patient's family history is not on file.    ROS:  Please see the history of present illness.   Otherwise, review of systems is positive for none.   All other systems are reviewed and negative.   PHYSICAL EXAM: VS:  BP (!) 144/88   Pulse 84   Ht  _0  (1.626 m)   Wt 180 lb 9.6 oz (81.9 kg)   SpO2 97%   BMI 31.00 kg/m  , BMI Body mass index is 31 kg/m. GEN: Well nourished, well developed, in no acute distress  HEENT: normal  Neck: no JVD, carotid bruits, or masses Cardiac: RRR; no murmurs, rubs, or gallops,no edema  Respiratory:  clear to auscultation bilaterally, normal work of breathing GI: soft, nontender, nondistended, + BS MS: no deformity or atrophy  Skin: warm and dry, device site well healed Neuro:  Strength and sensation are intact Psych: euthymic mood, full affect  EKG:  EKG is ordered today. Personal review of the ekg ordered shows atrial sensed, ventricular paced, rate 84  Personal review of the device interrogation today. Results in [place]: No results found for requested labs within last 8760 hours.    Lipid Panel  No results found for: CHOL, TRIG, HDL, CHOLHDL, VLDL, LDLCALC, LDLDIRECT   Wt Readings from Last 3 Encounters:  07/09/19 180 lb 9.6 oz (81.9 kg)  10/09/18 175 lb 12.8 oz (79.7 kg)  07/11/18 174 lb (78.9 kg)      Other studies Reviewed: Additional studies/ records that were reviewed today include: TTE 06/26/17  Review of the above records today demonstrates:  - Left ventricle: The cavity size was normal. There was moderate   concentric hypertrophy. Systolic function was normal. The   estimated ejection fraction was in the range of 60% to 65%. Wall   motion was normal; there were no regional wall motion   abnormalities. Doppler parameters are consistent with abnormal   left ventricular relaxation (grade 1 diastolic dysfunction). The   E/e&' ratio is >15, suggesting elevated LV filling pressure. - Aortic valve: Mildly calcified leaflets. There was no stenosis.   There was no significant regurgitation. - Mitral valve: Heavy MAC. Mild regurgitation. Valve area by   continuity equation (using LVOT flow): 2.08 cm^2. - Left atrium: Severely dilated. - Right ventricle: The  cavity size was mildly dilated. Systolic   function was normal. - Right atrium: The atrium was mildly dilated. - Atrial septum: No defect or patent foramen ovale was identified. - Tricuspid valve: There was moderate regurgitation. - Pulmonary arteries: PA peak pressure: 77 mm Hg (S). - Inferior vena cava: The vessel was dilated. The respirophasic   diameter changes were blunted (< 50%), consistent with elevated   central venous pressure.   ASSESSMENT AND PLAN:  1.  2:1 AV block: Post Medtronic dual-chamber pacemaker implanted 07/07/2017.  Device functioning appropriately.  No changes.    2.  Hypertension: Evaded today but has been normal on all prior checks.  No changes at this time.  3.  Paroxysmal atrial fibrillation: Currently on Eliquis.  Currently atrial sensed.  This patients CHA2DS2-VASc Score and unadjusted Ischemic Stroke Rate (% per year) is equal to 4.8 %  stroke rate/year from a score of 4  Above score calculated as 1 point each if present [CHF, HTN, DM, Vascular=MI/PAD/Aortic Plaque, Age if 65-74, or Female] Above score calculated as 2 points each if present [Age > 75, or Stroke/TIA/TE]     Current medicines are reviewed at length with the patient today.   The patient does not have concerns regarding her medicines.  The following changes were made today: None  Labs/ tests ordered today include:  Orders Placed This Encounter  Procedures  . EKG 12-Lead     Disposition:   FU with Will Camnitz 12 months  Signed, Will Meredith Leeds, MD  07/09/2019 3:17 PM     [address] [place] [address] [phone] (office) [phone] (fax)

## 2019-07-12 ENCOUNTER — Ambulatory Visit (INDEPENDENT_AMBULATORY_CARE_PROVIDER_SITE_OTHER): Payer: Medicare Other | Admitting: *Deleted

## 2019-07-12 DIAGNOSIS — I441 Atrioventricular block, second degree: Secondary | ICD-10-CM | POA: Diagnosis not present

## 2019-07-12 LAB — CUP PACEART REMOTE DEVICE CHECK
Battery Remaining Longevity: 116 mo
Battery Voltage: 2.95 V
Brady Statistic AP VP Percent: 17.27 %
Brady Statistic AP VS Percent: 0 %
Brady Statistic AS VP Percent: 82.7 %
Brady Statistic AS VS Percent: 0.03 %
Brady Statistic RA Percent Paced: 17.15 %
Brady Statistic RV Percent Paced: 99.97 %
Date Time Interrogation Session: 20201118232539
Implantable Lead Implant Date: 20181115
Implantable Lead Implant Date: 20181115
Implantable Lead Location: 753859
Implantable Lead Location: 753860
Implantable Lead Model: 5076
Implantable Lead Model: 5076
Implantable Pulse Generator Implant Date: 20181115
Lead Channel Impedance Value: 304 Ohm
Lead Channel Impedance Value: 456 Ohm
Lead Channel Impedance Value: 532 Ohm
Lead Channel Impedance Value: 532 Ohm
Lead Channel Pacing Threshold Amplitude: 0.75 V
Lead Channel Pacing Threshold Amplitude: 1 V
Lead Channel Pacing Threshold Pulse Width: 0.4 ms
Lead Channel Pacing Threshold Pulse Width: 0.4 ms
Lead Channel Sensing Intrinsic Amplitude: 0.75 mV
Lead Channel Sensing Intrinsic Amplitude: 0.75 mV
Lead Channel Sensing Intrinsic Amplitude: 6.5 mV
Lead Channel Setting Pacing Amplitude: 2 V
Lead Channel Setting Pacing Amplitude: 2.5 V
Lead Channel Setting Pacing Pulse Width: 0.4 ms
Lead Channel Setting Sensing Sensitivity: 1.2 mV

## 2019-07-26 ENCOUNTER — Ambulatory Visit: Payer: Medicare Other | Admitting: Cardiology

## 2019-08-10 NOTE — Progress Notes (Signed)
Remote pacemaker transmission.   

## 2019-08-22 ENCOUNTER — Encounter: Payer: Self-pay | Admitting: *Deleted

## 2019-08-23 ENCOUNTER — Ambulatory Visit (INDEPENDENT_AMBULATORY_CARE_PROVIDER_SITE_OTHER): Payer: Medicare Other | Admitting: Cardiology

## 2019-08-23 ENCOUNTER — Other Ambulatory Visit: Payer: Self-pay

## 2019-08-23 ENCOUNTER — Encounter: Payer: Self-pay | Admitting: Cardiology

## 2019-08-23 VITALS — BP 130/82 | HR 76 | Ht 64.0 in | Wt 180.1 lb

## 2019-08-23 DIAGNOSIS — Z950 Presence of cardiac pacemaker: Secondary | ICD-10-CM | POA: Diagnosis not present

## 2019-08-23 DIAGNOSIS — I10 Essential (primary) hypertension: Secondary | ICD-10-CM

## 2019-08-23 DIAGNOSIS — I272 Pulmonary hypertension, unspecified: Secondary | ICD-10-CM

## 2019-08-23 DIAGNOSIS — I441 Atrioventricular block, second degree: Secondary | ICD-10-CM

## 2019-08-23 NOTE — Progress Notes (Signed)
Cardiology Office Note:    Date:  08/23/2019   ID:  Lindsey Ross, DOB [birth year], MRN [id number]  PCP:  Chilton Greathouse, MD  Cardiologist:  Gypsy Balsam, MD    Referring MD: Chilton Greathouse, MD   No chief complaint on file. Doing well  History of Present Illness:    Lindsey Ross is a 85 y.o. female with history of pulmonary emboli, also pacemaker secondary to heart block, essential hypertension.  Comes today 2 months for follow-up after admit for somebody who is 85 years old she is doing very well.  Denies having any unusual shortness of breath chest pain tightness squeezing pressure burning chest.  I asked her to come for herself today with herself year ago and she told me she is doing the same.  Past Medical History:  Diagnosis Date  . Acute deep vein thrombosis (DVT) of left tibial vein (HCC) 06/26/2017  . Aortic atherosclerosis (HCC) 06/25/2017  . Arthritis    "right ankle; maybe some in her back" (07/07/2017)  . Bradyarrhythmia 06/23/2017  . Burning sensation of feet    Bilateral feet  . DVT (deep venous thrombosis) (HCC) 06/24/2017   LLE  . Frequent urination   . Heart block AV second degree 06/24/2017  . Hypertension   . Presence of permanent cardiac pacemaker   . Pulmonary embolism (HCC) 06/24/2017  . Shortness of breath dyspnea    with exertion    Past Surgical History:  Procedure Laterality Date  . ABDOMINAL HYSTERECTOMY    . ANKLE FRACTURE SURGERY Right 2003  . CATARACT EXTRACTION W/ INTRAOCULAR LENS  IMPLANT, BILATERAL Bilateral   . FRACTURE SURGERY    . INSERT / REPLACE / REMOVE PACEMAKER  07/07/2017  . JOINT REPLACEMENT    . LAPAROSCOPIC CHOLECYSTECTOMY    . PACEMAKER IMPLANT N/A 07/07/2017   Procedure: PACEMAKER IMPLANT;  Surgeon: Regan Lemming, MD;  Location: [facility];  Service: Cardiovascular;  Laterality: N/A;  . TOTAL HIP ARTHROPLASTY Right 03/25/2015   Procedure: RIGHT TOTAL HIP ARTHROPLASTY ANTERIOR APPROACH;  Surgeon:  Kathryne Hitch, MD;  Location: [facility] OR;  Service: Orthopedics;  Laterality: Right;    Current Medications: Current Meds  Medication Sig  . acetaminophen (TYLENOL) 500 MG tablet Take 1,000 mg by mouth every 6 (six) hours as needed for headache (pain).  Marland Kitchen albuterol (PROVENTIL HFA;VENTOLIN HFA) 108 (90 Base) MCG/ACT inhaler Inhale 1-2 puffs into the lungs every 6 (six) hours as needed for wheezing or shortness of breath.  Marland Kitchen apixaban (ELIQUIS) 5 MG TABS tablet Take 5 mg by mouth 2 (two) times daily.  . Calcium Carb-Cholecalciferol (CALCIUM + D3 PO) Take 1 tablet by mouth daily.  . furosemide (LASIX) 20 MG tablet TAKE 1 TABLET(20 MG) BY MOUTH DAILY  . gabapentin (NEURONTIN) 100 MG capsule Take 200 mg by mouth 3 (three) times daily.  . Multiple Vitamins-Minerals (MULTIVITAMIN WITH MINERALS) tablet Take 1 tablet by mouth daily.  Marland Kitchen telmisartan-hydrochlorothiazide (MICARDIS HCT) 80-25 MG tablet Take 1 tablet by mouth daily.     Allergies:   Patient has no known allergies.   Social History   Socioeconomic History  . Marital status: Married    Spouse name: Not on file  . Number of children: Not on file  . Years of education: Not on file  . Highest education level: Not on file  Occupational History  . Not on file  Tobacco Use  . Smoking status: Never Smoker  . Smokeless tobacco: Never Used  Substance  and Sexual Activity  . Alcohol use: No  . Drug use: No  . Sexual activity: Not on file  Other Topics Concern  . Not on file  Social History Narrative  . Not on file   Social Determinants of Health   Financial Resource Strain:   . Difficulty of Paying Living Expenses: Not on file  Food Insecurity:   . Worried About Charity fundraiser in the Last Year: Not on file  . Ran Out of Food in the Last Year: Not on file  Transportation Needs:   . Lack of Transportation (Medical): Not on file  . Lack of Transportation (Non-Medical): Not on file  Physical Activity:   . Days of Exercise  per Week: Not on file  . Minutes of Exercise per Session: Not on file  Stress:   . Feeling of Stress : Not on file  Social Connections:   . Frequency of Communication with Friends and Family: Not on file  . Frequency of Social Gatherings with Friends and Family: Not on file  . Attends Religious Services: Not on file  . Active Member of Clubs or Organizations: Not on file  . Attends Archivist Meetings: Not on file  . Marital Status: Not on file     Family History: The patient's family history is negative for Pulmonary embolism. ROS:   Please see the history of present illness.    All 14 point review of systems negative except as described per history of present illness  EKGs/Labs/Other Studies Reviewed:      Recent Labs: No results found for requested labs within last 8760 hours.  Recent Lipid Panel No results found for: CHOL, TRIG, HDL, CHOLHDL, VLDL, LDLCALC, LDLDIRECT  Physical Exam:    VS:  BP 130/82   Pulse 76   Ht 5\' 4"  (1.626 m)   Wt 180 lb 1.9 oz (81.7 kg)   SpO2 95%   BMI 30.92 kg/m     Wt Readings from Last 3 Encounters:  08/23/19 180 lb 1.9 oz (81.7 kg)  07/09/19 180 lb 9.6 oz (81.9 kg)  10/09/18 175 lb 12.8 oz (79.7 kg)     GEN:  Well nourished, well developed in no acute distress HEENT: Normal NECK: No JVD; No carotid bruits LYMPHATICS: No lymphadenopathy CARDIAC: RRR, no murmurs, no rubs, no gallops RESPIRATORY:  Clear to auscultation without rales, wheezing or rhonchi  ABDOMEN: Soft, non-tender, non-distended MUSCULOSKELETAL:  No edema; No deformity  SKIN: Warm and dry LOWER EXTREMITIES: no swelling NEUROLOGIC:  Alert and oriented x 3 PSYCHIATRIC:  Normal affect   ASSESSMENT:    1. Heart block AV second degree   2. Essential hypertension   3. Pulmonary hypertension, unspecified ([place])   4. Pacemaker    PLAN:    In order of problems listed above:  1. Heart block that being addressed with a pacemaker followed by our EP team.   Stable on appropriate medications and p.o. Vanco parameters appropriate 2. Essential hypertension blood pressure well controlled continue present management. 3. Pulmonary hypertension.  We will repeat echocardiogram to recheck it 4. Pacemaker followed by our EP team 5. Paroxysmal atrial fibrillation very small burden based on pacemaker interrogation.  She is anticoagulated with Eliquis.  We will continue.  She got 2 indication for Eliquis 1 his paroxysmal atrial fibrillation and to his history of DVT/PE   Medication Adjustments/Labs and Tests Ordered: Current medicines are reviewed at length with the patient today.  Concerns regarding medicines are outlined above.  No orders of the defined types were placed in this encounter.  Medication changes: No orders of the defined types were placed in this encounter.   Signed, Georgeanna Leaobert J. Heavenlee Maiorana, MD, [facility] 08/23/2019 8:53 AM    [facility]

## 2019-08-23 NOTE — Patient Instructions (Signed)
Medication Instructions:  Your physician recommends that you continue on your current medications as directed. Please refer to the Current Medication list given to you today.  *If you need a refill on your cardiac medications before your next appointment, please call your pharmacy*  Lab Work: None ordered  If you have labs (blood work) drawn today and your tests are completely normal, you will receive your results only by: . MyChart Message (if you have MyChart) OR . A paper copy in the mail If you have any lab test that is abnormal or we need to change your treatment, we will call you to review the results.  Testing/Procedures: Your physician has requested that you have an echocardiogram. Echocardiography is a painless test that uses sound waves to create images of your heart. It provides your doctor with information about the size and shape of your heart and how well your heart's chambers and valves are working. This procedure takes approximately one hour. There are no restrictions for this procedure.    Follow-Up: At [facility], you and your health needs are our priority.  As part of our continuing mission to provide you with exceptional heart care, we have created designated Provider Care Teams.  These Care Teams include your primary Cardiologist (physician) and Advanced Practice Providers (APPs -  Physician Assistants and Nurse Practitioners) who all work together to provide you with the care you need, when you need it.  Your next appointment:   6 month(s)  The format for your next appointment:   In Person  Provider:   Robert Krasowski, MD  Other Instructions  Echocardiogram An echocardiogram is a procedure that uses painless sound waves (ultrasound) to produce an image of the heart. Images from an echocardiogram can provide important information about:  Signs of coronary artery disease (CAD).  Aneurysm detection. An aneurysm is a weak or damaged part of an artery wall  that bulges out from the normal force of blood pumping through the body.  Heart size and shape. Changes in the size or shape of the heart can be associated with certain conditions, including heart failure, aneurysm, and CAD.  Heart muscle function.  Heart valve function.  Signs of a past heart attack.  Fluid buildup around the heart.  Thickening of the heart muscle.  A tumor or infectious growth around the heart valves. Tell a health care provider about:  Any allergies you have.  All medicines you are taking, including vitamins, herbs, eye drops, creams, and over-the-counter medicines.  Any blood disorders you have.  Any surgeries you have had.  Any medical conditions you have.  Whether you are pregnant or may be pregnant. What are the risks? Generally, this is a safe procedure. However, problems may occur, including:  Allergic reaction to dye (contrast) that may be used during the procedure. What happens before the procedure? No specific preparation is needed. You may eat and drink normally. What happens during the procedure?   An IV tube may be inserted into one of your veins.  You may receive contrast through this tube. A contrast is an injection that improves the quality of the pictures from your heart.  A gel will be applied to your chest.  A wand-like tool (transducer) will be moved over your chest. The gel will help to transmit the sound waves from the transducer.  The sound waves will harmlessly bounce off of your heart to allow the heart images to be captured in real-time motion. The images will be recorded   on a computer. The procedure may vary among health care providers and hospitals. What happens after the procedure?  You may return to your normal, everyday life, including diet, activities, and medicines, unless your health care provider tells you not to do that. Summary  An echocardiogram is a procedure that uses painless sound waves (ultrasound) to  produce an image of the heart.  Images from an echocardiogram can provide important information about the size and shape of your heart, heart muscle function, heart valve function, and fluid buildup around your heart.  You do not need to do anything to prepare before this procedure. You may eat and drink normally.  After the echocardiogram is completed, you may return to your normal, everyday life, unless your health care provider tells you not to do that. This information is not intended to replace advice given to you by your health care provider. Make sure you discuss any questions you have with your health care provider. Document Revised: 11/30/2018 Document Reviewed: 09/11/2016 Elsevier Patient Education  2020 Elsevier Inc.   

## 2019-08-29 ENCOUNTER — Ambulatory Visit (HOSPITAL_BASED_OUTPATIENT_CLINIC_OR_DEPARTMENT_OTHER): Payer: Medicare Other

## 2019-09-02 ENCOUNTER — Ambulatory Visit: Payer: Medicare Other | Attending: Internal Medicine

## 2019-09-02 ENCOUNTER — Other Ambulatory Visit: Payer: Self-pay

## 2019-09-02 DIAGNOSIS — Z23 Encounter for immunization: Secondary | ICD-10-CM | POA: Diagnosis not present

## 2019-09-02 NOTE — Progress Notes (Signed)
   Covid-19 Vaccination Clinic  Name:  Lindsey Ross    MRN: [id number] DOB: 25-Nov-1924  09/02/2019  Ms. Vent was observed post Covid-19 immunization for 30 minutes based on pre-vaccination screening without incidence. She was provided with Vaccine Information Sheet and instruction to access the V-Safe system.   [name] was instructed to call 911 with any severe reactions post vaccine: Marland Kitchen Difficulty breathing  . Swelling of your face and throat  . A fast heartbeat  . A bad rash all over your body  . Dizziness and weakness    Immunizations Administered    Name Date Dose VIS Date Route   Pfizer COVID-19 Vaccine 09/02/2019 12:20 PM 0.3 mL 08/03/2019 Intramuscular   Manufacturer: [employer], [employer]   Lot: [id number]   NDC: [address]-7

## 2019-09-23 ENCOUNTER — Ambulatory Visit: Payer: Medicare Other | Attending: Internal Medicine

## 2019-09-23 DIAGNOSIS — Z23 Encounter for immunization: Secondary | ICD-10-CM

## 2019-09-23 NOTE — Progress Notes (Signed)
   Covid-19 Vaccination Clinic  Name:  Lindsey Ross    MRN: [id number] DOB: June 21, 1925  09/23/2019  [name] was observed post Covid-19 immunization for 15 minutes without incidence. She was provided with Vaccine Information Sheet and instruction to access the V-Safe system.   [name] was instructed to call 911 with any severe reactions post vaccine: Marland Kitchen Difficulty breathing  . Swelling of your face and throat  . A fast heartbeat  . A bad rash all over your body  . Dizziness and weakness    Immunizations Administered    Name Date Dose VIS Date Route   Pfizer COVID-19 Vaccine 09/23/2019 11:37 AM 0.3 mL 08/03/2019 Intramuscular   Manufacturer: [employer], [employer]   Lot: [id number]   NDC: [address]-8

## 2019-10-10 LAB — CUP PACEART REMOTE DEVICE CHECK
Battery Remaining Longevity: 114 mo
Battery Voltage: 2.94 V
Brady Statistic AP VP Percent: 11.25 %
Brady Statistic AP VS Percent: 0 %
Brady Statistic AS VP Percent: 88.35 %
Brady Statistic AS VS Percent: 0.41 %
Brady Statistic RA Percent Paced: 11.14 %
Brady Statistic RV Percent Paced: 99.59 %
Date Time Interrogation Session: 20210217224750
Implantable Lead Implant Date: 20181115
Implantable Lead Implant Date: 20181115
Implantable Lead Location: 753859
Implantable Lead Location: 753860
Implantable Lead Model: 5076
Implantable Lead Model: 5076
Implantable Pulse Generator Implant Date: 20181115
Lead Channel Impedance Value: 323 Ohm
Lead Channel Impedance Value: 475 Ohm
Lead Channel Impedance Value: 532 Ohm
Lead Channel Impedance Value: 551 Ohm
Lead Channel Pacing Threshold Amplitude: 0.75 V
Lead Channel Pacing Threshold Amplitude: 0.875 V
Lead Channel Pacing Threshold Pulse Width: 0.4 ms
Lead Channel Pacing Threshold Pulse Width: 0.4 ms
Lead Channel Sensing Intrinsic Amplitude: 0.625 mV
Lead Channel Sensing Intrinsic Amplitude: 0.625 mV
Lead Channel Sensing Intrinsic Amplitude: 6.5 mV
Lead Channel Setting Pacing Amplitude: 2 V
Lead Channel Setting Pacing Amplitude: 2.5 V
Lead Channel Setting Pacing Pulse Width: 0.4 ms
Lead Channel Setting Sensing Sensitivity: 1.2 mV

## 2019-10-11 ENCOUNTER — Ambulatory Visit (INDEPENDENT_AMBULATORY_CARE_PROVIDER_SITE_OTHER): Payer: Medicare Other | Admitting: *Deleted

## 2019-10-11 DIAGNOSIS — I441 Atrioventricular block, second degree: Secondary | ICD-10-CM

## 2019-10-11 NOTE — Progress Notes (Signed)
PPM Remote  

## 2019-10-22 ENCOUNTER — Other Ambulatory Visit: Payer: Self-pay | Admitting: Family

## 2019-11-20 DIAGNOSIS — H5010 Unspecified exotropia: Secondary | ICD-10-CM | POA: Diagnosis not present

## 2019-11-20 DIAGNOSIS — H52203 Unspecified astigmatism, bilateral: Secondary | ICD-10-CM | POA: Diagnosis not present

## 2019-11-20 DIAGNOSIS — H353131 Nonexudative age-related macular degeneration, bilateral, early dry stage: Secondary | ICD-10-CM | POA: Diagnosis not present

## 2019-11-20 DIAGNOSIS — Z961 Presence of intraocular lens: Secondary | ICD-10-CM | POA: Diagnosis not present

## 2020-01-10 ENCOUNTER — Ambulatory Visit (INDEPENDENT_AMBULATORY_CARE_PROVIDER_SITE_OTHER): Payer: Medicare Other | Admitting: *Deleted

## 2020-01-10 DIAGNOSIS — I441 Atrioventricular block, second degree: Secondary | ICD-10-CM

## 2020-01-10 LAB — CUP PACEART REMOTE DEVICE CHECK
Battery Remaining Longevity: 108 mo
Battery Voltage: 2.93 V
Brady Statistic AP VP Percent: 43.08 %
Brady Statistic AP VS Percent: 0 %
Brady Statistic AS VP Percent: 56.88 %
Brady Statistic AS VS Percent: 0.04 %
Brady Statistic RA Percent Paced: 42.13 %
Brady Statistic RV Percent Paced: 99.96 %
Date Time Interrogation Session: 20210519235058
Implantable Lead Implant Date: 20181115
Implantable Lead Implant Date: 20181115
Implantable Lead Location: 753859
Implantable Lead Location: 753860
Implantable Lead Model: 5076
Implantable Lead Model: 5076
Implantable Pulse Generator Implant Date: 20181115
Lead Channel Impedance Value: 304 Ohm
Lead Channel Impedance Value: 437 Ohm
Lead Channel Impedance Value: 513 Ohm
Lead Channel Impedance Value: 513 Ohm
Lead Channel Pacing Threshold Amplitude: 1 V
Lead Channel Pacing Threshold Amplitude: 1.125 V
Lead Channel Pacing Threshold Pulse Width: 0.4 ms
Lead Channel Pacing Threshold Pulse Width: 0.4 ms
Lead Channel Sensing Intrinsic Amplitude: 1.125 mV
Lead Channel Sensing Intrinsic Amplitude: 1.125 mV
Lead Channel Sensing Intrinsic Amplitude: 6.5 mV
Lead Channel Setting Pacing Amplitude: 2.25 V
Lead Channel Setting Pacing Amplitude: 2.5 V
Lead Channel Setting Pacing Pulse Width: 0.4 ms
Lead Channel Setting Sensing Sensitivity: 1.2 mV

## 2020-01-14 NOTE — Progress Notes (Signed)
Remote pacemaker transmission.   

## 2020-01-17 DIAGNOSIS — E1151 Type 2 diabetes mellitus with diabetic peripheral angiopathy without gangrene: Secondary | ICD-10-CM | POA: Diagnosis not present

## 2020-01-17 DIAGNOSIS — M810 Age-related osteoporosis without current pathological fracture: Secondary | ICD-10-CM | POA: Diagnosis not present

## 2020-01-17 DIAGNOSIS — E7849 Other hyperlipidemia: Secondary | ICD-10-CM | POA: Diagnosis not present

## 2020-01-24 DIAGNOSIS — R1310 Dysphagia, unspecified: Secondary | ICD-10-CM | POA: Diagnosis not present

## 2020-01-24 DIAGNOSIS — M810 Age-related osteoporosis without current pathological fracture: Secondary | ICD-10-CM | POA: Diagnosis not present

## 2020-01-24 DIAGNOSIS — N1831 Chronic kidney disease, stage 3a: Secondary | ICD-10-CM | POA: Insufficient documentation

## 2020-01-24 DIAGNOSIS — I2699 Other pulmonary embolism without acute cor pulmonale: Secondary | ICD-10-CM | POA: Diagnosis not present

## 2020-01-24 DIAGNOSIS — R82998 Other abnormal findings in urine: Secondary | ICD-10-CM | POA: Diagnosis not present

## 2020-01-24 DIAGNOSIS — I272 Pulmonary hypertension, unspecified: Secondary | ICD-10-CM | POA: Diagnosis not present

## 2020-01-24 DIAGNOSIS — Z1331 Encounter for screening for depression: Secondary | ICD-10-CM | POA: Diagnosis not present

## 2020-01-24 DIAGNOSIS — E7849 Other hyperlipidemia: Secondary | ICD-10-CM | POA: Diagnosis not present

## 2020-01-24 DIAGNOSIS — Z7901 Long term (current) use of anticoagulants: Secondary | ICD-10-CM | POA: Diagnosis not present

## 2020-01-24 DIAGNOSIS — Z0000 Encounter for general adult medical examination without abnormal findings: Secondary | ICD-10-CM | POA: Diagnosis not present

## 2020-01-24 DIAGNOSIS — I129 Hypertensive chronic kidney disease with stage 1 through stage 4 chronic kidney disease, or unspecified chronic kidney disease: Secondary | ICD-10-CM | POA: Diagnosis not present

## 2020-01-24 DIAGNOSIS — E1151 Type 2 diabetes mellitus with diabetic peripheral angiopathy without gangrene: Secondary | ICD-10-CM | POA: Diagnosis not present

## 2020-01-28 ENCOUNTER — Ambulatory Visit (HOSPITAL_BASED_OUTPATIENT_CLINIC_OR_DEPARTMENT_OTHER)
Admission: RE | Admit: 2020-01-28 | Discharge: 2020-01-28 | Disposition: A | Discharge status: Home / Self Care | Payer: Medicare Other | Source: Ambulatory Visit | Attending: Cardiology | Admitting: Cardiology

## 2020-01-28 DIAGNOSIS — I272 Pulmonary hypertension, unspecified: Secondary | ICD-10-CM

## 2020-01-28 NOTE — Progress Notes (Signed)
  Echocardiogram 2D Echocardiogram has been performed.  Lindsey Ross 01/28/2020, 3:04 PM

## 2020-01-29 ENCOUNTER — Telehealth: Payer: Self-pay

## 2020-01-29 NOTE — Telephone Encounter (Signed)
The patient has been notified of the Echo result and verbalized understanding.  All questions (if any) were answered. Sigurd Sos, RN 01/29/2020 4:29 PM

## 2020-01-29 NOTE — Telephone Encounter (Signed)
-----   Message from Georgeanna Lea, MD sent at 01/29/2020 10:14 AM EDT ----- Echo looks good, normal ef,mild mr, aortae enlarged, medical therapy

## 2020-02-08 ENCOUNTER — Other Ambulatory Visit: Payer: Self-pay

## 2020-02-08 ENCOUNTER — Encounter: Payer: Self-pay | Admitting: Cardiology

## 2020-02-08 ENCOUNTER — Telehealth: Payer: Self-pay | Admitting: Cardiology

## 2020-02-08 ENCOUNTER — Ambulatory Visit (INDEPENDENT_AMBULATORY_CARE_PROVIDER_SITE_OTHER): Payer: Medicare Other | Admitting: Cardiology

## 2020-02-08 VITALS — BP 148/78 | HR 60 | Ht 64.0 in | Wt 176.0 lb

## 2020-02-08 DIAGNOSIS — I2782 Chronic pulmonary embolism: Secondary | ICD-10-CM | POA: Diagnosis not present

## 2020-02-08 DIAGNOSIS — R0602 Shortness of breath: Secondary | ICD-10-CM

## 2020-02-08 DIAGNOSIS — Z950 Presence of cardiac pacemaker: Secondary | ICD-10-CM

## 2020-02-08 DIAGNOSIS — I272 Pulmonary hypertension, unspecified: Secondary | ICD-10-CM | POA: Diagnosis not present

## 2020-02-08 DIAGNOSIS — I10 Essential (primary) hypertension: Secondary | ICD-10-CM

## 2020-02-08 NOTE — Telephone Encounter (Signed)
Okey Regal the pt's daughter is calling requesting to attend Lindsey Ross's upcoming appointment scheduled for today due to her not being steady on her feet or hearing well.

## 2020-02-08 NOTE — Progress Notes (Signed)
Cardiology Office Note:    Date:  02/08/2020   ID:  [name], DOB March 21, 1925, MRN [id number]  PCP:  [name], MD  Cardiologist:  Jenne Campus, MD    Referring MD: [name], MD   No chief complaint on file. Am doing well  History of Present Illness:    Lindsey Ross is a 86 y.o. female with past medical history significant for pulmonary emboli status post pacemaker secondary to heart block, essential hypertension, pulmonary hypertension with latest measurement of 50 mmHg.  She comes today 2 months of follow-up and again for somebody with negative result she is doing amazingly well.  Denies have any chest pain tightness squeezing pressure burning chest.  She does have some shortness of breath with mild.  She is taking care of her elderly husband who is 62 years old.  Past Medical History:  Diagnosis Date   Acute deep vein thrombosis (DVT) of left tibial vein (HCC) 06/26/2017   Aortic atherosclerosis ([place]) 06/25/2017   Arthritis    "right ankle; maybe some in her back" (07/07/2017)   Bradyarrhythmia 06/23/2017   Burning sensation of feet    Bilateral feet   DVT (deep venous thrombosis) ([place]) 06/24/2017   LLE   Frequent urination    Heart block AV second degree 06/24/2017   Hypertension    Presence of permanent cardiac pacemaker    Pulmonary embolism ([place]) 06/24/2017   Shortness of breath dyspnea    with exertion    Past Surgical History:  Procedure Laterality Date   ABDOMINAL HYSTERECTOMY     ANKLE FRACTURE SURGERY Right 2003   CATARACT EXTRACTION W/ INTRAOCULAR LENS  IMPLANT, BILATERAL Bilateral    FRACTURE SURGERY     INSERT / REPLACE / REMOVE PACEMAKER  07/07/2017   JOINT REPLACEMENT     LAPAROSCOPIC CHOLECYSTECTOMY     PACEMAKER IMPLANT N/A 07/07/2017   Procedure: PACEMAKER IMPLANT;  Surgeon: Constance Haw, MD;  Location: [facility];  Service: Cardiovascular;  Laterality: N/A;   TOTAL HIP ARTHROPLASTY  Right 03/25/2015   Procedure: RIGHT TOTAL HIP ARTHROPLASTY ANTERIOR APPROACH;  Surgeon: Mcarthur Rossetti, MD;  Location: [facility];  Service: Orthopedics;  Laterality: Right;    Current Medications: Current Meds  Medication Sig   acetaminophen (TYLENOL) 500 MG tablet Take 1,000 mg by mouth every 6 (six) hours as needed for headache (pain).   albuterol (PROVENTIL HFA;VENTOLIN HFA) 108 (90 Base) MCG/ACT inhaler Inhale 1-2 puffs into the lungs every 6 (six) hours as needed for wheezing or shortness of breath.   apixaban (ELIQUIS) 5 MG TABS tablet Take 5 mg by mouth 2 (two) times daily.   Calcium Carb-Cholecalciferol (CALCIUM + D3 PO) Take 1 tablet by mouth daily.   furosemide (LASIX) 20 MG tablet TAKE 1 TABLET(20 MG) BY MOUTH DAILY   gabapentin (NEURONTIN) 100 MG capsule Take 200 mg by mouth 3 (three) times daily.   Multiple Vitamins-Minerals (MULTIVITAMIN WITH MINERALS) tablet Take 1 tablet by mouth daily.   telmisartan-hydrochlorothiazide (MICARDIS HCT) 80-25 MG tablet Take 1 tablet by mouth daily.     Allergies:   Patient has no known allergies.   Social History   Socioeconomic History   Marital status: Married    Spouse name: Not on file   Number of children: Not on file   Years of education: Not on file   Highest education level: Not on file  Occupational History   Not on file  Tobacco Use   Smoking status:  Never Smoker   Smokeless tobacco: Never Used  Vaping Use   Vaping Use: Never used  Substance and Sexual Activity   Alcohol use: No   Drug use: No   Sexual activity: Not on file  Other Topics Concern   Not on file  Social History Narrative   Not on file   Social Determinants of Health   Financial Resource Strain:    Difficulty of Paying Living Expenses:   Food Insecurity:    Worried About Programme researcher, broadcasting/film/video in the Last Year:    Barista in the Last Year:   Transportation Needs:    Freight forwarder (Medical):    Lack of  Transportation (Non-Medical):   Physical Activity:    Days of Exercise per Week:    Minutes of Exercise per Session:   Stress:    Feeling of Stress :   Social Connections:    Frequency of Communication with Friends and Family:    Frequency of Social Gatherings with Friends and Family:    Attends Religious Services:    Active Member of Clubs or Organizations:    Attends Banker Meetings:    Marital Status:      Family History: The patient's family history is negative for Pulmonary embolism. ROS:   Please see the history of present illness.    All 14 point review of systems negative except as described per history of present illness  EKGs/Labs/Other Studies Reviewed:      Recent Labs: No results found for requested labs within last 8760 hours.  Recent Lipid Panel No results found for: CHOL, TRIG, HDL, CHOLHDL, VLDL, LDLCALC, LDLDIRECT  Physical Exam:    VS:  BP (!) 148/78    Pulse 60    Ht 5\' 4"  (1.626 m)    Wt 176 lb (79.8 kg)    SpO2 91%    BMI 30.21 kg/m     Wt Readings from Last 3 Encounters:  02/08/20 176 lb (79.8 kg)  08/23/19 180 lb 1.9 oz (81.7 kg)  07/09/19 180 lb 9.6 oz (81.9 kg)     GEN:  Well nourished, well developed in no acute distress HEENT: Normal NECK: No JVD; No carotid bruits LYMPHATICS: No lymphadenopathy CARDIAC: RRR, no murmurs, no rubs, no gallops RESPIRATORY:  Clear to auscultation without rales, wheezing or rhonchi  ABDOMEN: Soft, non-tender, non-distended MUSCULOSKELETAL:  No edema; No deformity  SKIN: Warm and dry LOWER EXTREMITIES: no swelling NEUROLOGIC:  Alert and oriented x 3 PSYCHIATRIC:  Normal affect   ASSESSMENT:    1. Essential hypertension   2. Other chronic pulmonary embolism without acute cor pulmonale (HCC)   3. Pulmonary hypertension, unspecified (HCC)   4. Pacemaker   5. SOB (shortness of breath) on exertion    PLAN:    In order of problems listed above:  1. Essential hypertension blood  pressure well controlled continue present management. 2. History of pulmonary emboli pulmonary pressure on the echocardiogram 50 mmHg, she does have swelling of lower extremities she does not have any palpitations she does have mild shortness of breath but nothing changed within the last year or so, therefore, we will monitor the situation. 3. Pacemaker present, followed by our EP team stable.  I did review her interrogation battery is good.   4. Shortness of breath mild unchanged probably multifactorial.  She does not want to use any aggressive measures. 5. We did review echocardiogram to get a I explained to her all statements  written there.   Medication Adjustments/Labs and Tests Ordered: Current medicines are reviewed at length with the patient today.  Concerns regarding medicines are outlined above.  No orders of the defined types were placed in this encounter.  Medication changes: No orders of the defined types were placed in this encounter.   Signed, Georgeanna Lea, MD, [facility] 02/08/2020 4:21 PM    [facility]

## 2020-02-08 NOTE — Patient Instructions (Signed)

## 2020-02-08 NOTE — Telephone Encounter (Signed)
Called spoke to patient's daughter informed her that she can accompany her mother to her appointment today. She verbally understood no further questions.

## 2020-03-10 DIAGNOSIS — Z9181 History of falling: Secondary | ICD-10-CM | POA: Diagnosis not present

## 2020-03-10 DIAGNOSIS — R2681 Unsteadiness on feet: Secondary | ICD-10-CM | POA: Diagnosis not present

## 2020-03-10 DIAGNOSIS — M6281 Muscle weakness (generalized): Secondary | ICD-10-CM | POA: Diagnosis not present

## 2020-03-12 DIAGNOSIS — M6281 Muscle weakness (generalized): Secondary | ICD-10-CM | POA: Diagnosis not present

## 2020-03-12 DIAGNOSIS — Z9181 History of falling: Secondary | ICD-10-CM | POA: Diagnosis not present

## 2020-03-12 DIAGNOSIS — R2681 Unsteadiness on feet: Secondary | ICD-10-CM | POA: Diagnosis not present

## 2020-04-10 ENCOUNTER — Ambulatory Visit (INDEPENDENT_AMBULATORY_CARE_PROVIDER_SITE_OTHER): Payer: Medicare Other | Admitting: *Deleted

## 2020-04-10 DIAGNOSIS — I441 Atrioventricular block, second degree: Secondary | ICD-10-CM

## 2020-04-10 LAB — CUP PACEART REMOTE DEVICE CHECK
Battery Remaining Longevity: 105 mo
Battery Voltage: 2.93 V
Brady Statistic AP VP Percent: 50.47 %
Brady Statistic AP VS Percent: 0 %
Brady Statistic AS VP Percent: 49.52 %
Brady Statistic AS VS Percent: 0.01 %
Brady Statistic RA Percent Paced: 49.57 %
Brady Statistic RV Percent Paced: 99.99 %
Date Time Interrogation Session: 20210819000354
Implantable Lead Implant Date: 20181115
Implantable Lead Implant Date: 20181115
Implantable Lead Location: 753859
Implantable Lead Location: 753860
Implantable Lead Model: 5076
Implantable Lead Model: 5076
Implantable Pulse Generator Implant Date: 20181115
Lead Channel Impedance Value: 323 Ohm
Lead Channel Impedance Value: 437 Ohm
Lead Channel Impedance Value: 494 Ohm
Lead Channel Impedance Value: 551 Ohm
Lead Channel Pacing Threshold Amplitude: 0.75 V
Lead Channel Pacing Threshold Amplitude: 0.875 V
Lead Channel Pacing Threshold Pulse Width: 0.4 ms
Lead Channel Pacing Threshold Pulse Width: 0.4 ms
Lead Channel Sensing Intrinsic Amplitude: 0.5 mV
Lead Channel Sensing Intrinsic Amplitude: 0.5 mV
Lead Channel Sensing Intrinsic Amplitude: 6.5 mV
Lead Channel Setting Pacing Amplitude: 2 V
Lead Channel Setting Pacing Amplitude: 2.5 V
Lead Channel Setting Pacing Pulse Width: 0.4 ms
Lead Channel Setting Sensing Sensitivity: 1.2 mV

## 2020-04-11 NOTE — Progress Notes (Signed)
Remote pacemaker transmission.   

## 2020-05-25 DIAGNOSIS — Z23 Encounter for immunization: Secondary | ICD-10-CM | POA: Diagnosis not present

## 2020-06-05 DIAGNOSIS — Z23 Encounter for immunization: Secondary | ICD-10-CM | POA: Diagnosis not present

## 2020-07-10 ENCOUNTER — Ambulatory Visit (INDEPENDENT_AMBULATORY_CARE_PROVIDER_SITE_OTHER): Payer: Medicare Other

## 2020-07-10 DIAGNOSIS — I441 Atrioventricular block, second degree: Secondary | ICD-10-CM

## 2020-07-10 LAB — CUP PACEART REMOTE DEVICE CHECK
Battery Remaining Longevity: 100 mo
Battery Voltage: 2.92 V
Brady Statistic AP VP Percent: 30.66 %
Brady Statistic AP VS Percent: 0 %
Brady Statistic AS VP Percent: 69.32 %
Brady Statistic AS VS Percent: 0.01 %
Brady Statistic RA Percent Paced: 29.79 %
Brady Statistic RV Percent Paced: 99.99 %
Date Time Interrogation Session: 20211118014245
Implantable Lead Implant Date: 20181115
Implantable Lead Implant Date: 20181115
Implantable Lead Location: 753859
Implantable Lead Location: 753860
Implantable Lead Model: 5076
Implantable Lead Model: 5076
Implantable Pulse Generator Implant Date: 20181115
Lead Channel Impedance Value: 323 Ohm
Lead Channel Impedance Value: 399 Ohm
Lead Channel Impedance Value: 437 Ohm
Lead Channel Impedance Value: 475 Ohm
Lead Channel Pacing Threshold Amplitude: 0.625 V
Lead Channel Pacing Threshold Amplitude: 0.875 V
Lead Channel Pacing Threshold Pulse Width: 0.4 ms
Lead Channel Pacing Threshold Pulse Width: 0.4 ms
Lead Channel Sensing Intrinsic Amplitude: 0.625 mV
Lead Channel Sensing Intrinsic Amplitude: 0.625 mV
Lead Channel Sensing Intrinsic Amplitude: 6.5 mV
Lead Channel Setting Pacing Amplitude: 2 V
Lead Channel Setting Pacing Amplitude: 2.5 V
Lead Channel Setting Pacing Pulse Width: 0.4 ms
Lead Channel Setting Sensing Sensitivity: 1.2 mV

## 2020-07-11 NOTE — Progress Notes (Signed)
Remote pacemaker transmission.   

## 2020-08-05 DIAGNOSIS — M1990 Unspecified osteoarthritis, unspecified site: Secondary | ICD-10-CM | POA: Insufficient documentation

## 2020-08-05 DIAGNOSIS — R350 Frequency of micturition: Secondary | ICD-10-CM | POA: Insufficient documentation

## 2020-08-05 DIAGNOSIS — Z950 Presence of cardiac pacemaker: Secondary | ICD-10-CM | POA: Insufficient documentation

## 2020-08-05 DIAGNOSIS — R208 Other disturbances of skin sensation: Secondary | ICD-10-CM | POA: Insufficient documentation

## 2020-08-05 DIAGNOSIS — I10 Essential (primary) hypertension: Secondary | ICD-10-CM | POA: Insufficient documentation

## 2020-08-06 ENCOUNTER — Encounter: Payer: Self-pay | Admitting: Cardiology

## 2020-08-06 ENCOUNTER — Other Ambulatory Visit: Payer: Self-pay

## 2020-08-06 ENCOUNTER — Ambulatory Visit (INDEPENDENT_AMBULATORY_CARE_PROVIDER_SITE_OTHER): Payer: Medicare Other | Admitting: Cardiology

## 2020-08-06 VITALS — BP 156/88 | HR 78 | Ht 62.0 in | Wt 176.0 lb

## 2020-08-06 DIAGNOSIS — I272 Pulmonary hypertension, unspecified: Secondary | ICD-10-CM

## 2020-08-06 DIAGNOSIS — I498 Other specified cardiac arrhythmias: Secondary | ICD-10-CM | POA: Diagnosis not present

## 2020-08-06 DIAGNOSIS — Z950 Presence of cardiac pacemaker: Secondary | ICD-10-CM | POA: Diagnosis not present

## 2020-08-06 DIAGNOSIS — I10 Essential (primary) hypertension: Secondary | ICD-10-CM

## 2020-08-06 DIAGNOSIS — R0602 Shortness of breath: Secondary | ICD-10-CM

## 2020-08-06 NOTE — Patient Instructions (Signed)

## 2020-08-06 NOTE — Progress Notes (Signed)
Cardiology Office Note:    Date:  08/06/2020   ID:  Lindsey Ross, DOB [birth year], MRN [id number]  PCP:  Chilton Greathouse, MD  Cardiologist:  Gypsy Balsam, MD    Referring MD: Chilton Greathouse, MD   Chief Complaint  Patient presents with  . Follow-up  I am doing fine  History of Present Illness:    Lindsey Ross is a 86 y.o. female with past medical history significant for pulmonary emboli, status post my medical implantation secondary to heart block, essential hypertension, pulmonary hypertension with latest measurements of 50 mmHg.  He comes today to my office.  Overall seems to be doing well.  Still cooks a lot enjoyed a lot described to have some swelling of lower extremities at the meantime when she stands for long period of time but overall denies chest pain tightness squeezing pressure burning chest  Past Medical History:  Diagnosis Date  . Acute bilateral low back pain without sciatica 08/31/2016  . Acute deep vein thrombosis (DVT) of left tibial vein (HCC) 06/26/2017  . Acute pain of right wrist 08/31/2016  . Aortic atherosclerosis (HCC) 06/25/2017  . Arthritis    "right ankle; maybe some in her back" (07/07/2017)  . Bradyarrhythmia 06/23/2017  . Burning sensation of feet    Bilateral feet  . Closed fracture distal radius and ulna, right, initial encounter 08/31/2016  . DVT (deep venous thrombosis) (HCC) 06/24/2017   LLE  . Essential hypertension 06/23/2017  . Frequent urination   . Heart block AV second degree 06/24/2017  . Hypertension   . Osteoarthritis of right hip 03/25/2015  . Pacemaker 01/13/2018  . Presence of permanent cardiac pacemaker   . Pulmonary embolism (HCC) 06/24/2017  . Pulmonary hypertension, unspecified (HCC) 12/28/2017   70 mmHg by echo in October 2018  . Shortness of breath dyspnea    with exertion  . SOB (shortness of breath) on exertion 06/23/2017  . Status post total replacement of right hip 03/25/2015    Past Surgical History:  Procedure  Laterality Date  . ABDOMINAL HYSTERECTOMY    . ANKLE FRACTURE SURGERY Right 2003  . CATARACT EXTRACTION W/ INTRAOCULAR LENS  IMPLANT, BILATERAL Bilateral   . FRACTURE SURGERY    . INSERT / REPLACE / REMOVE PACEMAKER  07/07/2017  . JOINT REPLACEMENT    . LAPAROSCOPIC CHOLECYSTECTOMY    . PACEMAKER IMPLANT N/A 07/07/2017   Procedure: PACEMAKER IMPLANT;  Surgeon: Regan Lemming, MD;  [facility];  Service: Cardiovascular;  Laterality: N/A;  . TOTAL HIP ARTHROPLASTY Right 03/25/2015   Procedure: RIGHT TOTAL HIP ARTHROPLASTY ANTERIOR APPROACH;  Surgeon: Kathryne Hitch, MD;  Location: [facility];  Service: Orthopedics;  Laterality: Right;    Current Medications: Current Meds  Medication Sig  . acetaminophen (TYLENOL) 500 MG tablet Take 1,000 mg by mouth every 6 (six) hours as needed for headache (pain).  Marland Kitchen albuterol (PROVENTIL HFA;VENTOLIN HFA) 108 (90 Base) MCG/ACT inhaler Inhale 1-2 puffs into the lungs every 6 (six) hours as needed for wheezing or shortness of breath.  Marland Kitchen apixaban (ELIQUIS) 5 MG TABS tablet Take 5 mg by mouth 2 (two) times daily.  . Calcium Carb-Cholecalciferol (CALCIUM + D3 PO) Take 1 tablet by mouth daily.  . furosemide (LASIX) 20 MG tablet TAKE 1 TABLET(20 MG) BY MOUTH DAILY  . gabapentin (NEURONTIN) 100 MG capsule Take 200 mg by mouth 3 (three) times daily.  . Multiple Vitamins-Minerals (MULTIVITAMIN WITH MINERALS) tablet Take 1 tablet by mouth daily.  Marland Kitchen  telmisartan-hydrochlorothiazide (MICARDIS HCT) 80-25 MG tablet Take 1 tablet by mouth daily.     Allergies:   Patient has no known allergies.   Social History   Socioeconomic History  . Marital status: Married    Spouse name: Not on file  . Number of children: Not on file  . Years of education: Not on file  . Highest education level: Not on file  Occupational History  . Not on file  Tobacco Use  . Smoking status: Never Smoker  . Smokeless tobacco: Never Used  Vaping Use  . Vaping  Use: Never used  Substance and Sexual Activity  . Alcohol use: No  . Drug use: No  . Sexual activity: Not on file  Other Topics Concern  . Not on file  Social History Narrative  . Not on file   Social Determinants of Health   Financial Resource Strain: Not on file  Food Insecurity: Not on file  Transportation Needs: Not on file  Physical Activity: Not on file  Stress: Not on file  Social Connections: Not on file     Family History: The patient's family history is negative for Pulmonary embolism. ROS:   Please see the history of present illness.    All 14 point review of systems negative except as described per history of present illness  EKGs/Labs/Other Studies Reviewed:      Recent Labs: No results found for requested labs within last 8760 hours.  Recent Lipid Panel No results found for: CHOL, TRIG, HDL, CHOLHDL, VLDL, LDLCALC, LDLDIRECT  Physical Exam:    VS:  BP (!) 156/88 (BP Location: Right Arm, Patient Position: Sitting)   Pulse 78   Ht 5\' 2"  (1.575 m)   Wt 176 lb (79.8 kg)   SpO2 98%   BMI 32.19 kg/m     Wt Readings from Last 3 Encounters:  08/06/20 176 lb (79.8 kg)  02/08/20 176 lb (79.8 kg)  08/23/19 180 lb 1.9 oz (81.7 kg)     GEN:  Well nourished, well developed in no acute distress HEENT: Normal NECK: No JVD; No carotid bruits LYMPHATICS: No lymphadenopathy CARDIAC: RRR, no murmurs, no rubs, no gallops RESPIRATORY:  Clear to auscultation without rales, wheezing or rhonchi  ABDOMEN: Soft, non-tender, non-distended MUSCULOSKELETAL:  No edema; No deformity  SKIN: Warm and dry LOWER EXTREMITIES: no swelling NEUROLOGIC:  Alert and oriented x 3 PSYCHIATRIC:  Normal affect   ASSESSMENT:    1. Pulmonary hypertension, unspecified (HCC)   2. Primary hypertension   3. Pacemaker   4. SOB (shortness of breath) on exertion   5. Bradyarrhythmia    PLAN:    In order of problems listed above:  1. Pulmonary hypertension last estimation 50 mmHg.   Conservative approach as she wished. 2. Essential hypertension: Blood pressure elevated today but.  Home always is good.  We will continue present management. 3. Dyslipidemia I did review her K PN I do have her LDL from Jan 17, 2020 showing 94 and HDL 65.  We will continue present management. 4. Shortness of breath denies having any. 5. 5.  Pacemaker reviewed interrogation normal function.  Overall she is doing very well I will continue present management.   Medication Adjustments/Labs and Tests Ordered: Current medicines are reviewed at length with the patient today.  Concerns regarding medicines are outlined above.  No orders of the defined types were placed in this encounter.  Medication changes: No orders of the defined types were placed in this encounter.  Signed, Georgeanna Lea, MD, [facility] 08/06/2020 4:00 PM    [facility]

## 2020-08-18 ENCOUNTER — Other Ambulatory Visit: Payer: Self-pay

## 2020-08-18 ENCOUNTER — Ambulatory Visit (INDEPENDENT_AMBULATORY_CARE_PROVIDER_SITE_OTHER): Payer: Medicare Other | Admitting: Cardiology

## 2020-08-18 ENCOUNTER — Encounter: Payer: Self-pay | Admitting: Cardiology

## 2020-08-18 DIAGNOSIS — Z950 Presence of cardiac pacemaker: Secondary | ICD-10-CM

## 2020-08-18 DIAGNOSIS — I441 Atrioventricular block, second degree: Secondary | ICD-10-CM

## 2020-08-18 NOTE — Patient Instructions (Signed)
Medication Instructions:  Your physician recommends that you continue on your current medications as directed. Please refer to the Current Medication list given to you today.  *If you need a refill on your cardiac medications before your next appointment, please call your pharmacy*   Lab Work: None ordered   Testing/Procedures: None ordered   Follow-Up: At [facility], you and your health needs are our priority.  As part of our continuing mission to provide you with exceptional heart care, we have created designated Provider Care Teams.  These Care Teams include your primary Cardiologist (physician) and Advanced Practice Providers (APPs -  Physician Assistants and Nurse Practitioners) who all work together to provide you with the care you need, when you need it.  Remote monitoring is used to monitor your Pacemaker or ICD from home. This monitoring reduces the number of office visits required to check your device to one time per year. It allows [place] to keep an eye on the functioning of your device to ensure it is working properly. You are scheduled for a device check from home on 10/09/20. You may send your transmission at any time that day. If you have a wireless device, the transmission will be sent automatically. After your physician reviews your transmission, you will receive a postcard with your next transmission date.  Your next appointment:   1 year(s)  The format for your next appointment:   In Person  Provider:   Loman Brooklyn, MD   Thank you for choosing [facility]!!   Dory Horn, RN [phone]

## 2020-08-18 NOTE — Progress Notes (Signed)
Electrophysiology Office Note   Date:  08/18/2020   ID:  Lindsey Ross, DOB 07-13-1925, MRN [id number]  PCP:  Chilton Greathouse, MD  Cardiologist:  Revankar Primary Electrophysiologist:  Linnae Rasool Jorja Loa, MD    No chief complaint on file.    History of Present Illness: Lindsey Ross is a 86 y.o. female who is being seen today for the evaluation of 2:1 AV block at the request of Avva, Ravisankar, MD. Presenting today for electrophysiology evaluation.    She has a history of 2 1 AV block, hypertension, and a pulmonary embolism.  She initially presented to the hospital with shortness of breath and was found to have a PE.  She was put on anticoagulation repeat presented with heart block and is now status post Medtronic dual-chamber pacemaker implanted 07/07/2017.  Today, denies symptoms of palpitations, chest pain, shortness of breath, orthopnea, PND, lower extremity edema, claudication, dizziness, presyncope, syncope, bleeding, or neurologic sequela. The patient is tolerating medications without difficulties.  Since last being seen she has done well.  She continues to have weakness and fatigue.  She has atrial fibrillation, though her burden is quite low at 1.9%.   Past Medical History:  Diagnosis Date   Acute bilateral low back pain without sciatica 08/31/2016   Acute deep vein thrombosis (DVT) of left tibial vein (HCC) 06/26/2017   Acute pain of right wrist 08/31/2016   Aortic atherosclerosis (HCC) 06/25/2017   Arthritis    "right ankle; maybe some in her back" (07/07/2017)   Bradyarrhythmia 06/23/2017   Burning sensation of feet    Bilateral feet   Closed fracture distal radius and ulna, right, initial encounter 08/31/2016   DVT (deep venous thrombosis) (HCC) 06/24/2017   LLE   Essential hypertension 06/23/2017   Frequent urination    Heart block AV second degree 06/24/2017   Hypertension    Osteoarthritis of right hip 03/25/2015   Pacemaker 01/13/2018    Presence of permanent cardiac pacemaker    Pulmonary embolism (HCC) 06/24/2017   Pulmonary hypertension, unspecified (HCC) 12/28/2017   70 mmHg by echo in October 2018   Shortness of breath dyspnea    with exertion   SOB (shortness of breath) on exertion 06/23/2017   Status post total replacement of right hip 03/25/2015   Past Surgical History:  Procedure Laterality Date   ABDOMINAL HYSTERECTOMY     ANKLE FRACTURE SURGERY Right 2003   CATARACT EXTRACTION W/ INTRAOCULAR LENS  IMPLANT, BILATERAL Bilateral    FRACTURE SURGERY     INSERT / REPLACE / REMOVE PACEMAKER  07/07/2017   JOINT REPLACEMENT     LAPAROSCOPIC CHOLECYSTECTOMY     PACEMAKER IMPLANT N/A 07/07/2017   Procedure: PACEMAKER IMPLANT;  Surgeon: Regan Lemming, MD;  Location: [facility];  Service: Cardiovascular;  Laterality: N/A;   TOTAL HIP ARTHROPLASTY Right 03/25/2015   Procedure: RIGHT TOTAL HIP ARTHROPLASTY ANTERIOR APPROACH;  Surgeon: Kathryne Hitch, MD;  Location: [facility] OR;  Service: Orthopedics;  Laterality: Right;     Current Outpatient Medications  Medication Sig Dispense Refill   acetaminophen (TYLENOL) 500 MG tablet Take 1,000 mg by mouth every 6 (six) hours as needed for headache (pain).     albuterol (PROVENTIL HFA;VENTOLIN HFA) 108 (90 Base) MCG/ACT inhaler Inhale 1-2 puffs into the lungs every 6 (six) hours as needed for wheezing or shortness of breath.     apixaban (ELIQUIS) 5 MG TABS tablet Take 5 mg by mouth 2 (two) times daily.  Calcium Carb-Cholecalciferol (CALCIUM + D3 PO) Take 1 tablet by mouth daily.     furosemide (LASIX) 20 MG tablet TAKE 1 TABLET(20 MG) BY MOUTH DAILY 90 tablet 3   gabapentin (NEURONTIN) 100 MG capsule Take 200 mg by mouth 3 (three) times daily.     Multiple Vitamins-Minerals (MULTIVITAMIN WITH MINERALS) tablet Take 1 tablet by mouth daily.     telmisartan-hydrochlorothiazide (MICARDIS HCT) 80-25 MG tablet Take 1 tablet by mouth daily.     No  current facility-administered medications for this visit.    Allergies:   Patient has no known allergies.   Social History:  The patient  reports that she has never smoked. She has never used smokeless tobacco. She reports that she does not drink alcohol and does not use drugs.   Family History:  The patient's family history is not on file.   ROS:  Please see the history of present illness.   Otherwise, review of systems is positive for none.   All other systems are reviewed and negative.   PHYSICAL EXAM: VS:  BP (!) 142/70    Pulse 81    Ht 5\' 2"  (1.575 m)    Wt 176 lb (79.8 kg)    SpO2 96%    BMI 32.19 kg/m  , BMI Body mass index is 32.19 kg/m. GEN: Well nourished, well developed, in no acute distress  HEENT: normal  Neck: no JVD, carotid bruits, or masses Cardiac: RRR; no murmurs, rubs, or gallops,no edema  Respiratory:  clear to auscultation bilaterally, normal work of breathing GI: soft, nontender, nondistended, + BS MS: no deformity or atrophy  Skin: warm and dry, device site well healed Neuro:  Strength and sensation are intact Psych: euthymic mood, full affect  EKG:  EKG is not ordered today. Personal review of the ekg ordered 08/06/20 shows atrial sensed, ventricular paced  Personal review of the device interrogation today. Results in Paceart    Recent Labs: No results found for requested labs within last 8760 hours.    Lipid Panel  No results found for: CHOL, TRIG, HDL, CHOLHDL, VLDL, LDLCALC, LDLDIRECT   Wt Readings from Last 3 Encounters:  08/18/20 176 lb (79.8 kg)  08/06/20 176 lb (79.8 kg)  02/08/20 176 lb (79.8 kg)      Other studies Reviewed: Additional studies/ records that were reviewed today include: TTE 01/28/2020 Review of the above records today demonstrates:  1. Left ventricular ejection fraction, by estimation, is 55 to 60%. The  left ventricle has normal function. The left ventricle has no regional  wall motion abnormalities. There is mild  concentric left ventricular  hypertrophy. Left ventricular diastolic  parameters are consistent with Grade I diastolic dysfunction (impaired  relaxation). Elevated left ventricular end-diastolic pressure.  2. Right ventricular systolic function is normal. The right ventricular  size is normal. There is moderately elevated pulmonary artery systolic  pressure.  3. The mitral valve is degenerative. Mild mitral valve regurgitation. No  evidence of mitral stenosis.  4. The aortic valve is tricuspid. Aortic valve regurgitation is mild.  Mild aortic valve sclerosis is present, with no evidence of aortic valve  stenosis.  5. There is mild to moderate dilatation of the ascending aorta measuring  40 mm.  6. The inferior vena cava is normal in size with greater than 50%  respiratory variability, suggesting right atrial pressure of 3 mmHg.   ASSESSMENT AND PLAN:  1.  2-1 second-degree AV block: Status post Medtronic dual-chamber pacemaker implanted 07/07/2017.  Device  functioning appropriately.  No changes.    2.  Hypertension: Blood pressure mildly elevated today.  It is usually better controlled at home.  No changes.  3.  Paroxysmal atrial fibrillation: Currently on Eliquis and atrial sensed.  CHA2DS2-VASc of 4.  She does have weakness and fatigue, but due to her low burden of atrial fibrillation, I do not think that this contributes.  Current medicines are reviewed at length with the patient today.   The patient does not have concerns regarding her medicines.  The following changes were made today: None  Labs/ tests ordered today include:  No orders of the defined types were placed in this encounter.    Disposition:   FU with Lawerance Matsuo 12 months  Signed, Dravin Lance Jorja Loa, MD  08/18/2020 3:16 PM     [facility] [address] [place] [address] [phone] (office) [phone] (fax)

## 2020-10-09 ENCOUNTER — Ambulatory Visit (INDEPENDENT_AMBULATORY_CARE_PROVIDER_SITE_OTHER): Payer: Medicare Other

## 2020-10-09 DIAGNOSIS — I441 Atrioventricular block, second degree: Secondary | ICD-10-CM | POA: Diagnosis not present

## 2020-10-09 LAB — CUP PACEART REMOTE DEVICE CHECK
Battery Remaining Longevity: 96 mo
Battery Voltage: 2.92 V
Brady Statistic AP VP Percent: 47.12 %
Brady Statistic AP VS Percent: 0 %
Brady Statistic AS VP Percent: 52.87 %
Brady Statistic AS VS Percent: 0.01 %
Brady Statistic RA Percent Paced: 45.76 %
Brady Statistic RV Percent Paced: 99.99 %
Date Time Interrogation Session: 20220217000804
Implantable Lead Implant Date: 20181115
Implantable Lead Implant Date: 20181115
Implantable Lead Location: 753859
Implantable Lead Location: 753860
Implantable Lead Model: 5076
Implantable Lead Model: 5076
Implantable Pulse Generator Implant Date: 20181115
Lead Channel Impedance Value: 323 Ohm
Lead Channel Impedance Value: 380 Ohm
Lead Channel Impedance Value: 399 Ohm
Lead Channel Impedance Value: 475 Ohm
Lead Channel Pacing Threshold Amplitude: 0.5 V
Lead Channel Pacing Threshold Amplitude: 0.75 V
Lead Channel Pacing Threshold Pulse Width: 0.4 ms
Lead Channel Pacing Threshold Pulse Width: 0.4 ms
Lead Channel Sensing Intrinsic Amplitude: 0.625 mV
Lead Channel Sensing Intrinsic Amplitude: 0.625 mV
Lead Channel Sensing Intrinsic Amplitude: 6.5 mV
Lead Channel Setting Pacing Amplitude: 2 V
Lead Channel Setting Pacing Amplitude: 2.5 V
Lead Channel Setting Pacing Pulse Width: 0.4 ms
Lead Channel Setting Sensing Sensitivity: 1.2 mV

## 2020-10-16 NOTE — Progress Notes (Signed)
Remote pacemaker transmission.   

## 2020-11-06 ENCOUNTER — Other Ambulatory Visit: Payer: Self-pay | Admitting: Cardiology

## 2020-11-25 DIAGNOSIS — H353131 Nonexudative age-related macular degeneration, bilateral, early dry stage: Secondary | ICD-10-CM | POA: Diagnosis not present

## 2020-11-25 DIAGNOSIS — Z961 Presence of intraocular lens: Secondary | ICD-10-CM | POA: Diagnosis not present

## 2020-11-25 DIAGNOSIS — H52203 Unspecified astigmatism, bilateral: Secondary | ICD-10-CM | POA: Diagnosis not present

## 2021-01-08 ENCOUNTER — Ambulatory Visit (INDEPENDENT_AMBULATORY_CARE_PROVIDER_SITE_OTHER): Payer: Medicare Other

## 2021-01-08 DIAGNOSIS — I441 Atrioventricular block, second degree: Secondary | ICD-10-CM

## 2021-01-08 LAB — CUP PACEART REMOTE DEVICE CHECK
Battery Remaining Longevity: 87 mo
Battery Voltage: 2.91 V
Brady Statistic AP VP Percent: 18.3 %
Brady Statistic AP VS Percent: 0 %
Brady Statistic AS VP Percent: 81.67 %
Brady Statistic AS VS Percent: 0.02 %
Brady Statistic RA Percent Paced: 18.11 %
Brady Statistic RV Percent Paced: 99.98 %
Date Time Interrogation Session: 20220518222722
Implantable Lead Implant Date: 20181115
Implantable Lead Implant Date: 20181115
Implantable Lead Location: 753859
Implantable Lead Location: 753860
Implantable Lead Model: 5076
Implantable Lead Model: 5076
Implantable Pulse Generator Implant Date: 20181115
Lead Channel Impedance Value: 323 Ohm
Lead Channel Impedance Value: 380 Ohm
Lead Channel Impedance Value: 418 Ohm
Lead Channel Impedance Value: 494 Ohm
Lead Channel Pacing Threshold Amplitude: 0.5 V
Lead Channel Pacing Threshold Amplitude: 1 V
Lead Channel Pacing Threshold Pulse Width: 0.4 ms
Lead Channel Pacing Threshold Pulse Width: 0.4 ms
Lead Channel Sensing Intrinsic Amplitude: 0.75 mV
Lead Channel Sensing Intrinsic Amplitude: 0.75 mV
Lead Channel Sensing Intrinsic Amplitude: 6.5 mV
Lead Channel Setting Pacing Amplitude: 2 V
Lead Channel Setting Pacing Amplitude: 2.75 V
Lead Channel Setting Pacing Pulse Width: 0.4 ms
Lead Channel Setting Sensing Sensitivity: 1.2 mV

## 2021-01-30 NOTE — Progress Notes (Signed)
Remote pacemaker transmission.   

## 2021-02-09 ENCOUNTER — Ambulatory Visit (INDEPENDENT_AMBULATORY_CARE_PROVIDER_SITE_OTHER): Payer: Medicare Other | Admitting: Cardiology

## 2021-02-09 ENCOUNTER — Other Ambulatory Visit: Payer: Self-pay

## 2021-02-09 ENCOUNTER — Encounter: Payer: Self-pay | Admitting: Cardiology

## 2021-02-09 VITALS — BP 140/84 | HR 80 | Ht 62.0 in | Wt 165.0 lb

## 2021-02-09 DIAGNOSIS — I272 Pulmonary hypertension, unspecified: Secondary | ICD-10-CM | POA: Diagnosis not present

## 2021-02-09 DIAGNOSIS — I441 Atrioventricular block, second degree: Secondary | ICD-10-CM

## 2021-02-09 DIAGNOSIS — I10 Essential (primary) hypertension: Secondary | ICD-10-CM

## 2021-02-09 DIAGNOSIS — Z950 Presence of cardiac pacemaker: Secondary | ICD-10-CM | POA: Diagnosis not present

## 2021-02-09 NOTE — Progress Notes (Signed)
Cardiology Office Note:    Date:  02/09/2021   ID:  Lindsey Ross, DOB [birth year], MRN [id number]  PCP:  Chilton Greathouse, MD  Cardiologist:  Gypsy Balsam, MD    Referring MD: Chilton Greathouse, MD   Chief Complaint  Patient presents with   Follow-up    History of Present Illness:    Lindsey Ross is a 87 y.o. female with past medical history significant for pulmonary emboli, status post pacemaker implantation secondary to heart block, essential hypertension.  She comes today 2 months of follow-up.  Overall she lost her husband that being together for 65 years he was buried last week.  Obviously she is grieving after that.  She is more mobile now and she has to do more things at home right now to take care of things that she said she is more short of breath.  She does have 1 flight of stairs when she try to go up she gets short of breath.  She denies have any chest pain tightness squeezing pressure mid chest no palpitations no dizziness  Past Medical History:  Diagnosis Date   Acute bilateral low back pain without sciatica 08/31/2016   Acute deep vein thrombosis (DVT) of left tibial vein (HCC) 06/26/2017   Acute pain of right wrist 08/31/2016   Aortic atherosclerosis (HCC) 06/25/2017   Arthritis    "right ankle; maybe some in her back" (07/07/2017)   Bradyarrhythmia 06/23/2017   Burning sensation of feet    Bilateral feet   Closed fracture distal radius and ulna, right, initial encounter 08/31/2016   DVT (deep venous thrombosis) (HCC) 06/24/2017   LLE   Essential hypertension 06/23/2017   Frequent urination    Heart block AV second degree 06/24/2017   Hypertension    Osteoarthritis of right hip 03/25/2015   Pacemaker 01/13/2018   Presence of permanent cardiac pacemaker    Pulmonary embolism (HCC) 06/24/2017   Pulmonary hypertension, unspecified (HCC) 12/28/2017   70 mmHg by echo in October 2018   Shortness of breath dyspnea    with exertion   SOB (shortness of breath) on  exertion 06/23/2017   Status post total replacement of right hip 03/25/2015    Past Surgical History:  Procedure Laterality Date   ABDOMINAL HYSTERECTOMY     ANKLE FRACTURE SURGERY Right 2003   CATARACT EXTRACTION W/ INTRAOCULAR LENS  IMPLANT, BILATERAL Bilateral    FRACTURE SURGERY     INSERT / REPLACE / REMOVE PACEMAKER  07/07/2017   JOINT REPLACEMENT     LAPAROSCOPIC CHOLECYSTECTOMY     PACEMAKER IMPLANT N/A 07/07/2017   Procedure: PACEMAKER IMPLANT;  Surgeon: Regan Lemming, MD;  [facility]: [facility];  Service: Cardiovascular;  Laterality: N/A;   TOTAL HIP ARTHROPLASTY Right 03/25/2015   Procedure: RIGHT TOTAL HIP ARTHROPLASTY ANTERIOR APPROACH;  Surgeon: Kathryne Hitch, MD;  Location: [facility] OR;  Service: Orthopedics;  Laterality: Right;    Current Medications: Current Meds  Medication Sig   acetaminophen (TYLENOL) 500 MG tablet Take 1,000 mg by mouth every 6 (six) hours as needed for headache (pain).   albuterol (PROVENTIL HFA;VENTOLIN HFA) 108 (90 Base) MCG/ACT inhaler Inhale 1-2 puffs into the lungs every 6 (six) hours as needed for wheezing or shortness of breath.   apixaban (ELIQUIS) 5 MG TABS tablet Take 5 mg by mouth 2 (two) times daily.   Calcium Carb-Cholecalciferol (CALCIUM + D3 PO) Take 1 tablet by mouth daily.   furosemide (LASIX) 20 MG tablet TAKE 1 TABLET(20  MG) BY MOUTH DAILY   gabapentin (NEURONTIN) 100 MG capsule Take 200 mg by mouth 3 (three) times daily.   Multiple Vitamins-Minerals (MULTIVITAMIN WITH MINERALS) tablet Take 1 tablet by mouth daily.   telmisartan-hydrochlorothiazide (MICARDIS HCT) 80-25 MG tablet Take 1 tablet by mouth daily.     Allergies:   Patient has no known allergies.   Social History   Socioeconomic History   Marital status: Married    Spouse name: Not on file   Number of children: Not on file   Years of education: Not on file   Highest education level: Not on file  Occupational History   Not on file  Tobacco Use    Smoking status: Never   Smokeless tobacco: Never  Vaping Use   Vaping Use: Never used  Substance and Sexual Activity   Alcohol use: No   Drug use: No   Sexual activity: Not on file  Other Topics Concern   Not on file  Social History Narrative   Not on file   Social Determinants of Health   Financial Resource Strain: Not on file  Food Insecurity: Not on file  Transportation Needs: Not on file  Physical Activity: Not on file  Stress: Not on file  Social Connections: Not on file     Family History: The patient's family history is negative for Pulmonary embolism. ROS:   Please see the history of present illness.    All 14 point review of systems negative except as described per history of present illness  EKGs/Labs/Other Studies Reviewed:      Recent Labs: No results found for requested labs within last 8760 hours.  Recent Lipid Panel No results found for: CHOL, TRIG, HDL, CHOLHDL, VLDL, LDLCALC, LDLDIRECT  Physical Exam:    VS:  BP 140/84 (BP Location: Left Arm, Patient Position: Sitting, Cuff Size: Normal)   Pulse 80   Ht 5\' 2"  (1.575 m)   Wt 165 lb (74.8 kg)   SpO2 97%   BMI 30.18 kg/m     Wt Readings from Last 3 Encounters:  02/09/21 165 lb (74.8 kg)  08/18/20 176 lb (79.8 kg)  08/06/20 176 lb (79.8 kg)     GEN:  Well nourished, well developed in no acute distress HEENT: Normal NECK: No JVD; No carotid bruits LYMPHATICS: No lymphadenopathy CARDIAC: RRR, no murmurs, no rubs, no gallops RESPIRATORY:  Clear to auscultation without rales, wheezing or rhonchi, few crackles both bases ABDOMEN: Soft, non-tender, non-distended MUSCULOSKELETAL:  No edema; No deformity  SKIN: Warm and dry LOWER EXTREMITIES: no swelling NEUROLOGIC:  Alert and oriented x 3 PSYCHIATRIC:  Normal affect   ASSESSMENT:    1. Pulmonary hypertension, unspecified (HCC)   2. Heart block AV second degree   3. Pacemaker   4. Essential hypertension    PLAN:    In order of  problems listed above:  Pulmonary hypertension.  Only mild on previous examination in the middle of 52.  But because of her age she does not want to do anything about it.  I offered her another echocardiogram she declined. Heart block she does have pacemaker I did review interrogation 7.5 years left in device normal function Essential hypertension blood pressure minimally elevated today but again she is crying in the office creating her husband passing. Dyslipidemia her LDL is a 94 HDL 65 this is from last year.  We will continue present management.  She does not want to have any test done today. On the physical examination she  does have few crackles over both bases ask her to increase dose of Lasix from 20 mg to 40 mg daily for about 2 to 3 days and see if there is any improvement sclerosis to let me know if there is if that is the case we will check her Chem-7 probably continue with this regimen.  Her daughter was present during the office visit.   Medication Adjustments/Labs and Tests Ordered: Current medicines are reviewed at length with the patient today.  Concerns regarding medicines are outlined above.  No orders of the defined types were placed in this encounter.  Medication changes: No orders of the defined types were placed in this encounter.   Signed, Georgeanna Lea, MD, [facility] 02/09/2021 2:37 PM    [facility]

## 2021-02-09 NOTE — Patient Instructions (Signed)
Medication Instructions:  Your physician has recommended you make the following change in your medication:  Take: Lasix 40 mg daily for 3 days. If not better after this start 20 mg of lasix daily. Either way after 3 days of lasix 40 please call our office and let us know how you are doing.   *If you need a refill on your cardiac medications before your next appointment, please call your pharmacy*   Lab Work: None  If you have labs (blood work) drawn today and your tests are completely normal, you will receive your results only by: MyChart Message (if you have MyChart) OR A paper copy in the mail If you have any lab test that is abnormal or we need to change your treatment, we will call you to review the results.   Testing/Procedures: None   Follow-Up: At [facility], you and your health needs are our priority.  As part of our continuing mission to provide you with exceptional heart care, we have created designated Provider Care Teams.  These Care Teams include your primary Cardiologist (physician) and Advanced Practice Providers (APPs -  Physician Assistants and Nurse Practitioners) who all work together to provide you with the care you need, when you need it.  We recommend signing up for the patient portal called "MyChart".  Sign up information is provided on this After Visit Summary.  MyChart is used to connect with patients for Virtual Visits (Telemedicine).  Patients are able to view lab/test results, encounter notes, upcoming appointments, etc.  Non-urgent messages can be sent to your provider as well.   To learn more about what you can do with MyChart, go to [email or web address].    Your next appointment:   3 month(s)  The format for your next appointment:   In Person  Provider:   Gypsy Balsam, MD   Other Instructions

## 2021-02-13 ENCOUNTER — Telehealth: Payer: Self-pay | Admitting: Cardiology

## 2021-02-13 DIAGNOSIS — I272 Pulmonary hypertension, unspecified: Secondary | ICD-10-CM

## 2021-02-13 DIAGNOSIS — I10 Essential (primary) hypertension: Secondary | ICD-10-CM

## 2021-02-13 NOTE — Telephone Encounter (Signed)
Spoke with Okey Regal, informed of the following. Will stop by w/ the patient next week.

## 2021-02-13 NOTE — Telephone Encounter (Signed)
Patient's daughter states the patient's lasix was increased to 40mg . She states she is doing better at the 40 mg level. She would like to know if there is anything else they need to do.

## 2021-02-17 DIAGNOSIS — I272 Pulmonary hypertension, unspecified: Secondary | ICD-10-CM | POA: Diagnosis not present

## 2021-02-17 DIAGNOSIS — I10 Essential (primary) hypertension: Secondary | ICD-10-CM | POA: Diagnosis not present

## 2021-02-18 LAB — BASIC METABOLIC PANEL
BUN/Creatinine Ratio: 32 — ABNORMAL HIGH (ref 12–28)
BUN: 31 mg/dL (ref 10–36)
CO2: 26 mmol/L (ref 20–29)
Calcium: 10.1 mg/dL (ref 8.7–10.3)
Chloride: 96 mmol/L (ref 96–106)
Creatinine, Ser: 0.96 mg/dL (ref 0.57–1.00)
Glucose: 166 mg/dL — ABNORMAL HIGH (ref 65–99)
Potassium: 4.2 mmol/L (ref 3.5–5.2)
Sodium: 139 mmol/L (ref 134–144)
eGFR: 55 mL/min/{1.73_m2} — ABNORMAL LOW (ref >59)

## 2021-02-20 ENCOUNTER — Telehealth: Payer: Self-pay

## 2021-02-20 NOTE — Telephone Encounter (Signed)
Patient notified of results.

## 2021-02-20 NOTE — Telephone Encounter (Signed)
-----   Message from Georgeanna Lea, MD sent at 02/20/2021 12:40 PM EDT ----- Chem-7 looks normal, continue present management

## 2021-02-26 DIAGNOSIS — M810 Age-related osteoporosis without current pathological fracture: Secondary | ICD-10-CM | POA: Diagnosis not present

## 2021-02-26 DIAGNOSIS — I129 Hypertensive chronic kidney disease with stage 1 through stage 4 chronic kidney disease, or unspecified chronic kidney disease: Secondary | ICD-10-CM | POA: Diagnosis not present

## 2021-02-26 DIAGNOSIS — E785 Hyperlipidemia, unspecified: Secondary | ICD-10-CM | POA: Diagnosis not present

## 2021-02-26 DIAGNOSIS — E1151 Type 2 diabetes mellitus with diabetic peripheral angiopathy without gangrene: Secondary | ICD-10-CM | POA: Diagnosis not present

## 2021-03-05 DIAGNOSIS — M810 Age-related osteoporosis without current pathological fracture: Secondary | ICD-10-CM | POA: Diagnosis not present

## 2021-03-05 DIAGNOSIS — E1151 Type 2 diabetes mellitus with diabetic peripheral angiopathy without gangrene: Secondary | ICD-10-CM | POA: Diagnosis not present

## 2021-03-05 DIAGNOSIS — N1831 Chronic kidney disease, stage 3a: Secondary | ICD-10-CM | POA: Diagnosis not present

## 2021-03-05 DIAGNOSIS — Z7901 Long term (current) use of anticoagulants: Secondary | ICD-10-CM | POA: Diagnosis not present

## 2021-03-05 DIAGNOSIS — I2699 Other pulmonary embolism without acute cor pulmonale: Secondary | ICD-10-CM | POA: Diagnosis not present

## 2021-03-05 DIAGNOSIS — M5380 Other specified dorsopathies, site unspecified: Secondary | ICD-10-CM | POA: Diagnosis not present

## 2021-03-05 DIAGNOSIS — E785 Hyperlipidemia, unspecified: Secondary | ICD-10-CM | POA: Diagnosis not present

## 2021-03-05 DIAGNOSIS — R82998 Other abnormal findings in urine: Secondary | ICD-10-CM | POA: Diagnosis not present

## 2021-03-05 DIAGNOSIS — K449 Diaphragmatic hernia without obstruction or gangrene: Secondary | ICD-10-CM | POA: Diagnosis not present

## 2021-03-05 DIAGNOSIS — M79604 Pain in right leg: Secondary | ICD-10-CM | POA: Diagnosis not present

## 2021-03-05 DIAGNOSIS — I272 Pulmonary hypertension, unspecified: Secondary | ICD-10-CM | POA: Diagnosis not present

## 2021-03-05 DIAGNOSIS — Z0000 Encounter for general adult medical examination without abnormal findings: Secondary | ICD-10-CM | POA: Diagnosis not present

## 2021-03-05 DIAGNOSIS — I129 Hypertensive chronic kidney disease with stage 1 through stage 4 chronic kidney disease, or unspecified chronic kidney disease: Secondary | ICD-10-CM | POA: Diagnosis not present

## 2021-03-16 ENCOUNTER — Telehealth: Payer: Self-pay | Admitting: Cardiology

## 2021-03-16 MED ORDER — FUROSEMIDE 40 MG PO TABS: 40.0000 mg | ORAL_TABLET | Freq: Every day | ORAL | 3 refills | Status: DC

## 2021-03-16 NOTE — Telephone Encounter (Signed)
Bing [facility] asked pt to increase lasix from 20mg  to 40mg  pt would like new rx to increase to 40mg  because she has run out of the 20mg  tablets... please advise.

## 2021-03-16 NOTE — Telephone Encounter (Signed)
Refill sent in per request.  

## 2021-04-09 ENCOUNTER — Ambulatory Visit (INDEPENDENT_AMBULATORY_CARE_PROVIDER_SITE_OTHER): Payer: Medicare Other

## 2021-04-09 DIAGNOSIS — I441 Atrioventricular block, second degree: Secondary | ICD-10-CM | POA: Diagnosis not present

## 2021-04-09 LAB — CUP PACEART REMOTE DEVICE CHECK
Battery Remaining Longevity: 83 mo
Battery Voltage: 2.91 V
Brady Statistic AP VP Percent: 31.19 %
Brady Statistic AP VS Percent: 0 %
Brady Statistic AS VP Percent: 68.79 %
Brady Statistic AS VS Percent: 0.01 %
Brady Statistic RA Percent Paced: 30.31 %
Brady Statistic RV Percent Paced: 99.98 %
Date Time Interrogation Session: 20220817212302
Implantable Lead Implant Date: 20181115
Implantable Lead Implant Date: 20181115
Implantable Lead Location: 753859
Implantable Lead Location: 753860
Implantable Lead Model: 5076
Implantable Lead Model: 5076
Implantable Pulse Generator Implant Date: 20181115
Lead Channel Impedance Value: 323 Ohm
Lead Channel Impedance Value: 418 Ohm
Lead Channel Impedance Value: 475 Ohm
Lead Channel Impedance Value: 494 Ohm
Lead Channel Pacing Threshold Amplitude: 0.875 V
Lead Channel Pacing Threshold Amplitude: 1 V
Lead Channel Pacing Threshold Pulse Width: 0.4 ms
Lead Channel Pacing Threshold Pulse Width: 0.4 ms
Lead Channel Sensing Intrinsic Amplitude: 0.5 mV
Lead Channel Sensing Intrinsic Amplitude: 0.5 mV
Lead Channel Sensing Intrinsic Amplitude: 6.5 mV
Lead Channel Setting Pacing Amplitude: 2 V
Lead Channel Setting Pacing Amplitude: 2.75 V
Lead Channel Setting Pacing Pulse Width: 0.4 ms
Lead Channel Setting Sensing Sensitivity: 1.2 mV

## 2021-04-15 ENCOUNTER — Ambulatory Visit (INDEPENDENT_AMBULATORY_CARE_PROVIDER_SITE_OTHER): Payer: Medicare Other | Admitting: Orthopaedic Surgery

## 2021-04-15 ENCOUNTER — Other Ambulatory Visit: Payer: Self-pay

## 2021-04-15 ENCOUNTER — Ambulatory Visit (INDEPENDENT_AMBULATORY_CARE_PROVIDER_SITE_OTHER): Payer: Medicare Other

## 2021-04-15 ENCOUNTER — Encounter: Payer: Self-pay | Admitting: Orthopaedic Surgery

## 2021-04-15 DIAGNOSIS — M25561 Pain in right knee: Secondary | ICD-10-CM

## 2021-04-15 DIAGNOSIS — G8929 Other chronic pain: Secondary | ICD-10-CM

## 2021-04-15 DIAGNOSIS — M19071 Primary osteoarthritis, right ankle and foot: Secondary | ICD-10-CM | POA: Diagnosis not present

## 2021-04-15 DIAGNOSIS — M1711 Unilateral primary osteoarthritis, right knee: Secondary | ICD-10-CM

## 2021-04-15 DIAGNOSIS — M25571 Pain in right ankle and joints of right foot: Secondary | ICD-10-CM | POA: Diagnosis not present

## 2021-04-15 MED ORDER — LIDOCAINE HCL 1 % IJ SOLN
3.0000 mL | INTRAMUSCULAR | Status: AC | PRN
  Administered 2021-04-15: 3 mL

## 2021-04-15 MED ORDER — DICLOFENAC SODIUM 1 % EX GEL: 2.0000 g | Freq: Four times a day (QID) | CUTANEOUS | 3 refills | Status: DC

## 2021-04-15 MED ORDER — LIDOCAINE HCL 1 % IJ SOLN
2.0000 mL | INTRAMUSCULAR | Status: AC | PRN
  Administered 2021-04-15: 2 mL

## 2021-04-15 MED ORDER — METHYLPREDNISOLONE ACETATE 40 MG/ML IJ SUSP
40.0000 mg | INTRAMUSCULAR | Status: AC | PRN
  Administered 2021-04-15: 40 mg via INTRA_ARTICULAR

## 2021-04-15 NOTE — Progress Notes (Signed)
Office Visit Note   Patient: Lindsey Ross           Date of Birth: 08-01-1925           MRN: [id number] Visit Date: 04/15/2021              Requested by: Chilton Greathouse, MD [address] [place],  [place] [address] PCP: Chilton Greathouse, MD   Assessment & Plan: Visit Diagnoses:  1. Chronic pain of right knee   2. Pain in right ankle and joints of right foot   3. Unilateral primary osteoarthritis, right knee   4. Arthritis of right ankle     Plan:    I did recommend a steroid injection in her right knee and her right ankle and they agreed to this and she tolerated it well.  It is also worth trying outpatient physical therapy to work on quad strengthening and any modalities that can help her mobilize better and decrease her pain especially the right knee.  We will work on setting that up as an outpatient at [facility].  I have also recommended Voltaren gel.  I think this would be a safe medication topically for her knee and her ankle in light of the fact she is on Eliquis.  All questions and concerns were answered and addressed.  We will see her back in 4 weeks for repeat exam and see how she is doing for follow-up.  No x-rays are needed.  Follow-Up Instructions: Return in about 4 weeks (around 05/13/2021).   Orders:  Orders Placed This Encounter  Procedures   Large Joint Inj   Medium Joint Inj   XR Knee 1-2 Views Right   XR Ankle Complete Right   Meds ordered this encounter  Medications   diclofenac Sodium (VOLTAREN) 1 % GEL    Sig: Apply 2 g topically 4 (four) times daily.    Dispense:  100 g    Refill:  3       Procedures: Large Joint Inj: R knee on 04/15/2021 3:24 PM Indications: diagnostic evaluation and pain Details: 22 G 1.5 in needle, superolateral approach  Arthrogram: No  Medications: 3 mL lidocaine 1 %; 40 mg methylPREDNISolone acetate 40 MG/ML Outcome: tolerated well, no immediate complications Procedure, treatment alternatives, risks and benefits  explained, specific risks discussed. Consent was given by the patient. Immediately prior to procedure a time out was called to verify the correct patient, procedure, equipment, support staff and site/side marked as required. Patient was prepped and draped in the usual sterile fashion.    Medium Joint Inj: R ankle on 04/15/2021 3:24 PM Indications: pain Medications: 2 mL lidocaine 1 %; 40 mg methylPREDNISolone acetate 40 MG/ML     Clinical Data: No additional findings.   Subjective: Chief Complaint  Patient presents with   Right Knee - Pain   Right Ankle - Pain  The patient is a 87 year old female who comes in for evaluation treatment of significant right knee and right ankle pain.  Most of her pain is in her knee and family with her states that she is having a really hard time getting around and mobilizing due to to significant right knee and right ankle pain.  She does wear a knee brace but a lot of times it slides down her knee and is between her knee and her ankle.  She has remote history of ankle surgery as well.  We actually replaced her right hip in 2016.  She is on Eliquis so  she cannot take anti-inflammatory medications.  Her knee and ankle pain is detrimentally affecting her mobility, quality life and actives daily living.  She is a patient of Dr. Felipa Eth who sent her our way for further evaluation and treatment of this issue.  She does ambulate with a walker.  HPI  Review of Systems There is currently no listed headache, chest pain, shortness of breath, fever, chills, nausea, vomiting  Objective: Vital Signs: There were no vitals taken for this visit.  Physical Exam She is alert and orient x3 and in no acute distress Ortho Exam Examination of the right knee shows a mild effusion.  There is valgus malalignment with pain throughout the arc of motion of that knee especially the lateral compartment and patellofemoral crepitation.  She has much better range of motion of her right  ankle with well-healed medial lateral surgical signs but pain on stressing ankle joint.  The ankle is well located and ligamentously stable. Specialty Comments:  No specialty comments available.  Imaging: XR Ankle Complete Right  Result Date: 04/15/2021 3 views of the right ankle show retained hardware from fixation of a trimalleolar ankle fracture dislocation.  The bone is osteopenic.  There is posttraumatic arthritis of the ankle joint.  XR Knee 1-2 Views Right  Result Date: 04/15/2021 2 views of the right knee show severe tricompartment arthritis with valgus malalignment.  There is complete joint space narrowing and loss of the lateral compartment and osteophytes in all 3 compartments.    PMFS History: Patient Active Problem List   Diagnosis Date Noted   Hypertension    Presence of permanent cardiac pacemaker    Frequent urination    Burning sensation of feet    Arthritis    Pacemaker 01/13/2018   Pulmonary hypertension, unspecified (HCC) 12/28/2017   Acute deep vein thrombosis (DVT) of left tibial vein (HCC) 06/26/2017   Aortic atherosclerosis (HCC) 06/25/2017   Pulmonary embolism (HCC) 06/24/2017   Heart block AV second degree 06/24/2017   DVT (deep venous thrombosis) (HCC) 06/24/2017   SOB (shortness of breath) on exertion 06/23/2017   Bradyarrhythmia 06/23/2017   Essential hypertension 06/23/2017   Acute pain of right wrist 08/31/2016   Acute bilateral low back pain without sciatica 08/31/2016   Closed fracture distal radius and ulna, right, initial encounter 08/31/2016   Osteoarthritis of right hip 03/25/2015   Status post total replacement of right hip 03/25/2015   Past Medical History:  Diagnosis Date   Acute bilateral low back pain without sciatica 08/31/2016   Acute deep vein thrombosis (DVT) of left tibial vein (HCC) 06/26/2017   Acute pain of right wrist 08/31/2016   Aortic atherosclerosis (HCC) 06/25/2017   Arthritis    "right ankle; maybe some in her back"  (07/07/2017)   Bradyarrhythmia 06/23/2017   Burning sensation of feet    Bilateral feet   Closed fracture distal radius and ulna, right, initial encounter 08/31/2016   DVT (deep venous thrombosis) (HCC) 06/24/2017   LLE   Essential hypertension 06/23/2017   Frequent urination    Heart block AV second degree 06/24/2017   Hypertension    Osteoarthritis of right hip 03/25/2015   Pacemaker 01/13/2018   Presence of permanent cardiac pacemaker    Pulmonary embolism (HCC) 06/24/2017   Pulmonary hypertension, unspecified (HCC) 12/28/2017   70 mmHg by echo in October 2018   Shortness of breath dyspnea    with exertion   SOB (shortness of breath) on exertion 06/23/2017   Status post total replacement  of right hip 03/25/2015    Family History  Problem Relation Age of Onset   Pulmonary embolism Neg Hx     Past Surgical History:  Procedure Laterality Date   ABDOMINAL HYSTERECTOMY     ANKLE FRACTURE SURGERY Right 2003   CATARACT EXTRACTION W/ INTRAOCULAR LENS  IMPLANT, BILATERAL Bilateral    FRACTURE SURGERY     INSERT / REPLACE / REMOVE PACEMAKER  07/07/2017   JOINT REPLACEMENT     LAPAROSCOPIC CHOLECYSTECTOMY     PACEMAKER IMPLANT N/A 07/07/2017   Procedure: PACEMAKER IMPLANT;  Surgeon: Regan Lemming, MD;  Location: [facility];  Service: Cardiovascular;  Laterality: N/A;   TOTAL HIP ARTHROPLASTY Right 03/25/2015   Procedure: RIGHT TOTAL HIP ARTHROPLASTY ANTERIOR APPROACH;  Surgeon: Kathryne Hitch, MD;  Location: [facility] OR;  Service: Orthopedics;  Laterality: Right;   Social History   Occupational History   Not on file  Tobacco Use   Smoking status: Never   Smokeless tobacco: Never  Vaping Use   Vaping Use: Never used  Substance and Sexual Activity   Alcohol use: No   Drug use: No   Sexual activity: Not on file

## 2021-04-21 ENCOUNTER — Other Ambulatory Visit: Payer: Self-pay

## 2021-04-21 ENCOUNTER — Ambulatory Visit: Payer: Medicare Other | Attending: Orthopaedic Surgery | Admitting: Physical Therapy

## 2021-04-21 ENCOUNTER — Encounter: Payer: Self-pay | Admitting: Physical Therapy

## 2021-04-21 DIAGNOSIS — M25661 Stiffness of right knee, not elsewhere classified: Secondary | ICD-10-CM | POA: Diagnosis not present

## 2021-04-21 DIAGNOSIS — M25671 Stiffness of right ankle, not elsewhere classified: Secondary | ICD-10-CM | POA: Diagnosis not present

## 2021-04-21 DIAGNOSIS — G8929 Other chronic pain: Secondary | ICD-10-CM | POA: Insufficient documentation

## 2021-04-21 DIAGNOSIS — M6281 Muscle weakness (generalized): Secondary | ICD-10-CM | POA: Insufficient documentation

## 2021-04-21 DIAGNOSIS — M25561 Pain in right knee: Secondary | ICD-10-CM | POA: Insufficient documentation

## 2021-04-21 DIAGNOSIS — R262 Difficulty in walking, not elsewhere classified: Secondary | ICD-10-CM | POA: Diagnosis not present

## 2021-04-21 DIAGNOSIS — M25571 Pain in right ankle and joints of right foot: Secondary | ICD-10-CM | POA: Insufficient documentation

## 2021-04-21 NOTE — Therapy (Signed)
[facility] [address] [place], [place], [address] Phone: [phone]   Fax:  [phone]  Physical Therapy Evaluation  Patient Details  Name: Lindsey Ross MRN: [id number] Date of Birth: May 13, 1982 Referring Provider (PT): Kathryne Hitch, MD   Encounter Date: 04/21/2021   PT End of Session - 04/21/21 1117     Visit Number 1    Number of Visits 17    Date for PT Re-Evaluation 06/16/21    Authorization Type Medicare & [employer]etna    PT Start Time [phone]    PT Stop Time 1015    PT Time Calculation (min) 44 min    Activity Tolerance Patient tolerated treatment well;Patient limited by pain    Behavior During Therapy [facility] for tasks assessed/performed             Past Medical History:  Diagnosis Date   Acute bilateral low back pain without sciatica 08/31/2016   Acute deep vein thrombosis (DVT) of left tibial vein (HCC) 06/26/2017   Acute pain of right wrist 08/31/2016   Aortic atherosclerosis (HCC) 06/25/2017   Arthritis    "right ankle; maybe some in her back" (07/07/2017)   Bradyarrhythmia 06/23/2017   Burning sensation of feet    Bilateral feet   Closed fracture distal radius and ulna, right, initial encounter 08/31/2016   DVT (deep venous thrombosis) (HCC) 06/24/2017   LLE   Essential hypertension 06/23/2017   Frequent urination    Heart block AV second degree 06/24/2017   Hypertension    Osteoarthritis of right hip 03/25/2015   Pacemaker 01/13/2018   Presence of permanent cardiac pacemaker    Pulmonary embolism (HCC) 06/24/2017   Pulmonary hypertension, unspecified (HCC) 12/28/2017   70 mmHg by echo in October 2018   Shortness of breath dyspnea    with exertion   SOB (shortness of breath) on exertion 06/23/2017   Status post total replacement of right hip 03/25/2015    Past Surgical History:  Procedure Laterality Date   ABDOMINAL HYSTERECTOMY     ANKLE FRACTURE SURGERY Right 2003   CATARACT EXTRACTION W/  INTRAOCULAR LENS  IMPLANT, BILATERAL Bilateral    FRACTURE SURGERY     INSERT / REPLACE / REMOVE PACEMAKER  07/07/2017   JOINT REPLACEMENT     LAPAROSCOPIC CHOLECYSTECTOMY     PACEMAKER IMPLANT N/A 07/07/2017   Procedure: PACEMAKER IMPLANT;  Surgeon: Regan Lemming, MD;  Location: [facility];  Service: Cardiovascular;  Laterality: N/A;   TOTAL HIP ARTHROPLASTY Right 03/25/2015   Procedure: RIGHT TOTAL HIP ARTHROPLASTY ANTERIOR APPROACH;  Surgeon: Kathryne Hitch, MD;  Location: [facility];  Service: Orthopedics;  Laterality: Right;    There were no vitals filed for this visit.    Subjective Assessment - 04/21/21 0933     Subjective Patient reports R knee pain off and on for a while, but has been really bad in the past 6 months. Denies inciting injury. Pain is grossly located over the anterior knee. However, pain is currently improved d/t receiving an injection to the R knee and ankle last week. Daughter reports that patient's knee pain sometimes radiates to the ankle. Reports gross R ankle pain d/t hx of fx surgery, which radiates up towards the knee and shin. Denies N/T. Had PT after ankle surgery. Pain worse with sitting, car transfers, any position where her knee is bent, standing, putting her shoe on. Better with injection, Voltaren. Using [facility] when going out at baseline.  Patient is accompained by: Family member   daughter   Pertinent History L tibial DVT 2018, bradyarrythmia, R radius-ulna fx 2018, HTN, 2nd deg AV heart block, pacemaker, pulmonary HTN, R THA 2016, R ankle fx surgery 2003    Limitations Sitting;Lifting;Standing;Walking;House hold activities    How long can you sit comfortably? unsure    How long can you stand comfortably? 30 min    How long can you walk comfortably? 30 min    Diagnostic tests 04/15/21 R knee xray: severe tricompartment arthritis with valgus   malalignment.  There is complete joint space narrowing and loss of the   lateral compartment and  osteophytes in all 3 compartments; 04/15/21 R ankle xray: retained hardware from fixation of a   trimalleolar ankle fracture dislocation.  The bone is osteopenic.  There   is posttraumatic arthritis of the ankle joint.    Patient Stated Goals decrease pain    Currently in Pain? Yes    Pain Score 0-No pain    Pain Location Knee    Pain Orientation Right;Anterior    Pain Descriptors / Indicators Aching    Pain Type Chronic pain    Multiple Pain Sites Yes    Pain Score 0    Pain Location Ankle    Pain Orientation Right    Pain Descriptors / Indicators Aching   stiff   Pain Type Chronic pain                [facility] PT Assessment - 04/21/21 0940       Assessment   Medical Diagnosis Chronic pain of right knee    Referring Provider (PT) Kathryne Hitch, MD    Onset Date/Surgical Date 10/20/20    Next MD Visit 05/13/21    Prior Therapy yes- s/p R ankle surgery      Precautions   Precautions ICD/Pacemaker   no e-stim/US     Balance Screen   Has the patient fallen in the past 6 months No    Has the patient had a decrease in activity level because of a fear of falling?  Yes    Is the patient reluctant to leave their home because of a fear of falling?  Yes      Home Environment   Living Environment Private residence    Living Arrangements Alone   husband recently passed   Available Help at Discharge Family    Type of Home House    Home Access Stairs to enter    Entrance Stairs-Number of Steps 2    Entrance Stairs-Rails Right   not for the whole way   Home Layout Laundry or work area in basement   12 with 2 handrails   Home Equipment [place] - single point;Walker - 2 wheels;Grab bars - toilet;Grab bars - tub/shower      Prior Function   Level of Independence Independent    Vocation Retired    Leisure going outside      [employer]   Overall Cognitive Status Within Functional Limits for tasks assessed      Sensation   Light Touch Appears Intact      Posture/Postural  Control   Posture/Postural Control Postural limitations    Postural Limitations Rounded Shoulders;Forward head;Increased thoracic kyphosis   scoliosis with R trunk/shoulder lean   Posture Comments genu valgum R      ROM / Strength   AROM / PROM / Strength AROM;Strength;PROM      AROM   AROM Assessment Site  Ankle;Knee    Right/Left Knee Right;Left    Right Knee Extension 5    Right Knee Flexion 58    Left Knee Extension 2    Left Knee Flexion 127    Right/Left Ankle Right;Left    Right Ankle Dorsiflexion 0    Right Ankle Plantar Flexion 61    Right Ankle Inversion 30    Right Ankle Eversion 6      PROM   PROM Assessment Site Knee    Right/Left Knee Right;Left    Right Knee Extension 5    Right Knee Flexion 78   pain   Left Knee Extension 2    Left Knee Flexion 122      Strength   Overall Strength Comments performed in sitting; some c/o R knee and ankle pain    Strength Assessment Site Hip;Knee;Ankle    Right/Left Hip Right;Left    Right Hip Flexion 4-/5    Right Hip ABduction 4/5    Right Hip ADduction 4/5    Left Hip Flexion 4+/5    Left Hip ABduction 4/5    Left Hip ADduction 4/5    Right/Left Knee Right;Left    Right Knee Flexion 3+/5    Right Knee Extension 4-/5    Left Knee Flexion 4/5    Left Knee Extension 4+/5    Right/Left Ankle Right;Left    Right Ankle Dorsiflexion 4/5    Right Ankle Plantar Flexion 2+/5    Right Ankle Inversion 4/5    Right Ankle Eversion 3+/5    Left Ankle Dorsiflexion 4+/5    Left Ankle Plantar Flexion 4/5    Left Ankle Inversion 4+/5    Left Ankle Eversion 4/5      Palpation   Patella mobility very hypomobile all directions R knee    Palpation comment edema over medial aspect of R knee and grossly edematous over R ankle; TTP slightly over lateral malleolus      Ambulation/Gait   Assistive device Straight cane    Gait Pattern Step-to pattern;Step-through pattern;Decreased dorsiflexion - right;Decreased dorsiflexion -  left;Decreased hip/knee flexion - right;Right flexed knee in stance;Trunk flexed;Poor foot clearance - left;Poor foot clearance - right    Ambulation Surface Level;Indoor    Gait velocity decreased                        Objective measurements completed on examination: See above findings.               PT Education - 04/21/21 1116     Education Details prognosis, POC, HEP; answered patient and daughter's questions about typical rehab course and clinic dynamics    Person(s) Educated Patient    Methods Explanation;Demonstration;Tactile cues;Verbal cues;Handout    Comprehension Verbalized understanding              PT Short Term Goals - 04/21/21 1123       PT SHORT TERM GOAL #1   Title Patient to be independent with initial HEP.    Time 3    Period Weeks    Status New    Target Date 05/12/21               PT Long Term Goals - 04/21/21 1124       PT LONG TERM GOAL #1   Title Patient to be independent with advanced HEP.    Time 8    Period Weeks    Status New    Target  Date 06/16/21      PT LONG TERM GOAL #2   Title Patient to demonstrate knee and ankle AROM WFL and without pain limiting.    Time 8    Period Weeks    Status New    Target Date 06/16/21      PT LONG TERM GOAL #3   Title Patient to demonstrate B LE strength >/=4/5.    Time 8    Period Weeks    Status New    Target Date 06/16/21      PT LONG TERM GOAL #4   Title Patient to complete TUG in <14 sec with LRAD in order to decrease risk of falls.    Time 8    Period Weeks    Status New    Target Date 06/16/21      PT LONG TERM GOAL #5   Title Patient to report tolerance for walking to the mailbox without pain limiting.    Time 8    Period Weeks    Status New    Target Date 06/16/21                    Plan - 04/21/21 1117     Clinical Impression Statement Patient is a 87 y/o F presenting to OPPT with c/o chronic R knee pain worsening for the past 6  months. Pain is currently improved from receiving an injection to the R knee and ankle last week. In general, patient reports diffuse R anterior knee and ankle pain without N/T. Worse with sitting, car transfers, knee flexion, standing, donning shoe. Of note, patient with remote hx of R ankle fx surgery in 2003. Patient would like to improve her tolerance for ADLs and general mobility to improve her quality of life. Patient today presenting with rounded posture and R genu valgum, limited R knee and ankle ROM, R>L LE weakness, R patellar hypomobility, R knee and ankle edema, and gait deviations. Patient was educated on gentle stretching and strengthening HEP and reported understanding. Would benefit from skilled PT services 2x/week for 8 weeks to address aforementioned impairments.    Personal Factors and Comorbidities Age;Fitness;Comorbidity 3+;Transportation;Time since onset of injury/illness/exacerbation;Past/Current Experience    Comorbidities L tibial DVT 2018, bradyarrythmia, R radius-ulna fx 2018, HTN, 2nd deg AV heart block, pacemaker, pulmonary HTN, R THA 2016, R ankle fx surgery 2003    Examination-Activity Limitations Bathing;Locomotion Level;Transfers;Reach Overhead;Bed Mobility;Bend;Sit;Carry;Squat;Sleep;Stairs;Dressing;Hygiene/Grooming;Stand;Toileting;Lift    Examination-Participation Restrictions Laundry;Shop;Community Activity;Cleaning;Church;Meal Prep    Stability/Clinical Decision Making Stable/Uncomplicated    Clinical Decision Making Low    Rehab Potential Good    PT Frequency 2x / week    PT Duration 8 weeks    PT Treatment/Interventions ADLs/Self Care Home Management;Cryotherapy;DME Instruction;Moist Heat;Iontophoresis 4mg /ml Dexamethasone;Gait training;Stair training;Functional mobility training;Therapeutic activities;Therapeutic exercise;Balance training;Neuromuscular re-education;Manual techniques;Patient/family education;Passive range of motion;Dry needling;Energy  conservation;Vasopneumatic Device;Taping    PT Next Visit Plan TUG; reassess HEP; progress R knee and ankle ROM to tolerance; LE strengthening    Consulted and Agree with Plan of Care Patient;Family member/caregiver    Family Member Consulted daughter             Patient will benefit from skilled therapeutic intervention in order to improve the following deficits and impairments:  Abnormal gait, Decreased range of motion, Difficulty walking, Increased fascial restricitons, Decreased endurance, Decreased activity tolerance, Pain, Decreased balance, Hypomobility, Impaired flexibility, Improper body mechanics, Postural dysfunction, Decreased strength, Increased edema  Visit Diagnosis: Chronic pain of right knee  Stiffness of right  knee, not elsewhere classified  Pain in right ankle and joints of right foot  Stiffness of right ankle, not elsewhere classified  Muscle weakness (generalized)  Difficulty in walking, not elsewhere classified     Problem List Patient Active Problem List   Diagnosis Date Noted   Hypertension    Presence of permanent cardiac pacemaker    Frequent urination    Burning sensation of feet    Arthritis    Pacemaker 01/13/2018   Pulmonary hypertension, unspecified (HCC) 12/28/2017   Acute deep vein thrombosis (DVT) of left tibial vein (HCC) 06/26/2017   Aortic atherosclerosis (HCC) 06/25/2017   Pulmonary embolism (HCC) 06/24/2017   Heart block AV second degree 06/24/2017   DVT (deep venous thrombosis) (HCC) 06/24/2017   SOB (shortness of breath) on exertion 06/23/2017   Bradyarrhythmia 06/23/2017   Essential hypertension 06/23/2017   Acute pain of right wrist 08/31/2016   Acute bilateral low back pain without sciatica 08/31/2016   Closed fracture distal radius and ulna, right, initial encounter 08/31/2016   Osteoarthritis of right hip 03/25/2015   Status post total replacement of right hip 03/25/2015    Anette Guarneri, PT, DPT 04/21/21 11:29  AM    [facility] [address] [place], [place], [address] Phone: [phone]   Fax:  [phone]  Name: JEMIMAH CRESSY MRN: [id number] Date of Birth: 1925/06/15

## 2021-04-21 NOTE — Patient Instructions (Signed)
Access Code: [facility] URL: [email or web address] Date: 04/21/2021 Prepared by: Georgina Peer  Exercises Supine Quad Set - 2 x daily - 7 x weekly - 2 sets - 10 reps - 5 sec hold Supine Active Straight Leg Raise - 2 x daily - 7 x weekly - 2 sets - 10 reps Seated Hamstring Curl with Anchored Resistance - 2 x daily - 7 x weekly - 2 sets - 10 reps Ankle Inversion Eversion Towel Slide - 2 x daily - 7 x weekly - 2 sets - 10 reps Seated Heel Raise - 2 x daily - 7 x weekly - 2 sets - 10 reps Seated Gastroc Stretch with Strap - 2 x daily - 7 x weekly - 2 sets - 30 sec hold

## 2021-04-23 ENCOUNTER — Ambulatory Visit: Payer: Medicare Other | Attending: Orthopaedic Surgery | Admitting: Physical Therapy

## 2021-04-23 ENCOUNTER — Other Ambulatory Visit: Payer: Self-pay

## 2021-04-23 ENCOUNTER — Encounter: Payer: Self-pay | Admitting: Physical Therapy

## 2021-04-23 DIAGNOSIS — M6281 Muscle weakness (generalized): Secondary | ICD-10-CM | POA: Insufficient documentation

## 2021-04-23 DIAGNOSIS — M25561 Pain in right knee: Secondary | ICD-10-CM | POA: Insufficient documentation

## 2021-04-23 DIAGNOSIS — G8929 Other chronic pain: Secondary | ICD-10-CM | POA: Diagnosis not present

## 2021-04-23 DIAGNOSIS — M25571 Pain in right ankle and joints of right foot: Secondary | ICD-10-CM | POA: Diagnosis not present

## 2021-04-23 DIAGNOSIS — M25661 Stiffness of right knee, not elsewhere classified: Secondary | ICD-10-CM | POA: Insufficient documentation

## 2021-04-23 DIAGNOSIS — M25671 Stiffness of right ankle, not elsewhere classified: Secondary | ICD-10-CM | POA: Diagnosis not present

## 2021-04-23 DIAGNOSIS — R262 Difficulty in walking, not elsewhere classified: Secondary | ICD-10-CM | POA: Insufficient documentation

## 2021-04-23 NOTE — Therapy (Signed)
[facility] [place] [address]. [place], [place], [address] Phone: [phone]   Fax:  [phone]  Physical Therapy Treatment  Patient Details  Name: Lindsey Ross MRN: [id number] Date of Birth: [birth year] Referring Provider (PT): Kathryne Hitch, MD   Encounter Date: 04/23/2021   PT End of Session - 04/23/21 1145     Visit Number 2    Date for PT Re-Evaluation 06/16/21    Authorization Type Medicare & Aetna    PT Start Time 1100    PT Stop Time 1145    PT Time Calculation (min) 45 min    Activity Tolerance Patient tolerated treatment well    Behavior During Therapy [facility] for tasks assessed/performed             Past Medical History:  Diagnosis Date   Acute bilateral low back pain without sciatica 08/31/2016   Acute deep vein thrombosis (DVT) of left tibial vein (HCC) 06/26/2017   Acute pain of right wrist 08/31/2016   Aortic atherosclerosis (HCC) 06/25/2017   Arthritis    "right ankle; maybe some in her back" (07/07/2017)   Bradyarrhythmia 06/23/2017   Burning sensation of feet    Bilateral feet   Closed fracture distal radius and ulna, right, initial encounter 08/31/2016   DVT (deep venous thrombosis) (HCC) 06/24/2017   LLE   Essential hypertension 06/23/2017   Frequent urination    Heart block AV second degree 06/24/2017   Hypertension    Osteoarthritis of right hip 03/25/2015   Pacemaker 01/13/2018   Presence of permanent cardiac pacemaker    Pulmonary embolism (HCC) 06/24/2017   Pulmonary hypertension, unspecified (HCC) 12/28/2017   70 mmHg by echo in October 2018   Shortness of breath dyspnea    with exertion   SOB (shortness of breath) on exertion 06/23/2017   Status post total replacement of right hip 03/25/2015    Past Surgical History:  Procedure Laterality Date   ABDOMINAL HYSTERECTOMY     ANKLE FRACTURE SURGERY Right 2003   CATARACT EXTRACTION W/ INTRAOCULAR LENS  IMPLANT, BILATERAL Bilateral    FRACTURE  SURGERY     INSERT / REPLACE / REMOVE PACEMAKER  07/07/2017   JOINT REPLACEMENT     LAPAROSCOPIC CHOLECYSTECTOMY     PACEMAKER IMPLANT N/A 07/07/2017   Procedure: PACEMAKER IMPLANT;  Surgeon: Regan Lemming, MD;  [facility];  Service: Cardiovascular;  Laterality: N/A;   TOTAL HIP ARTHROPLASTY Right 03/25/2015   Procedure: RIGHT TOTAL HIP ARTHROPLASTY ANTERIOR APPROACH;  Surgeon: Kathryne Hitch, MD;  Location: [facility] OR;  Service: Orthopedics;  Laterality: Right;    There were no vitals filed for this visit.   Subjective Assessment - 04/23/21 1104     Subjective Pain comes and goes, pain has seem to come back after doing the exercises, pain was improving after injections    Currently in Pain? Yes    Pain Score 3     Pain Location Knee    Pain Orientation Right                [facility] PT Assessment - 04/23/21 0001       Balance   Balance Assessed Yes      Standardized Balance Assessment   Standardized Balance Assessment Timed Up and Go Test      Timed Up and Go Test   Normal TUG (seconds) 27.05   no AD   TUG Comments 20.54   SPC  [facility] Adult PT Treatment/Exercise - 04/23/21 0001       Exercises   Exercises Knee/Hip      Knee/Hip Exercises: Standing   Other Standing Knee Exercises Alt box tap 4in x10, Hip abd and Ext 2x5 with RW    Other Standing Knee Exercises Standing marches SPC 2x10, Hip Abd RW 2x5 each      Knee/Hip Exercises: Seated   Long Arc Quad Right;Strengthening;10 reps;2 sets    Ball Squeeze 2x10    Abduction/Adduction  Strengthening;Both;2 sets;10 reps    Abd/Adduction Limitations blue band      Manual Therapy   Manual Therapy Passive ROM    Passive ROM R knee                      PT Short Term Goals - 04/23/21 1155       PT SHORT TERM GOAL #1   Title Patient to be independent with initial HEP.    Status Achieved               PT Long Term Goals -  04/23/21 1155       PT LONG TERM GOAL #1   Title Patient to be independent with advanced HEP.    Status On-going      PT LONG TERM GOAL #2   Title Patient to demonstrate knee and ankle AROM WFL and without pain limiting.    Status On-going                   Plan - 04/23/21 1151     Clinical Impression Statement Pt tolerated an initial progression to TE well. She enters clinic reporting more pain for the first time since receiving injection, pt questioned if the exercises made the injection to stop working.  Some R quad fatigue reported with seated LAQ. Pt ambulated around clinic with SPC in RUE despite have R knee and ankle issues. Pt instructed on proper gait pattern with the SPC in LLE with help support RLE. Pt stated her granddaughter is a DPT and has been telling her the same thins but she feels better with SPC in the R. Increase in fatigue noted with the standing interventions. SBA to CGA needed with alt box taps and standing marches.    Personal Factors and Comorbidities Age;Fitness;Comorbidity 3+;Transportation;Time since onset of injury/illness/exacerbation;Past/Current Experience    Comorbidities L tibial DVT 2018, bradyarrythmia, R radius-ulna fx 2018, HTN, 2nd deg AV heart block, pacemaker, pulmonary HTN, R THA 2016, R ankle fx surgery 2003    Examination-Activity Limitations Bathing;Locomotion Level;Transfers;Reach Overhead;Bed Mobility;Bend;Sit;Carry;Squat;Sleep;Stairs;Dressing;Hygiene/Grooming;Stand;Toileting;Lift    Examination-Participation Restrictions Laundry;Shop;Community Activity;Cleaning;Church;Meal Prep    Stability/Clinical Decision Making Stable/Uncomplicated    Rehab Potential Good    PT Frequency 2x / week    PT Treatment/Interventions ADLs/Self Care Home Management;Cryotherapy;DME Instruction;Moist Heat;Iontophoresis 4mg /ml Dexamethasone;Gait training;Stair training;Functional mobility training;Therapeutic activities;Therapeutic exercise;Balance  training;Neuromuscular re-education;Manual techniques;Patient/family education;Passive range of motion;Dry needling;Energy conservation;Vasopneumatic Device;Taping    PT Next Visit Plan progress R knee and ankle ROM to tolerance; LE strengthening             Patient will benefit from skilled therapeutic intervention in order to improve the following deficits and impairments:  Abnormal gait, Decreased range of motion, Difficulty walking, Increased fascial restricitons, Decreased endurance, Decreased activity tolerance, Pain, Decreased balance, Hypomobility, Impaired flexibility, Improper body mechanics, Postural dysfunction, Decreased strength, Increased edema  Visit Diagnosis: Stiffness of right ankle, not elsewhere classified  Chronic pain of right knee  Pain in  right ankle and joints of right foot  Muscle weakness (generalized)  Stiffness of right knee, not elsewhere classified     Problem List Patient Active Problem List   Diagnosis Date Noted   Hypertension    Presence of permanent cardiac pacemaker    Frequent urination    Burning sensation of feet    Arthritis    Pacemaker 01/13/2018   Pulmonary hypertension, unspecified (HCC) 12/28/2017   Acute deep vein thrombosis (DVT) of left tibial vein (HCC) 06/26/2017   Aortic atherosclerosis (HCC) 06/25/2017   Pulmonary embolism (HCC) 06/24/2017   Heart block AV second degree 06/24/2017   DVT (deep venous thrombosis) (HCC) 06/24/2017   SOB (shortness of breath) on exertion 06/23/2017   Bradyarrhythmia 06/23/2017   Essential hypertension 06/23/2017   Acute pain of right wrist 08/31/2016   Acute bilateral low back pain without sciatica 08/31/2016   Closed fracture distal radius and ulna, right, initial encounter 08/31/2016   Osteoarthritis of right hip 03/25/2015   Status post total replacement of right hip 03/25/2015    Grayce Sessions 04/23/2021, 11:57 AM  [facility] [place]  [facility] [address] [place], [place], [address] Phone: [phone]   Fax:  [phone]  Name: Lindsey Ross MRN: [id number] Date of Birth: 1925-06-05

## 2021-04-29 NOTE — Progress Notes (Signed)
Remote pacemaker transmission.   

## 2021-05-01 ENCOUNTER — Ambulatory Visit: Payer: Medicare Other | Admitting: Physical Therapy

## 2021-05-01 ENCOUNTER — Encounter: Payer: Self-pay | Admitting: Physical Therapy

## 2021-05-01 ENCOUNTER — Other Ambulatory Visit: Payer: Self-pay

## 2021-05-01 DIAGNOSIS — M25671 Stiffness of right ankle, not elsewhere classified: Secondary | ICD-10-CM | POA: Diagnosis not present

## 2021-05-01 DIAGNOSIS — M25661 Stiffness of right knee, not elsewhere classified: Secondary | ICD-10-CM | POA: Diagnosis not present

## 2021-05-01 DIAGNOSIS — G8929 Other chronic pain: Secondary | ICD-10-CM

## 2021-05-01 DIAGNOSIS — M25561 Pain in right knee: Secondary | ICD-10-CM | POA: Diagnosis not present

## 2021-05-01 DIAGNOSIS — M25571 Pain in right ankle and joints of right foot: Secondary | ICD-10-CM

## 2021-05-01 DIAGNOSIS — M6281 Muscle weakness (generalized): Secondary | ICD-10-CM | POA: Diagnosis not present

## 2021-05-01 NOTE — Therapy (Signed)
[facility] [place] [facility] [address] [place], [place], [address] Phone: [phone]   Fax:  [phone]  Physical Therapy Treatment  Patient Details  Name: Lindsey Ross MRN: [id number] Date of Birth: 05-Nov-1924 Referring Provider (PT): Kathryne Hitch, MD   Encounter Date: 05/01/2021   PT End of Session - 05/01/21 1016     Visit Number 3    Date for PT Re-Evaluation 06/16/21    Authorization Type Medicare & Aetna    PT Start Time 0930    PT Stop Time 1015    PT Time Calculation (min) 45 min    Activity Tolerance Patient tolerated treatment well    Behavior During Therapy [facility] for tasks assessed/performed             Past Medical History:  Diagnosis Date   Acute bilateral low back pain without sciatica 08/31/2016   Acute deep vein thrombosis (DVT) of left tibial vein (HCC) 06/26/2017   Acute pain of right wrist 08/31/2016   Aortic atherosclerosis (HCC) 06/25/2017   Arthritis    "right ankle; maybe some in her back" (07/07/2017)   Bradyarrhythmia 06/23/2017   Burning sensation of feet    Bilateral feet   Closed fracture distal radius and ulna, right, initial encounter 08/31/2016   DVT (deep venous thrombosis) (HCC) 06/24/2017   LLE   Essential hypertension 06/23/2017   Frequent urination    Heart block AV second degree 06/24/2017   Hypertension    Osteoarthritis of right hip 03/25/2015   Pacemaker 01/13/2018   Presence of permanent cardiac pacemaker    Pulmonary embolism (HCC) 06/24/2017   Pulmonary hypertension, unspecified (HCC) 12/28/2017   70 mmHg by echo in October 2018   Shortness of breath dyspnea    with exertion   SOB (shortness of breath) on exertion 06/23/2017   Status post total replacement of right hip 03/25/2015    Past Surgical History:  Procedure Laterality Date   ABDOMINAL HYSTERECTOMY     ANKLE FRACTURE SURGERY Right 2003   CATARACT EXTRACTION W/ INTRAOCULAR LENS  IMPLANT, BILATERAL Bilateral    FRACTURE  SURGERY     INSERT / REPLACE / REMOVE PACEMAKER  07/07/2017   JOINT REPLACEMENT     LAPAROSCOPIC CHOLECYSTECTOMY     PACEMAKER IMPLANT N/A 07/07/2017   Procedure: PACEMAKER IMPLANT;  Surgeon: Regan Lemming, MD;  [facility];  Service: Cardiovascular;  Laterality: N/A;   TOTAL HIP ARTHROPLASTY Right 03/25/2015   Procedure: RIGHT TOTAL HIP ARTHROPLASTY ANTERIOR APPROACH;  Surgeon: Kathryne Hitch, MD;  [facility];  Service: Orthopedics;  Laterality: Right;    There were no vitals filed for this visit.   Subjective Assessment - 05/01/21 0931     Subjective Been doing ok    Pertinent History L tibial DVT 2018, bradyarrythmia, R radius-ulna fx 2018, HTN, 2nd deg AV heart block, pacemaker, pulmonary HTN, R THA 2016, R ankle fx surgery 2003    Currently in Pain? Yes    Pain Score 5     Pain Location Knee    Pain Orientation Right                               [facility] Adult PT Treatment/Exercise - 05/01/21 0001       Knee/Hip Exercises: Aerobic   Nustep L1 x 5 min      Knee/Hip Exercises: Standing   Forward Step Up  Both;3 sets;5 sets;Step Height: 4"   Using RW   Other Standing Knee Exercises Alt box tap 4in x10, Hip abd and Ext 2x5 with RW      Knee/Hip Exercises: Seated   Long Arc Quad Right;Strengthening;10 reps;2 sets    [employer]way Weight 3 lbs.    Ball Squeeze 2x10   manual resistance   Marching Strengthening;Both;2 sets;10 reps    Marching Weights 3 lbs.    Abduction/Adduction  Strengthening;Both;2 sets;10 reps    Abd/Adduction Limitations manual resistance                       PT Short Term Goals - 04/23/21 1155       PT SHORT TERM GOAL #1   Title Patient to be independent with initial HEP.    Status Achieved               PT Long Term Goals - 04/23/21 1155       PT LONG TERM GOAL #1   Title Patient to be independent with advanced HEP.    Status On-going      PT LONG TERM GOAL #2    Title Patient to demonstrate knee and ankle AROM WFL and without pain limiting.    Status On-going                   Plan - 05/01/21 1017     Clinical Impression Statement Pt did well in today's treatment session. Cue needed to hole quad contraction with seated LAQ. Cue to control the eccentric phase of hamstring curls. She did well progressing to step ups with RW. Cue not to brace up against mat table to alt box taps. CGA needed with alt box tap at times.    Personal Factors and Comorbidities Age;Fitness;Comorbidity 3+;Transportation;Time since onset of injury/illness/exacerbation;Past/Current Experience    Comorbidities L tibial DVT 2018, bradyarrythmia, R radius-ulna fx 2018, HTN, 2nd deg AV heart block, pacemaker, pulmonary HTN, R THA 2016, R ankle fx surgery 2003    Examination-Activity Limitations Bathing;Locomotion Level;Transfers;Reach Overhead;Bed Mobility;Bend;Sit;Carry;Squat;Sleep;Stairs;Dressing;Hygiene/Grooming;Stand;Toileting;Lift    Stability/Clinical Decision Making Stable/Uncomplicated    Rehab Potential Good    PT Frequency 2x / week    PT Treatment/Interventions ADLs/Self Care Home Management;Cryotherapy;DME Instruction;Moist Heat;Iontophoresis 4mg /ml Dexamethasone;Gait training;Stair training;Functional mobility training;Therapeutic activities;Therapeutic exercise;Balance training;Neuromuscular re-education;Manual techniques;Patient/family education;Passive range of motion;Dry needling;Energy conservation;Vasopneumatic Device;Taping    PT Next Visit Plan progress R knee and ankle ROM to tolerance; LE strengthening    PT Home Exercise Plan Access Code:  URL: [email or web address]  Date: 05/01/2021  Prepared by: 07/01/2021    Exercises  Seated Long Arc Quad - 1 x daily - 7 x weekly - 3 sets - 10 reps  Seated March - 1 x daily - 7 x weekly - 3 sets - 10 reps             Patient will benefit from skilled therapeutic intervention in order  to improve the following deficits and impairments:  Abnormal gait, Decreased range of motion, Difficulty walking, Increased fascial restricitons, Decreased endurance, Decreased activity tolerance, Pain, Decreased balance, Hypomobility, Impaired flexibility, Improper body mechanics, Postural dysfunction, Decreased strength, Increased edema  Visit Diagnosis: Stiffness of right ankle, not elsewhere classified  Chronic pain of right knee  Pain in right ankle and joints of right foot  Muscle weakness (generalized)     Problem List Patient Active Problem List   Diagnosis Date Noted   Hypertension    Presence  of permanent cardiac pacemaker    Frequent urination    Burning sensation of feet    Arthritis    Pacemaker 01/13/2018   Pulmonary hypertension, unspecified (HCC) 12/28/2017   Acute deep vein thrombosis (DVT) of left tibial vein (HCC) 06/26/2017   Aortic atherosclerosis (HCC) 06/25/2017   Pulmonary embolism (HCC) 06/24/2017   Heart block AV second degree 06/24/2017   DVT (deep venous thrombosis) (HCC) 06/24/2017   SOB (shortness of breath) on exertion 06/23/2017   Bradyarrhythmia 06/23/2017   Essential hypertension 06/23/2017   Acute pain of right wrist 08/31/2016   Acute bilateral low back pain without sciatica 08/31/2016   Closed fracture distal radius and ulna, right, initial encounter 08/31/2016   Osteoarthritis of right hip 03/25/2015   Status post total replacement of right hip 03/25/2015    Grayce Sessions, PTA 05/01/2021, 10:20 AM  [facility] [address] [place], [place], [address] Phone: [phone]   Fax:  [phone]  Name: DESHAUN SCHOU MRN: [id number] Date of Birth: 02/19/1925

## 2021-05-04 ENCOUNTER — Ambulatory Visit: Payer: Medicare Other | Admitting: Physical Therapy

## 2021-05-04 ENCOUNTER — Other Ambulatory Visit: Payer: Self-pay

## 2021-05-04 DIAGNOSIS — M25671 Stiffness of right ankle, not elsewhere classified: Secondary | ICD-10-CM | POA: Diagnosis not present

## 2021-05-04 DIAGNOSIS — R262 Difficulty in walking, not elsewhere classified: Secondary | ICD-10-CM

## 2021-05-04 DIAGNOSIS — M25571 Pain in right ankle and joints of right foot: Secondary | ICD-10-CM

## 2021-05-04 DIAGNOSIS — G8929 Other chronic pain: Secondary | ICD-10-CM | POA: Diagnosis not present

## 2021-05-04 DIAGNOSIS — M25661 Stiffness of right knee, not elsewhere classified: Secondary | ICD-10-CM | POA: Diagnosis not present

## 2021-05-04 DIAGNOSIS — M25561 Pain in right knee: Secondary | ICD-10-CM | POA: Diagnosis not present

## 2021-05-04 DIAGNOSIS — M6281 Muscle weakness (generalized): Secondary | ICD-10-CM | POA: Diagnosis not present

## 2021-05-04 NOTE — Therapy (Signed)
[facility] [place] [facility] [address] [place], [place], [address] Phone: [phone]   Fax:  [phone]  Physical Therapy Treatment  Patient Details  Name: Lindsey Ross MRN: [id number] Date of Birth: [birth year] Referring Provider (PT): Kathryne Hitch, MD   Encounter Date: 05/04/2021   PT End of Session - 05/04/21 1512     Visit Number 4    Date for PT Re-Evaluation 06/16/21    Authorization Type Medicare & Aetna    PT Start Time 1430    PT Stop Time 1513    PT Time Calculation (min) 43 min    Activity Tolerance Patient tolerated treatment well    Behavior During Therapy [facility] for tasks assessed/performed             Past Medical History:  Diagnosis Date   Acute bilateral low back pain without sciatica 08/31/2016   Acute deep vein thrombosis (DVT) of left tibial vein (HCC) 06/26/2017   Acute pain of right wrist 08/31/2016   Aortic atherosclerosis (HCC) 06/25/2017   Arthritis    "right ankle; maybe some in her back" (07/07/2017)   Bradyarrhythmia 06/23/2017   Burning sensation of feet    Bilateral feet   Closed fracture distal radius and ulna, right, initial encounter 08/31/2016   DVT (deep venous thrombosis) (HCC) 06/24/2017   LLE   Essential hypertension 06/23/2017   Frequent urination    Heart block AV second degree 06/24/2017   Hypertension    Osteoarthritis of right hip 03/25/2015   Pacemaker 01/13/2018   Presence of permanent cardiac pacemaker    Pulmonary embolism (HCC) 06/24/2017   Pulmonary hypertension, unspecified (HCC) 12/28/2017   70 mmHg by echo in October 2018   Shortness of breath dyspnea    with exertion   SOB (shortness of breath) on exertion 06/23/2017   Status post total replacement of right hip 03/25/2015    Past Surgical History:  Procedure Laterality Date   ABDOMINAL HYSTERECTOMY     ANKLE FRACTURE SURGERY Right 2003   CATARACT EXTRACTION W/ INTRAOCULAR LENS  IMPLANT, BILATERAL Bilateral    FRACTURE  SURGERY     INSERT / REPLACE / REMOVE PACEMAKER  07/07/2017   JOINT REPLACEMENT     LAPAROSCOPIC CHOLECYSTECTOMY     PACEMAKER IMPLANT N/A 07/07/2017   Procedure: PACEMAKER IMPLANT;  Surgeon: Regan Lemming, MD;  [facility];  Service: Cardiovascular;  Laterality: N/A;   TOTAL HIP ARTHROPLASTY Right 03/25/2015   Procedure: RIGHT TOTAL HIP ARTHROPLASTY ANTERIOR APPROACH;  Surgeon: Kathryne Hitch, MD;  [facility]: [facility];  Service: Orthopedics;  Laterality: Right;    There were no vitals filed for this visit.   Subjective Assessment - 05/04/21 1429     Subjective "Im ok"    Currently in Pain? Yes    Pain Score 5     Pain Location Knee    Pain Orientation Right                               [facility] Adult PT Treatment/Exercise - 05/04/21 0001       High Level Balance   High Level Balance Comments On airex 3 x15 sec, on airex eyes closes 2 x10''      Knee/Hip Exercises: Aerobic   Nustep L2 x 6 min      Knee/Hip Exercises: Standing   Forward Step Up Both;2 sets;5 reps;Step Height: 4";Step Height: 6"  with RW     Knee/Hip Exercises: Seated   Long Arc Quad Right;Strengthening;10 reps;2 sets    [employer] Weight 3 lbs.    Hamstring Curl Strengthening;Both;2 sets;15 reps    Hamstring Limitations green tband    Sit to Sand 3 sets;5 reps;with UE support   slightly elevated mat able                      PT Short Term Goals - 04/23/21 1155       PT SHORT TERM GOAL #1   Title Patient to be independent with initial HEP.    Status Achieved               PT Long Term Goals - 04/23/21 1155       PT LONG TERM GOAL #1   Title Patient to be independent with advanced HEP.    Status On-going      PT LONG TERM GOAL #2   Title Patient to demonstrate knee and ankle AROM WFL and without pain limiting.    Status On-going                   Plan - 05/04/21 1513     Clinical Impression Statement Pt continues  to do well in therapy. Som initial knee pain during the initial warm up but that subsided as she progressed. No issus progressing to 6 inch step up with RW. She had difficulty standing on airex with eyes close LOB x1. Cue to control the eccentric phase of hamstring curls.    Personal Factors and Comorbidities Age;Fitness;Comorbidity 3+;Transportation;Time since onset of injury/illness/exacerbation;Past/Current Experience    Comorbidities L tibial DVT 2018, bradyarrythmia, R radius-ulna fx 2018, HTN, 2nd deg AV heart block, pacemaker, pulmonary HTN, R THA 2016, R ankle fx surgery 2003    Examination-Activity Limitations Bathing;Locomotion Level;Transfers;Reach Overhead;Bed Mobility;Bend;Sit;Carry;Squat;Sleep;Stairs;Dressing;Hygiene/Grooming;Stand;Toileting;Lift    Examination-Participation Restrictions Laundry;Shop;Community Activity;Cleaning;Church;Meal Prep    Stability/Clinical Decision Making Stable/Uncomplicated    Rehab Potential Good    PT Frequency 2x / week    PT Duration 8 weeks    PT Treatment/Interventions ADLs/Self Care Home Management;Cryotherapy;DME Instruction;Moist Heat;Iontophoresis 4mg /ml Dexamethasone;Gait training;Stair training;Functional mobility training;Therapeutic activities;Therapeutic exercise;Balance training;Neuromuscular re-education;Manual techniques;Patient/family education;Passive range of motion;Dry needling;Energy conservation;Vasopneumatic Device;Taping    PT Next Visit Plan progress R knee and ankle ROM to tolerance; LE strengthening             Patient will benefit from skilled therapeutic intervention in order to improve the following deficits and impairments:  Abnormal gait, Decreased range of motion, Difficulty walking, Increased fascial restricitons, Decreased endurance, Decreased activity tolerance, Pain, Decreased balance, Hypomobility, Impaired flexibility, Improper body mechanics, Postural dysfunction, Decreased strength, Increased edema  Visit  Diagnosis: Stiffness of right ankle, not elsewhere classified  Chronic pain of right knee  Pain in right ankle and joints of right foot  Muscle weakness (generalized)  Stiffness of right knee, not elsewhere classified  Difficulty in walking, not elsewhere classified     Problem List Patient Active Problem List   Diagnosis Date Noted   Hypertension    Presence of permanent cardiac pacemaker    Frequent urination    Burning sensation of feet    Arthritis    Pacemaker 01/13/2018   Pulmonary hypertension, unspecified (HCC) 12/28/2017   Acute deep vein thrombosis (DVT) of left tibial vein (HCC) 06/26/2017   Aortic atherosclerosis (HCC) 06/25/2017   Pulmonary embolism (HCC) 06/24/2017   Heart block AV second degree 06/24/2017  DVT (deep venous thrombosis) (HCC) 06/24/2017   SOB (shortness of breath) on exertion 06/23/2017   Bradyarrhythmia 06/23/2017   Essential hypertension 06/23/2017   Acute pain of right wrist 08/31/2016   Acute bilateral low back pain without sciatica 08/31/2016   Closed fracture distal radius and ulna, right, initial encounter 08/31/2016   Osteoarthritis of right hip 03/25/2015   Status post total replacement of right hip 03/25/2015    Grayce Sessions, PTA 05/04/2021, 3:16 PM  [facility] [place] [address] [place], [place], [address] Phone: [phone]   Fax:  [phone]  Name: Lindsey Ross MRN: [id number] Date of Birth: [birth year]

## 2021-05-05 ENCOUNTER — Other Ambulatory Visit: Payer: Self-pay

## 2021-05-06 ENCOUNTER — Ambulatory Visit (INDEPENDENT_AMBULATORY_CARE_PROVIDER_SITE_OTHER): Payer: Medicare Other | Admitting: Cardiology

## 2021-05-06 ENCOUNTER — Ambulatory Visit: Payer: Medicare Other | Admitting: Physical Therapy

## 2021-05-06 ENCOUNTER — Encounter: Payer: Self-pay | Admitting: Physical Therapy

## 2021-05-06 ENCOUNTER — Encounter: Payer: Self-pay | Admitting: Cardiology

## 2021-05-06 ENCOUNTER — Other Ambulatory Visit: Payer: Self-pay

## 2021-05-06 VITALS — BP 110/62 | HR 105 | Ht 62.5 in | Wt 162.0 lb

## 2021-05-06 DIAGNOSIS — I10 Essential (primary) hypertension: Secondary | ICD-10-CM | POA: Diagnosis not present

## 2021-05-06 DIAGNOSIS — I2782 Chronic pulmonary embolism: Secondary | ICD-10-CM

## 2021-05-06 DIAGNOSIS — I441 Atrioventricular block, second degree: Secondary | ICD-10-CM | POA: Diagnosis not present

## 2021-05-06 DIAGNOSIS — M25571 Pain in right ankle and joints of right foot: Secondary | ICD-10-CM | POA: Diagnosis not present

## 2021-05-06 DIAGNOSIS — G8929 Other chronic pain: Secondary | ICD-10-CM | POA: Diagnosis not present

## 2021-05-06 DIAGNOSIS — M25561 Pain in right knee: Secondary | ICD-10-CM | POA: Diagnosis not present

## 2021-05-06 DIAGNOSIS — Z950 Presence of cardiac pacemaker: Secondary | ICD-10-CM

## 2021-05-06 DIAGNOSIS — M25671 Stiffness of right ankle, not elsewhere classified: Secondary | ICD-10-CM | POA: Diagnosis not present

## 2021-05-06 DIAGNOSIS — R262 Difficulty in walking, not elsewhere classified: Secondary | ICD-10-CM

## 2021-05-06 DIAGNOSIS — M25661 Stiffness of right knee, not elsewhere classified: Secondary | ICD-10-CM

## 2021-05-06 DIAGNOSIS — M6281 Muscle weakness (generalized): Secondary | ICD-10-CM | POA: Diagnosis not present

## 2021-05-06 NOTE — Patient Instructions (Signed)

## 2021-05-06 NOTE — Progress Notes (Signed)
Cardiology Office Note:    Date:  05/06/2021   ID:  Lindsey Ross, DOB 03/07/1925, MRN [id number]  PCP:  Chilton Greathouse, MD  Cardiologist:  Gypsy Balsam, MD    Referring MD: Chilton Greathouse, MD   Chief Complaint  Patient presents with   Follow-up    No new concerns      History of Present Illness:    Lindsey Ross is a 87 y.o. female with past medical history significant for history of pulmonary emboli, status post pacemaker implantation secondary to high blood, essential hypertension.  She comes today 2 months of follow-up overall doing well the biggest complaint of pain in the right knee and right ankle she been having some physical therapy for it.  Denies have any chest pain tightness squeezing pressure burning chest.  Shortness of breath is still there but only mild.  She does do activities of daily living without limitations  Past Medical History:  Diagnosis Date   Acute bilateral low back pain without sciatica 08/31/2016   Acute deep vein thrombosis (DVT) of left tibial vein (HCC) 06/26/2017   Acute pain of right wrist 08/31/2016   Aortic atherosclerosis (HCC) 06/25/2017   Arthritis    "right ankle; maybe some in her back" (07/07/2017)   Bradyarrhythmia 06/23/2017   Burning sensation of feet    Bilateral feet   Closed fracture distal radius and ulna, right, initial encounter 08/31/2016   DVT (deep venous thrombosis) (HCC) 06/24/2017   LLE   Essential hypertension 06/23/2017   Frequent urination    Heart block AV second degree 06/24/2017   Hypertension    Osteoarthritis of right hip 03/25/2015   Pacemaker 01/13/2018   Presence of permanent cardiac pacemaker    Pulmonary embolism (HCC) 06/24/2017   Pulmonary hypertension, unspecified (HCC) 12/28/2017   70 mmHg by echo in October 2018   Shortness of breath dyspnea    with exertion   SOB (shortness of breath) on exertion 06/23/2017   Status post total replacement of right hip 03/25/2015    Past Surgical History:   Procedure Laterality Date   ABDOMINAL HYSTERECTOMY     ANKLE FRACTURE SURGERY Right 2003   CATARACT EXTRACTION W/ INTRAOCULAR LENS  IMPLANT, BILATERAL Bilateral    FRACTURE SURGERY     INSERT / REPLACE / REMOVE PACEMAKER  07/07/2017   JOINT REPLACEMENT     LAPAROSCOPIC CHOLECYSTECTOMY     PACEMAKER IMPLANT N/A 07/07/2017   Procedure: PACEMAKER IMPLANT;  Surgeon: Regan Lemming, MD;  [facility];  Service: Cardiovascular;  Laterality: N/A;   TOTAL HIP ARTHROPLASTY Right 03/25/2015   Procedure: RIGHT TOTAL HIP ARTHROPLASTY ANTERIOR APPROACH;  Surgeon: Kathryne Hitch, MD;  Location: [facility];  Service: Orthopedics;  Laterality: Right;    Current Medications: Current Meds  Medication Sig   acetaminophen (TYLENOL) 500 MG tablet Take 1,000 mg by mouth every 6 (six) hours as needed for headache (pain).   albuterol (PROVENTIL HFA;VENTOLIN HFA) 108 (90 Base) MCG/ACT inhaler Inhale 1-2 puffs into the lungs every 6 (six) hours as needed for wheezing or shortness of breath.   apixaban (ELIQUIS) 5 MG TABS tablet Take 5 mg by mouth 2 (two) times daily.   Calcium Carb-Cholecalciferol (CALCIUM + D3 PO) Take 1 tablet by mouth daily. Unknown strength   diclofenac Sodium (VOLTAREN) 1 % GEL Apply 2 g topically 4 (four) times daily.   furosemide (LASIX) 40 MG tablet Take 1 tablet (40 mg total) by mouth daily.  gabapentin (NEURONTIN) 100 MG capsule Take 200 mg by mouth 3 (three) times daily.   Multiple Vitamins-Minerals (MULTIVITAMIN WITH MINERALS) tablet Take 1 tablet by mouth daily. Unknown strength   pantoprazole (PROTONIX) 40 MG tablet Take 40 mg by mouth daily.   telmisartan-hydrochlorothiazide (MICARDIS HCT) 80-25 MG tablet Take 1 tablet by mouth daily.     Allergies:   Patient has no known allergies.   Social History   Socioeconomic History   Marital status: Married    Spouse name: Not on file   Number of children: Not on file   Years of education: Not on file    Highest education level: Not on file  Occupational History   Not on file  Tobacco Use   Smoking status: Never   Smokeless tobacco: Never  Vaping Use   Vaping Use: Never used  Substance and Sexual Activity   Alcohol use: No   Drug use: No   Sexual activity: Not on file  Other Topics Concern   Not on file  Social History Narrative   Not on file   Social Determinants of Health   Financial Resource Strain: Not on file  Food Insecurity: Not on file  Transportation Needs: Not on file  Physical Activity: Not on file  Stress: Not on file  Social Connections: Not on file     Family History: The patient's family history is negative for Pulmonary embolism. ROS:   Please see the history of present illness.    All 14 point review of systems negative except as described per history of present illness  EKGs/Labs/Other Studies Reviewed:      Recent Labs: 02/17/2021: BUN 31; Creatinine, Ser 0.96; Potassium 4.2; Sodium 139  Recent Lipid Panel No results found for: CHOL, TRIG, HDL, CHOLHDL, VLDL, LDLCALC, LDLDIRECT  Physical Exam:    VS:  BP 110/62 (BP Location: Right Arm, Patient Position: Sitting)   Pulse (!) 105   Ht 5' 2.5" (1.588 m)   Wt 162 lb (73.5 kg)   SpO2 94%   BMI 29.16 kg/m     Wt Readings from Last 3 Encounters:  05/06/21 162 lb (73.5 kg)  02/09/21 165 lb (74.8 kg)  08/18/20 176 lb (79.8 kg)     GEN:  Well nourished, well developed in no acute distress HEENT: Normal NECK: No JVD; No carotid bruits LYMPHATICS: No lymphadenopathy CARDIAC: RRR, no murmurs, no rubs, no gallops RESPIRATORY:  Clear to auscultation without rales, wheezing or rhonchi  ABDOMEN: Soft, non-tender, non-distended MUSCULOSKELETAL:  No edema; No deformity  SKIN: Warm and dry LOWER EXTREMITIES: no swelling NEUROLOGIC:  Alert and oriented x 3 PSYCHIATRIC:  Normal affect   ASSESSMENT:    1. Essential hypertension   2. Other chronic pulmonary embolism without acute cor pulmonale (HCC)    3. Pacemaker   4. Heart block AV second degree    PLAN:    In order of problems listed above:  Essential hypertension blood pressure well controlled continue present management. History of pulmonary hypertension but overall she looks good.  Because of comorbidities and advanced age conservative approach.  She is stable Pacemaker: Normal function I did review interrogation Dyslipidemia I did review K PN which show me her LDL of 94 and HDL of 65.  This is from 01/17/2020 is a good cholesterol.  She will have follow-up with her primary care physician soon and she will get fasting lipid profile repeated.   Medication Adjustments/Labs and Tests Ordered: Current medicines are reviewed at length with the  patient today.  Concerns regarding medicines are outlined above.  No orders of the defined types were placed in this encounter.  Medication changes: No orders of the defined types were placed in this encounter.   Signed, Georgeanna Lea, MD, [facility] 05/06/2021 2:20 PM    [facility]

## 2021-05-06 NOTE — Therapy (Signed)
[place] [place], [place], [address] Phone: [phone]   Fax:  [phone]  Physical Therapy Treatment  Patient Details  Name: Lindsey Ross MRN: [id number] Date of Birth: 1925/08/16 Referring Provider (PT): Mcarthur Rossetti, MD   Encounter Date: 05/06/2021   PT End of Session - 05/06/21 0926     Visit Number 5    Date for PT Re-Evaluation 06/16/21    Authorization Type Medicare & Aetna    PT Start Time [phone]    PT Stop Time 0922    PT Time Calculation (min) 44 min    Activity Tolerance Patient tolerated treatment well    Behavior During Therapy [facility] for tasks assessed/performed             Past Medical History:  Diagnosis Date   Acute bilateral low back pain without sciatica 08/31/2016   Acute deep vein thrombosis (DVT) of left tibial vein ([place]) 06/26/2017   Acute pain of right wrist 08/31/2016   Aortic atherosclerosis ([place]) 06/25/2017   Arthritis    "right ankle; maybe some in her back" (07/07/2017)   Bradyarrhythmia 06/23/2017   Burning sensation of feet    Bilateral feet   Closed fracture distal radius and ulna, right, initial encounter 08/31/2016   DVT (deep venous thrombosis) ([place]) 06/24/2017   LLE   Essential hypertension 06/23/2017   Frequent urination    Heart block AV second degree 06/24/2017   Hypertension    Osteoarthritis of right hip 03/25/2015   Pacemaker 01/13/2018   Presence of permanent cardiac pacemaker    Pulmonary embolism ([place]) 06/24/2017   Pulmonary hypertension, unspecified ([place]) 12/28/2017   70 mmHg by echo in October 2018   Shortness of breath dyspnea    with exertion   SOB (shortness of breath) on exertion 06/23/2017   Status post total replacement of right hip 03/25/2015    Past Surgical History:  Procedure Laterality Date   ABDOMINAL HYSTERECTOMY     ANKLE FRACTURE SURGERY Right 2003   CATARACT EXTRACTION W/ INTRAOCULAR LENS  IMPLANT, BILATERAL Bilateral    FRACTURE  SURGERY     INSERT / REPLACE / REMOVE PACEMAKER  07/07/2017   JOINT REPLACEMENT     LAPAROSCOPIC CHOLECYSTECTOMY     PACEMAKER IMPLANT N/A 07/07/2017   Procedure: PACEMAKER IMPLANT;  Surgeon: Constance Haw, MD;  Location: [facility];  Service: Cardiovascular;  Laterality: N/A;   TOTAL HIP ARTHROPLASTY Right 03/25/2015   Procedure: RIGHT TOTAL HIP ARTHROPLASTY ANTERIOR APPROACH;  Surgeon: Mcarthur Rossetti, MD;  Location: [facility];  Service: Orthopedics;  Laterality: Right;    There were no vitals filed for this visit.   Subjective Assessment - 05/06/21 0839     Subjective Still hurting in the right knee and down the right shin to the ankle    Currently in Pain? Yes    Pain Score 5     Pain Location Knee    Pain Orientation Right    Aggravating Factors  walking, the exercises, bending increase the pain    Pain Relieving Factors sitting, rest pain will resolve                [facility] PT Assessment - 05/06/21 0001       Timed Up and Go Test   Normal TUG (seconds) 20   without AD  [facility] Adult PT Treatment/Exercise - 05/06/21 0001       Ambulation/Gait   Gait Comments , stairs with both hand rails step over step      High Level Balance   High Level Balance Activities Side stepping;Backward walking;Direction changes    High Level Balance Comments standing ball toss, standing on airex with head turns, very difficult with right ankle rolling under laterally, 4" toe taps      Knee/Hip Exercises: Stretches   Gastroc Stretch Both;4 reps;20 seconds      Knee/Hip Exercises: Aerobic   Nustep L2 x 6 min      Knee/Hip Exercises: Machines for Strengthening   Cybex Knee Flexion 15# 2x10      Knee/Hip Exercises: Standing   Hip Abduction Both;2 sets;10 reps    Abduction Limitations 3#      Knee/Hip Exercises: Seated   Long Arc Quad Right;Strengthening;10 reps;2 sets    Long Arc Quad Weight 3 lbs.    Other Seated Knee/Hip  Exercises red tband right ankle eversion 2x10    Marching Strengthening;Both;2 sets;10 reps    Marching Weights 3 lbs.                       PT Short Term Goals - 04/23/21 1155       PT SHORT TERM GOAL #1   Title Patient to be independent with initial HEP.    Status Achieved               PT Long Term Goals - 05/06/21 1448       PT LONG TERM GOAL #1   Title Patient to be independent with advanced HEP.    Status On-going      PT LONG TERM GOAL #2   Title Patient to demonstrate knee and ankle AROM WFL and without pain limiting.      PT LONG TERM GOAL #3   Title Patient to demonstrate B LE strength >/=4/5.    Status On-going      PT LONG TERM GOAL #4   Title Patient to complete TUG in <14 sec with LRAD in order to decrease risk of falls.    Status Partially Met                   Plan - 05/06/21 0927     Clinical Impression Statement Patient with tight calves, also with her on the airex she tended to have the ankle roll laterally, I had her do some ankle everison with red tband he she did well , She reports tha twhen she was going to get the mail she would walk across the grass, I told her my concern about this with the ankle rolling    PT Next Visit Plan progress R knee and ankle ROM to tolerance; LE strengthening    Consulted and Agree with Plan of Care Patient;Family member/caregiver    Family Member Consulted daughter             Patient will benefit from skilled therapeutic intervention in order to improve the following deficits and impairments:  Abnormal gait, Decreased range of motion, Difficulty walking, Increased fascial restricitons, Decreased endurance, Decreased activity tolerance, Pain, Decreased balance, Hypomobility, Impaired flexibility, Improper body mechanics, Postural dysfunction, Decreased strength, Increased edema  Visit Diagnosis: Stiffness of right ankle, not elsewhere classified  Chronic pain of right knee  Pain in  right ankle and joints of right foot  Muscle weakness (generalized)  Stiffness of  right knee, not elsewhere classified  Difficulty in walking, not elsewhere classified     Problem List Patient Active Problem List   Diagnosis Date Noted   Hypertension    Presence of permanent cardiac pacemaker    Frequent urination    Burning sensation of feet    Arthritis    Pacemaker 01/13/2018   Pulmonary hypertension, unspecified ([place]) 12/28/2017   Acute deep vein thrombosis (DVT) of left tibial vein ([place]) 06/26/2017   Aortic atherosclerosis ([place]) 06/25/2017   Pulmonary embolism ([place]) 06/24/2017   Heart block AV second degree 06/24/2017   DVT (deep venous thrombosis) ([place]) 06/24/2017   SOB (shortness of breath) on exertion 06/23/2017   Bradyarrhythmia 06/23/2017   Essential hypertension 06/23/2017   Acute pain of right wrist 08/31/2016   Acute bilateral low back pain without sciatica 08/31/2016   Closed fracture distal radius and ulna, right, initial encounter 08/31/2016   Osteoarthritis of right hip 03/25/2015   Status post total replacement of right hip 03/25/2015    Sumner Boast, PT 05/06/2021, 9:29 AM  [place]. [place], [place], [address] Phone: [phone]   Fax:  [phone]  Name: JAEDYNN BOHLKEN MRN: [id number] Date of Birth: [birth year]

## 2021-05-08 DIAGNOSIS — Z23 Encounter for immunization: Secondary | ICD-10-CM | POA: Diagnosis not present

## 2021-05-11 ENCOUNTER — Ambulatory Visit: Payer: Medicare Other | Admitting: Physical Therapy

## 2021-05-13 ENCOUNTER — Ambulatory Visit: Payer: Medicare Other | Admitting: Physical Therapy

## 2021-05-13 ENCOUNTER — Ambulatory Visit: Payer: Medicare Other | Admitting: Orthopaedic Surgery

## 2021-05-13 ENCOUNTER — Other Ambulatory Visit: Payer: Self-pay

## 2021-05-13 ENCOUNTER — Encounter: Payer: Self-pay | Admitting: Physical Therapy

## 2021-05-13 DIAGNOSIS — M6281 Muscle weakness (generalized): Secondary | ICD-10-CM | POA: Diagnosis not present

## 2021-05-13 DIAGNOSIS — M25661 Stiffness of right knee, not elsewhere classified: Secondary | ICD-10-CM

## 2021-05-13 DIAGNOSIS — M25671 Stiffness of right ankle, not elsewhere classified: Secondary | ICD-10-CM | POA: Diagnosis not present

## 2021-05-13 DIAGNOSIS — G8929 Other chronic pain: Secondary | ICD-10-CM

## 2021-05-13 DIAGNOSIS — M25561 Pain in right knee: Secondary | ICD-10-CM

## 2021-05-13 DIAGNOSIS — R262 Difficulty in walking, not elsewhere classified: Secondary | ICD-10-CM

## 2021-05-13 DIAGNOSIS — M25571 Pain in right ankle and joints of right foot: Secondary | ICD-10-CM

## 2021-05-13 NOTE — Therapy (Signed)
[place]. [place], [place], [address] Phone: [phone]   Fax:  [phone]  Physical Therapy Treatment  Patient Details  Name: Lindsey Ross MRN: [id number] Date of Birth: 1925-05-05 Referring Provider (PT): Mcarthur Rossetti, MD   Encounter Date: 05/13/2021   PT End of Session - 05/13/21 1149     Visit Number 6    Number of Visits 17    Date for PT Re-Evaluation 06/16/21    Authorization Type Medicare & Aetna    PT Start Time 1059    PT Stop Time 1147    PT Time Calculation (min) 48 min    Activity Tolerance Patient tolerated treatment well    Behavior During Therapy [facility] for tasks assessed/performed             Past Medical History:  Diagnosis Date   Acute bilateral low back pain without sciatica 08/31/2016   Acute deep vein thrombosis (DVT) of left tibial vein ([place]) 06/26/2017   Acute pain of right wrist 08/31/2016   Aortic atherosclerosis ([place]) 06/25/2017   Arthritis    "right ankle; maybe some in her back" (07/07/2017)   Bradyarrhythmia 06/23/2017   Burning sensation of feet    Bilateral feet   Closed fracture distal radius and ulna, right, initial encounter 08/31/2016   DVT (deep venous thrombosis) ([place]) 06/24/2017   LLE   Essential hypertension 06/23/2017   Frequent urination    Heart block AV second degree 06/24/2017   Hypertension    Osteoarthritis of right hip 03/25/2015   Pacemaker 01/13/2018   Presence of permanent cardiac pacemaker    Pulmonary embolism ([place]) 06/24/2017   Pulmonary hypertension, unspecified ([place]) 12/28/2017   70 mmHg by echo in October 2018   Shortness of breath dyspnea    with exertion   SOB (shortness of breath) on exertion 06/23/2017   Status post total replacement of right hip 03/25/2015    Past Surgical History:  Procedure Laterality Date   ABDOMINAL HYSTERECTOMY     ANKLE FRACTURE SURGERY Right 2003   CATARACT EXTRACTION W/ INTRAOCULAR LENS  IMPLANT,  BILATERAL Bilateral    FRACTURE SURGERY     INSERT / REPLACE / REMOVE PACEMAKER  07/07/2017   JOINT REPLACEMENT     LAPAROSCOPIC CHOLECYSTECTOMY     PACEMAKER IMPLANT N/A 07/07/2017   Procedure: PACEMAKER IMPLANT;  Surgeon: Constance Haw, MD;  Location: [facility];  Service: Cardiovascular;  Laterality: N/A;   TOTAL HIP ARTHROPLASTY Right 03/25/2015   Procedure: RIGHT TOTAL HIP ARTHROPLASTY ANTERIOR APPROACH;  Surgeon: Mcarthur Rossetti, MD;  Location: [facility];  Service: Orthopedics;  Laterality: Right;    There were no vitals filed for this visit.   Subjective Assessment - 05/13/21 1104     Subjective Doing okay, no falls, a little dizzy today    Currently in Pain? Yes    Pain Score 4     Pain Location Knee    Pain Orientation Right    Pain Descriptors / Indicators Aching;Sore    Aggravating Factors  walking                               [facility] Adult PT Treatment/Exercise - 05/13/21 0001       Ambulation/Gait   Gait Comments gait wihtout device with clsoe supervision x 100'      High Level Balance   High Level Balance Activities  Side stepping;Backward walking;Direction changes    High Level Balance Comments tried the firmer airex and she really could not tolerate this, felt unsteady and had the right ankle rolling, 4" toe touches with SPC and then cone toe touches      Knee/Hip Exercises: Aerobic   Nustep L3 x 6 min      Knee/Hip Exercises: Machines for Strengthening   Cybex Knee Flexion 15# 2x10      Knee/Hip Exercises: Seated   Long Arc Quad Right;Strengthening;10 reps;2 sets    Long Arc Quad Weight 3 lbs.    Ball Squeeze 2x10    Other Seated Knee/Hip Exercises red tband right ankle all motions 2 x10    Other Seated Knee/Hip Exercises pball in lap isometric abs 2x10    Marching Strengthening;Both;2 sets;10 reps    Marching Weights 3 lbs.                       PT Short Term Goals - 04/23/21 1155       PT SHORT  TERM GOAL #1   Title Patient to be independent with initial HEP.    Status Achieved               PT Long Term Goals - 05/06/21 2993       PT LONG TERM GOAL #1   Title Patient to be independent with advanced HEP.    Status On-going      PT LONG TERM GOAL #2   Title Patient to demonstrate knee and ankle AROM WFL and without pain limiting.      PT LONG TERM GOAL #3   Title Patient to demonstrate B LE strength >/=4/5.    Status On-going      PT LONG TERM GOAL #4   Title Patient to complete TUG in <14 sec with LRAD in order to decrease risk of falls.    Status Partially Met                   Plan - 05/13/21 1150     Clinical Impression Statement Patient reports that she is doing better, less pain, has not tried walking much,   The knee extnesion at times hurts, the airex standing really shows the instability of the right ankle as it rolls latreally and she cannot correct with verbal cues.  She was able to tolerate the walking without the device with supervision.  100 feet is her nmax gets tired and short of breath    PT Next Visit Plan progress R knee and ankle ROM to tolerance; LE strengthening    Consulted and Agree with Plan of Care Patient;Family member/caregiver    Family Member Consulted daughter             Patient will benefit from skilled therapeutic intervention in order to improve the following deficits and impairments:  Abnormal gait, Decreased range of motion, Difficulty walking, Increased fascial restricitons, Decreased endurance, Decreased activity tolerance, Pain, Decreased balance, Hypomobility, Impaired flexibility, Improper body mechanics, Postural dysfunction, Decreased strength, Increased edema  Visit Diagnosis: Stiffness of right ankle, not elsewhere classified  Chronic pain of right knee  Pain in right ankle and joints of right foot  Muscle weakness (generalized)  Stiffness of right knee, not elsewhere classified  Difficulty in  walking, not elsewhere classified     Problem List Patient Active Problem List   Diagnosis Date Noted   Hypertension    Presence of permanent cardiac pacemaker  Frequent urination    Burning sensation of feet    Arthritis    Pacemaker 01/13/2018   Pulmonary hypertension, unspecified ([place]) 12/28/2017   Acute deep vein thrombosis (DVT) of left tibial vein ([place]) 06/26/2017   Aortic atherosclerosis ([place]) 06/25/2017   Pulmonary embolism ([place]) 06/24/2017   Heart block AV second degree 06/24/2017   DVT (deep venous thrombosis) ([place]) 06/24/2017   SOB (shortness of breath) on exertion 06/23/2017   Bradyarrhythmia 06/23/2017   Essential hypertension 06/23/2017   Acute pain of right wrist 08/31/2016   Acute bilateral low back pain without sciatica 08/31/2016   Closed fracture distal radius and ulna, right, initial encounter 08/31/2016   Osteoarthritis of right hip 03/25/2015   Status post total replacement of right hip 03/25/2015    Sumner Boast, PT 05/13/2021, 12:00 PM  [place]. [place], [place], [address] Phone: [phone]   Fax:  [phone]  Name: Lindsey Ross MRN: [id number] Date of Birth: 01/14/1925

## 2021-05-15 ENCOUNTER — Encounter: Payer: Self-pay | Admitting: Physical Therapy

## 2021-05-15 ENCOUNTER — Ambulatory Visit: Payer: Medicare Other | Admitting: Physical Therapy

## 2021-05-15 ENCOUNTER — Other Ambulatory Visit: Payer: Self-pay

## 2021-05-15 DIAGNOSIS — M25671 Stiffness of right ankle, not elsewhere classified: Secondary | ICD-10-CM

## 2021-05-15 DIAGNOSIS — M25571 Pain in right ankle and joints of right foot: Secondary | ICD-10-CM | POA: Diagnosis not present

## 2021-05-15 DIAGNOSIS — M25661 Stiffness of right knee, not elsewhere classified: Secondary | ICD-10-CM | POA: Diagnosis not present

## 2021-05-15 DIAGNOSIS — G8929 Other chronic pain: Secondary | ICD-10-CM | POA: Diagnosis not present

## 2021-05-15 DIAGNOSIS — M6281 Muscle weakness (generalized): Secondary | ICD-10-CM

## 2021-05-15 DIAGNOSIS — M25561 Pain in right knee: Secondary | ICD-10-CM | POA: Diagnosis not present

## 2021-05-15 NOTE — Therapy (Signed)
[facility] [place] [address] [place], [place], [address] Phone: [phone]   Fax:  [phone]  Physical Therapy Treatment  Patient Details  Name: Lindsey Ross MRN: [id number] Date of Birth: 09/02/1924 Referring Provider (PT): Blackman, Christopher Y, MD   Encounter Date: 05/15/2021    Past Medical History:  Diagnosis Date   Acute bilateral low back pain without sciatica 08/31/2016   Acute deep vein thrombosis (DVT) of left tibial vein (HCC) 06/26/2017   Acute pain of right wrist 08/31/2016   Aortic atherosclerosis (HCC) 06/25/2017   Arthritis    "right ankle; maybe some in her back" (07/07/2017)   Bradyarrhythmia 06/23/2017   Burning sensation of feet    Bilateral feet   Closed fracture distal radius and ulna, right, initial encounter 08/31/2016   DVT (deep venous thrombosis) (HCC) 06/24/2017   LLE   Essential hypertension 06/23/2017   Frequent urination    Heart block AV second degree 06/24/2017   Hypertension    Osteoarthritis of right hip 03/25/2015   Pacemaker 01/13/2018   Presence of permanent cardiac pacemaker    Pulmonary embolism (HCC) 06/24/2017   Pulmonary hypertension, unspecified (HCC) 12/28/2017   70 mmHg by echo in October 2018   Shortness of breath dyspnea    with exertion   SOB (shortness of breath) on exertion 06/23/2017   Status post total replacement of right hip 03/25/2015    Past Surgical History:  Procedure Laterality Date   ABDOMINAL HYSTERECTOMY     ANKLE FRACTURE SURGERY Right 2003   CATARACT EXTRACTION W/ INTRAOCULAR LENS  IMPLANT, BILATERAL Bilateral    FRACTURE SURGERY     INSERT / REPLACE / REMOVE PACEMAKER  07/07/2017   JOINT REPLACEMENT     LAPAROSCOPIC CHOLECYSTECTOMY     PACEMAKER IMPLANT N/A 07/07/2017   Procedure: PACEMAKER IMPLANT;  Surgeon: Camnitz, Will Martin, MD;  Location: [facility];  Service: Cardiovascular;  Laterality: N/A;   TOTAL HIP ARTHROPLASTY Right 03/25/2015    Procedure: RIGHT TOTAL HIP ARTHROPLASTY ANTERIOR APPROACH;  Surgeon: Christopher Y Blackman, MD;  Location: [facility] OR;  Service: Orthopedics;  Laterality: Right;    There were no vitals filed for this visit.   Subjective Assessment - 05/15/21 0932     Subjective Ok, dizzy this morning. BP was taken 109/61 mmHg    Pertinent History L tibial DVT 2018, bradyarrythmia, R radius-ulna fx 2018, HTN, 2nd deg AV heart block, pacemaker, pulmonary HTN, R THA 2016, R ankle fx surgery 2003    Currently in Pain? Yes    Pain Score 5     Pain Location Knee    Pain Orientation Right                               [facility] Adult PT Treatment/Exercise - 05/15/21 0001       High Level Balance   High Level Balance Activities Side stepping;Backward walking   HHA x 2     Knee/Hip Exercises: Standing   Other Standing Knee Exercises Alt 4 in box taps SPC x10 each      Knee/Hip Exercises: Seated   Ball Squeeze 2x10    Hamstring Curl Strengthening;Both;2 sets;15 reps    Hamstring Limitations green tband    Sit to Sand 2 sets;5 reps;with UE support                       PT   Short Term Goals - 04/23/21 1155       PT SHORT TERM GOAL #1   Title Patient to be independent with initial HEP.    Status Achieved               PT Long Term Goals - 05/06/21 0928       PT LONG TERM GOAL #1   Title Patient to be independent with advanced HEP.    Status On-going      PT LONG TERM GOAL #2   Title Patient to demonstrate knee and ankle AROM WFL and without pain limiting.      PT LONG TERM GOAL #3   Title Patient to demonstrate B LE strength >/=4/5.    Status On-going      PT LONG TERM GOAL #4   Title Patient to complete TUG in <14 sec with LRAD in order to decrease risk of falls.    Status Partially Met                   Plan - 05/15/21 1011     Clinical Impression Statement Pt enters clinic reporting some dizziness and fatigue. BP taken before session. Pt  appeared to have low energy throughout. HHA required for all balance activities. No reports of increase pain. Close CGA needed with alt box taps.    Personal Factors and Comorbidities Age;Fitness;Comorbidity 3+;Transportation;Time since onset of injury/illness/exacerbation;Past/Current Experience    Comorbidities L tibial DVT 2018, bradyarrythmia, R radius-ulna fx 2018, HTN, 2nd deg AV heart block, pacemaker, pulmonary HTN, R THA 2016, R ankle fx surgery 2003    Examination-Activity Limitations Bathing;Locomotion Level;Transfers;Reach Overhead;Bed Mobility;Bend;Sit;Carry;Squat;Sleep;Stairs;Dressing;Hygiene/Grooming;Stand;Toileting;Lift    Examination-Participation Restrictions Laundry;Shop;Community Activity;Cleaning;Church;Meal Prep    Stability/Clinical Decision Making Stable/Uncomplicated    Rehab Potential Good    PT Frequency 2x / week    PT Treatment/Interventions ADLs/Self Care Home Management;Cryotherapy;DME Instruction;Moist Heat;Iontophoresis 4mg/ml Dexamethasone;Gait training;Stair training;Functional mobility training;Therapeutic activities;Therapeutic exercise;Balance training;Neuromuscular re-education;Manual techniques;Patient/family education;Passive range of motion;Dry needling;Energy conservation;Vasopneumatic Device;Taping    PT Next Visit Plan progress R knee and ankle ROM to tolerance; LE strengthening             Patient will benefit from skilled therapeutic intervention in order to improve the following deficits and impairments:  Abnormal gait, Decreased range of motion, Difficulty walking, Increased fascial restricitons, Decreased endurance, Decreased activity tolerance, Pain, Decreased balance, Hypomobility, Impaired flexibility, Improper body mechanics, Postural dysfunction, Decreased strength, Increased edema  Visit Diagnosis: Chronic pain of right knee  Pain in right ankle and joints of right foot  Muscle weakness (generalized)  Stiffness of right ankle, not  elsewhere classified     Problem List Patient Active Problem List   Diagnosis Date Noted   Hypertension    Presence of permanent cardiac pacemaker    Frequent urination    Burning sensation of feet    Arthritis    Pacemaker 01/13/2018   Pulmonary hypertension, unspecified (HCC) 12/28/2017   Acute deep vein thrombosis (DVT) of left tibial vein (HCC) 06/26/2017   Aortic atherosclerosis (HCC) 06/25/2017   Pulmonary embolism (HCC) 06/24/2017   Heart block AV second degree 06/24/2017   DVT (deep venous thrombosis) (HCC) 06/24/2017   SOB (shortness of breath) on exertion 06/23/2017   Bradyarrhythmia 06/23/2017   Essential hypertension 06/23/2017   Acute pain of right wrist 08/31/2016   Acute bilateral low back pain without sciatica 08/31/2016   Closed fracture distal radius and ulna, right, initial encounter 08/31/2016   Osteoarthritis of right hip 03/25/2015     Status post total replacement of right hip 03/25/2015    Ronald G Pemberton, PTA 05/15/2021, 10:15 AM  [facility] [place] [facility] [address] [place], [place], [address] Phone: [phone]   Fax:  [phone]  Name: Anderson B Nees MRN: [id number] Date of Birth: 01/06/1925    

## 2021-05-18 ENCOUNTER — Ambulatory Visit: Payer: Medicare Other | Admitting: Orthopaedic Surgery

## 2021-05-18 ENCOUNTER — Encounter: Payer: Self-pay | Admitting: Physical Therapy

## 2021-05-18 ENCOUNTER — Ambulatory Visit: Payer: Medicare Other | Admitting: Physical Therapy

## 2021-05-18 ENCOUNTER — Other Ambulatory Visit: Payer: Self-pay

## 2021-05-18 DIAGNOSIS — G8929 Other chronic pain: Secondary | ICD-10-CM | POA: Diagnosis not present

## 2021-05-18 DIAGNOSIS — M25661 Stiffness of right knee, not elsewhere classified: Secondary | ICD-10-CM

## 2021-05-18 DIAGNOSIS — R262 Difficulty in walking, not elsewhere classified: Secondary | ICD-10-CM

## 2021-05-18 DIAGNOSIS — M25671 Stiffness of right ankle, not elsewhere classified: Secondary | ICD-10-CM

## 2021-05-18 DIAGNOSIS — M6281 Muscle weakness (generalized): Secondary | ICD-10-CM

## 2021-05-18 DIAGNOSIS — M25571 Pain in right ankle and joints of right foot: Secondary | ICD-10-CM

## 2021-05-18 DIAGNOSIS — M25561 Pain in right knee: Secondary | ICD-10-CM | POA: Diagnosis not present

## 2021-05-18 NOTE — Therapy (Signed)
[place]. [place], [place], [address] Phone: [phone]   Fax:  [phone]  Physical Therapy Treatment  Patient Details  Name: Lindsey Ross MRN: [id number] Date of Birth: 08-Apr-1925 Referring Provider (PT): Mcarthur Rossetti, MD   Encounter Date: 05/18/2021   PT End of Session - 05/18/21 1012     Visit Number 7    Date for PT Re-Evaluation 06/16/21    Authorization Type Medicare & [employer]    PT Start Time 0930    PT Stop Time 6834    PT Time Calculation (min) 45 min    Activity Tolerance Patient tolerated treatment well    Behavior During Therapy [facility] for tasks assessed/performed             Past Medical History:  Diagnosis Date   Acute bilateral low back pain without sciatica 08/31/2016   Acute deep vein thrombosis (DVT) of left tibial vein ([place]) 06/26/2017   Acute pain of right wrist 08/31/2016   Aortic atherosclerosis ([place]) 06/25/2017   Arthritis    "right ankle; maybe some in her back" (07/07/2017)   Bradyarrhythmia 06/23/2017   Burning sensation of feet    Bilateral feet   Closed fracture distal radius and ulna, right, initial encounter 08/31/2016   DVT (deep venous thrombosis) ([place]) 06/24/2017   LLE   Essential hypertension 06/23/2017   Frequent urination    Heart block AV second degree 06/24/2017   Hypertension    Osteoarthritis of right hip 03/25/2015   Pacemaker 01/13/2018   Presence of permanent cardiac pacemaker    Pulmonary embolism ([place]) 06/24/2017   Pulmonary hypertension, unspecified ([place]) 12/28/2017   70 mmHg by echo in October 2018   Shortness of breath dyspnea    with exertion   SOB (shortness of breath) on exertion 06/23/2017   Status post total replacement of right hip 03/25/2015    Past Surgical History:  Procedure Laterality Date   ABDOMINAL HYSTERECTOMY     ANKLE FRACTURE SURGERY Right 2003   CATARACT EXTRACTION W/ INTRAOCULAR LENS  IMPLANT, BILATERAL Bilateral    FRACTURE  SURGERY     INSERT / REPLACE / REMOVE PACEMAKER  07/07/2017   JOINT REPLACEMENT     LAPAROSCOPIC CHOLECYSTECTOMY     PACEMAKER IMPLANT N/A 07/07/2017   Procedure: PACEMAKER IMPLANT;  Surgeon: Constance Haw, MD;  Location: [facility];  Service: Cardiovascular;  Laterality: N/A;   TOTAL HIP ARTHROPLASTY Right 03/25/2015   Procedure: RIGHT TOTAL HIP ARTHROPLASTY ANTERIOR APPROACH;  Surgeon: Mcarthur Rossetti, MD;  Location: [facility];  Service: Orthopedics;  Laterality: Right;    There were no vitals filed for this visit.   Subjective Assessment - 05/18/21 0937     Subjective Pt reports that things are going ok    Currently in Pain? Yes    Pain Score 5     Pain Location Knee    Pain Orientation Right                [facility] PT Assessment - 05/18/21 0001       AROM   Right Knee Extension 4    Right Knee Flexion 90    Left Knee Extension 0    Left Knee Flexion 126    Right Ankle Dorsiflexion 7    Right Ankle Plantar Flexion 55    Right Ankle Inversion 36    Right Ankle Eversion 13      Strength   Right  Hip Flexion 4/5    Right Hip ABduction 4+/5    Right Hip ADduction 4+/5    Left Hip Flexion 5/5    Left Hip ABduction 4+/5    Left Hip ADduction 4+/5    Right Knee Flexion 4/5    Right Knee Extension 4/5    Left Knee Flexion 4+/5    Left Knee Extension 5/5    Right Ankle Dorsiflexion 4+/5    Right Ankle Plantar Flexion 3/5    Right Ankle Inversion 4+/5    Right Ankle Eversion 4-/5    Left Ankle Dorsiflexion 4+/5    Left Ankle Plantar Flexion 4+/5    Left Ankle Inversion 4+/5    Left Ankle Eversion 4/5                           [facility] Adult PT Treatment/Exercise - 05/18/21 0001       Ambulation/Gait   Gait Comments gait wihtout device with clsoe supervision x 100'      High Level Balance   High Level Balance Activities Backward walking;Side stepping      Knee/Hip Exercises: Aerobic   Nustep L2 x 6 min      Knee/Hip Exercises:  Standing   Other Standing Knee Exercises Standing march 5lb 2x10      Knee/Hip Exercises: Seated   Long Arc Quad Right;Strengthening;10 reps;2 sets    Long Arc Quad Weight 5 lbs.    Hamstring Curl Strengthening;Both;1 set;10 reps    Hamstring Limitations blue Tband                       PT Short Term Goals - 04/23/21 1155       PT SHORT TERM GOAL #1   Title Patient to be independent with initial HEP.    Status Achieved               PT Long Term Goals - 05/18/21 7209       PT LONG TERM GOAL #2   Title Patient to demonstrate knee and ankle AROM WFL and without pain limiting.    Status Partially Met      PT LONG TERM GOAL #3   Title Patient to demonstrate B LE strength >/=4/5.    Status Partially Met      PT LONG TERM GOAL #5   Title Patient to report tolerance for walking to the mailbox without pain limiting.    Status On-going   has not tried                  Plan - 05/18/21 1012     Clinical Impression Statement Pt is progressing well overall, she reports improvement with her R knee pain but not sure if it is from receiving injection or therapy. She has progressed increasing her LE strength and AROM. Progressed today with ambulation and hight level balance activities without AD with close SBA. Increase resistance tolerated with standing marches, LAQ, and hamstring curls. Some initial instability noted with marching but hat improved as reps progressed. Pt stated that's he has not tried to walk to mail but thinks she could.    Personal Factors and Comorbidities Age;Fitness;Comorbidity 3+;Transportation;Time since onset of injury/illness/exacerbation;Past/Current Experience    Comorbidities L tibial DVT 2018, bradyarrythmia, R radius-ulna fx 2018, HTN, 2nd deg AV heart block, pacemaker, pulmonary HTN, R THA 2016, R ankle fx surgery 2003    Examination-Participation Restrictions Laundry;Shop;Community Activity;Cleaning;Church;Meal Prep  Stability/Clinical Decision Making Stable/Uncomplicated    Rehab Potential Good    PT Frequency 2x / week    PT Treatment/Interventions ADLs/Self Care Home Management;Cryotherapy;DME Instruction;Moist Heat;Iontophoresis 55m/ml Dexamethasone;Gait training;Stair training;Functional mobility training;Therapeutic activities;Therapeutic exercise;Balance training;Neuromuscular re-education;Manual techniques;Patient/family education;Passive range of motion;Dry needling;Energy conservation;Vasopneumatic Device;Taping    PT Next Visit Plan progress R knee and ankle ROM to tolerance; LE strengthening, Get report from MD             Patient will benefit from skilled therapeutic intervention in order to improve the following deficits and impairments:  Abnormal gait, Decreased range of motion, Difficulty walking, Increased fascial restricitons, Decreased endurance, Decreased activity tolerance, Pain, Decreased balance, Hypomobility, Impaired flexibility, Improper body mechanics, Postural dysfunction, Decreased strength, Increased edema  Visit Diagnosis: Chronic pain of right knee  Pain in right ankle and joints of right foot  Muscle weakness (generalized)  Stiffness of right ankle, not elsewhere classified  Stiffness of right knee, not elsewhere classified  Difficulty in walking, not elsewhere classified     Problem List Patient Active Problem List   Diagnosis Date Noted   Hypertension    Presence of permanent cardiac pacemaker    Frequent urination    Burning sensation of feet    Arthritis    Pacemaker 01/13/2018   Pulmonary hypertension, unspecified ([place] 12/28/2017   Acute deep vein thrombosis (DVT) of left tibial vein (HCC) 06/26/2017   [place] ([place] 06/25/2017   Pulmonary embolism ([place] 06/24/2017   Heart block AV second degree 06/24/2017   DVT (deep venous thrombosis) (HCC) 06/24/2017   SOB (shortness of breath) on exertion 06/23/2017   Bradyarrhythmia 06/23/2017    Essential hypertension 06/23/2017   Acute pain of right wrist 08/31/2016   Acute bilateral low back pain without sciatica 08/31/2016   Closed fracture distal radius and ulna, right, initial encounter 08/31/2016   Osteoarthritis of right hip 03/25/2015   Status post total replacement of right hip 03/25/2015    RScot Jun PTA 05/18/2021, 10:16 AM  [place] [address]Phone: [phone]  Fax:  [phone] Name: Lindsey SUCHOCKIMRN: [id number] of Birth: [birth year]

## 2021-05-19 ENCOUNTER — Encounter: Payer: Self-pay | Admitting: Orthopaedic Surgery

## 2021-05-19 ENCOUNTER — Ambulatory Visit (INDEPENDENT_AMBULATORY_CARE_PROVIDER_SITE_OTHER): Payer: Medicare Other | Admitting: Orthopaedic Surgery

## 2021-05-19 DIAGNOSIS — M19071 Primary osteoarthritis, right ankle and foot: Secondary | ICD-10-CM | POA: Diagnosis not present

## 2021-05-19 DIAGNOSIS — M1711 Unilateral primary osteoarthritis, right knee: Secondary | ICD-10-CM

## 2021-05-19 DIAGNOSIS — M25561 Pain in right knee: Secondary | ICD-10-CM | POA: Diagnosis not present

## 2021-05-19 DIAGNOSIS — M25571 Pain in right ankle and joints of right foot: Secondary | ICD-10-CM

## 2021-05-19 DIAGNOSIS — G8929 Other chronic pain: Secondary | ICD-10-CM | POA: Diagnosis not present

## 2021-05-19 NOTE — Progress Notes (Signed)
The patient comes in today for follow-up after having been through physical therapy for her right knee and her right ankle.  She is 87 years old.  She does ambulate with a cane.  She has severe tricompartmental of arthritis with her right knee and posttraumatic arthritis in the right ankle.  She has had previous ankle surgery with hardware from a trimalleolar ankle fracture.  I did place a steroid injection at her last visit a month ago in her right knee and her right ankle.  She says the physical therapy has helped quite a bit.  She has sessions until October.  She feels like her mobility is better.  She is still having pain but overall she looks better and her daughter is with her said that she is improving as well.  The right knee does have valgus malalignment with good range of motion and pain throughout the arc of motion.  The right ankle still has painful arc of motion but it is stable.  The daughter states that therapy feels like a lot of her issues are having to do more with her ankle and her knee.  From my standpoint she knows to wait at least 3 months between steroid injections.  We can always extend her therapy if that is helping and they will let us know.  She is using prescription dose Voltaren gel which was much better in terms of least costly than the over-the-counter form so we can certainly we could prescribe this again if she needs it.

## 2021-05-20 ENCOUNTER — Ambulatory Visit: Payer: Medicare Other | Admitting: Physical Therapy

## 2021-05-20 ENCOUNTER — Encounter: Payer: Self-pay | Admitting: Physical Therapy

## 2021-05-20 ENCOUNTER — Other Ambulatory Visit: Payer: Self-pay

## 2021-05-20 DIAGNOSIS — M25571 Pain in right ankle and joints of right foot: Secondary | ICD-10-CM | POA: Diagnosis not present

## 2021-05-20 DIAGNOSIS — G8929 Other chronic pain: Secondary | ICD-10-CM

## 2021-05-20 DIAGNOSIS — M25661 Stiffness of right knee, not elsewhere classified: Secondary | ICD-10-CM

## 2021-05-20 DIAGNOSIS — M25671 Stiffness of right ankle, not elsewhere classified: Secondary | ICD-10-CM | POA: Diagnosis not present

## 2021-05-20 DIAGNOSIS — M6281 Muscle weakness (generalized): Secondary | ICD-10-CM | POA: Diagnosis not present

## 2021-05-20 DIAGNOSIS — R262 Difficulty in walking, not elsewhere classified: Secondary | ICD-10-CM

## 2021-05-20 DIAGNOSIS — M25561 Pain in right knee: Secondary | ICD-10-CM | POA: Diagnosis not present

## 2021-05-20 NOTE — Therapy (Signed)
[facility] [place], [place], [address] Phone: [phone]   Fax:  [phone]  Physical Therapy Progress Note  Patient Details  Name: Lindsey Ross MRN: [id number] Date of Birth: [birth year] Referring Provider (PT): Mcarthur Rossetti, MD  Progress Note Reporting Period 04/21/21 to 05/20/21  See note below for Objective Data and Assessment of Progress/Goals.    Encounter Date: 05/20/2021   PT End of Session - 05/20/21 0929     Visit Number 8    Number of Visits 17    Date for PT Re-Evaluation 06/16/21    Authorization Type Medicare & Aetna    PT Start Time [phone]    PT Stop Time 0926    PT Time Calculation (min) 42 min    Activity Tolerance Patient tolerated treatment well    Behavior During Therapy [facility] for tasks assessed/performed             Past Medical History:  Diagnosis Date   Acute bilateral low back pain without sciatica 08/31/2016   Acute deep vein thrombosis (DVT) of left tibial vein (HCC) 06/26/2017   Acute pain of right wrist 08/31/2016   Aortic atherosclerosis ([place]) 06/25/2017   Arthritis    "right ankle; maybe some in her back" (07/07/2017)   Bradyarrhythmia 06/23/2017   Burning sensation of feet    Bilateral feet   Closed fracture distal radius and ulna, right, initial encounter 08/31/2016   DVT (deep venous thrombosis) ([place]) 06/24/2017   LLE   Essential hypertension 06/23/2017   Frequent urination    Heart block AV second degree 06/24/2017   Hypertension    Osteoarthritis of right hip 03/25/2015   Pacemaker 01/13/2018   Presence of permanent cardiac pacemaker    Pulmonary embolism ([place]) 06/24/2017   Pulmonary hypertension, unspecified ([place]) 12/28/2017   70 mmHg by echo in October 2018   Shortness of breath dyspnea    with exertion   SOB (shortness of breath) on exertion 06/23/2017   Status post total replacement of right hip 03/25/2015    Past Surgical History:  Procedure Laterality  Date   ABDOMINAL HYSTERECTOMY     ANKLE FRACTURE SURGERY Right 2003   CATARACT EXTRACTION W/ INTRAOCULAR LENS  IMPLANT, BILATERAL Bilateral    FRACTURE SURGERY     INSERT / REPLACE / REMOVE PACEMAKER  07/07/2017   JOINT REPLACEMENT     LAPAROSCOPIC CHOLECYSTECTOMY     PACEMAKER IMPLANT N/A 07/07/2017   Procedure: PACEMAKER IMPLANT;  Surgeon: Constance Haw, MD;  Location: [facility];  Service: Cardiovascular;  Laterality: N/A;   TOTAL HIP ARTHROPLASTY Right 03/25/2015   Procedure: RIGHT TOTAL HIP ARTHROPLASTY ANTERIOR APPROACH;  Surgeon: Mcarthur Rossetti, MD;  Location: [facility];  Service: Orthopedics;  Laterality: Right;    There were no vitals filed for this visit.   Subjective Assessment - 05/20/21 0841     Subjective Saw Ortho yesterday and that appointment went very well. Reports that she feels that she is getting better and notes that walking is getting easier.    Patient is accompained by: Family member   daughter   Pertinent History L tibial DVT 2018, bradyarrythmia, R radius-ulna fx 2018, HTN, 2nd deg AV heart block, pacemaker, pulmonary HTN, R THA 2016, R ankle fx surgery 2003    Diagnostic tests 04/15/21 R knee xray: severe tricompartment arthritis with valgus   malalignment.  There is complete joint space narrowing and loss of the   lateral  compartment and osteophytes in all 3 compartments; 04/15/21 R ankle xray: retained hardware from fixation of a   trimalleolar ankle fracture dislocation.  The bone is osteopenic.  There   is posttraumatic arthritis of the ankle joint.    Patient Stated Goals decrease pain    Currently in Pain? Yes    Pain Score 4     Pain Location Knee    Pain Orientation Right    Pain Descriptors / Indicators Aching    Pain Type Chronic pain                [facility] PT Assessment - 05/20/21 0001       Assessment   Medical Diagnosis Chronic pain of right knee    Referring Provider (PT) Mcarthur Rossetti, MD    Onset  Date/Surgical Date 10/20/20      Observation/Other Assessments   Focus on Therapeutic Outcomes (FOTO)  Knee: 47      AROM   Overall AROM Comments carried over from 09/26    Right Knee Extension 4    Right Knee Flexion 90    Left Knee Extension 0    Left Knee Flexion 126    Right Ankle Dorsiflexion 7    Right Ankle Plantar Flexion 55    Right Ankle Inversion 36    Right Ankle Eversion 13      PROM   Overall PROM Comments carried over from 09/26    Right Knee Extension 5    Right Knee Flexion 78    Left Knee Extension 2    Left Knee Flexion 122      Strength   Overall Strength Comments carried over from 09/26    Right Hip Flexion 4/5    Right Hip ABduction 4+/5    Right Hip ADduction 4+/5    Left Hip Flexion 5/5    Left Hip ABduction 4+/5    Left Hip ADduction 4+/5    Right Knee Flexion 4/5    Right Knee Extension 4/5    Left Knee Flexion 4+/5    Left Knee Extension 5/5    Right Ankle Dorsiflexion 4+/5    Right Ankle Plantar Flexion 3/5    Right Ankle Inversion 4+/5    Right Ankle Eversion 4-/5    Left Ankle Dorsiflexion 4+/5    Left Ankle Plantar Flexion 4+/5    Left Ankle Inversion 4+/5    Left Ankle Eversion 4/5      Timed Up and Go Test   Normal TUG (seconds) --   21.43 sec no AD, 19.81 sec with SPC                          [facility] Adult PT Treatment/Exercise - 05/20/21 0001       Knee/Hip Exercises: Aerobic   Nustep L2 x 6 min      Knee/Hip Exercises: Seated   Long Arc Quad Strengthening    Long Arc Quad Limitations yellow loop    Hamstring Curl Strengthening;Both;1 set;10 reps    Hamstring Limitations green TB   cues to decrease speed   Sit to [employer] 2 sets;5 reps;with UE support   1 hand on knee, other on armrest; 2nd set elevated on airex with B hands on knees and min A     Knee/Hip Exercises: Sidelying   Clams 10x each side   manual assistance to maintain ankles together   Other Sidelying Knee/Hip Exercises R ankle inversion/eversion  AROM  10x   good tolerance                    PT Education - 05/20/21 0928     Education Details update to HEP; discussion on objective progress, administered green TB for HS curl    Person(s) Educated Patient;Child(ren)   daughter   Methods Explanation;Demonstration;Tactile cues;Verbal cues;Handout    Comprehension Verbalized understanding;Returned demonstration              PT Short Term Goals - 05/20/21 0933       PT SHORT TERM GOAL #1   Title Patient to be independent with initial HEP.    Status Achieved               PT Long Term Goals - 05/20/21 0933       PT LONG TERM GOAL #1   Title Patient to be independent with advanced HEP.    Time 8    Period Weeks    Status Partially Met   met for current     PT LONG TERM GOAL #2   Title Patient to demonstrate knee and ankle AROM WFL and without pain limiting.    Time 8    Period Weeks    Status On-going   improved; still limited in R knee flexion     PT LONG TERM GOAL #3   Title Patient to demonstrate B LE strength >/=4/5.    Time 8    Period Weeks    Status On-going   improving, still limited     PT LONG TERM GOAL #4   Title Patient to complete TUG in <14 sec with LRAD in order to decrease risk of falls.    Time 8    Period Weeks    Status On-going   improved but, still >14 sec with and without AD     PT LONG TERM GOAL #5   Title Patient to report tolerance for walking to the mailbox without pain limiting.    Time 8    Period Weeks    Status On-going   has not attempted, but reports that she believes she could do this                  Plan - 05/20/21 0929     Clinical Impression Statement Patient arrived to session with daughter without new complaints. Reports that she feels that she is getting better and notes that walking is getting easier. TUG testing revealed improvement in gait speed, however still demonstrates an increased risk of falls. Reports that she has not attempted to  walk to the mailbox as she has someone to get her mail for her, but feels that she could do it. Encouraged patient to attempt this task with a family member supervising. ROM and strength measurements from 05/18/21 revealed excellent improvement in ankle and knee mobility as well as LE strength. Worked on STS transfers with use of elevated seat and cues for proper set up and adequate anterior trunk lean to improve ease of transfer. Patient using heavy UE support at baseline, but able to perform STS with hands on knees with min A. Progressive hip and quad/HS strengthening was tolerated well today. Intermittent cues required for form. Updated HEP with exercises that were well-tolerated today. Patient and daughter reported understanding and without complaints at end of session. Patient is progressing quickly and would benefit from additional skilled PT services to address remaining goals.    Personal Factors and  Comorbidities Age;Fitness;Comorbidity 3+;Transportation;Time since onset of injury/illness/exacerbation;Past/Current Experience    Comorbidities L tibial DVT 2018, bradyarrythmia, R radius-ulna fx 2018, HTN, 2nd deg AV heart block, pacemaker, pulmonary HTN, R THA 2016, R ankle fx surgery 2003    Examination-Participation Restrictions Laundry;Shop;Community Activity;Cleaning;Church;Meal Prep    Stability/Clinical Decision Making Stable/Uncomplicated    Rehab Potential Good    PT Frequency 2x / week    PT Treatment/Interventions ADLs/Self Care Home Management;Cryotherapy;DME Instruction;Moist Heat;Iontophoresis 79m/ml Dexamethasone;Gait training;Stair training;Functional mobility training;Therapeutic activities;Therapeutic exercise;Balance training;Neuromuscular re-education;Manual techniques;Patient/family education;Passive range of motion;Dry needling;Energy conservation;Vasopneumatic Device;Taping    PT Next Visit Plan try to decrease UE use with STS transfers; R knee and ankle ROM to tolerance; LE  strengthening,    Consulted and Agree with Plan of Care Patient;Family member/caregiver    Family Member Consulted daughter             Patient will benefit from skilled therapeutic intervention in order to improve the following deficits and impairments:  Abnormal gait, Decreased range of motion, Difficulty walking, Increased fascial restricitons, Decreased endurance, Decreased activity tolerance, Pain, Decreased balance, Hypomobility, Impaired flexibility, Improper body mechanics, Postural dysfunction, Decreased strength, Increased edema  Visit Diagnosis: Chronic pain of right knee  Pain in right ankle and joints of right foot  Muscle weakness (generalized)  Stiffness of right ankle, not elsewhere classified  Stiffness of right knee, not elsewhere classified  Difficulty in walking, not elsewhere classified     Problem List Patient Active Problem List   Diagnosis Date Noted   Hypertension    Presence of permanent cardiac pacemaker    Frequent urination    Burning sensation of feet    Arthritis    Pacemaker 01/13/2018   Pulmonary hypertension, unspecified ([place] 12/28/2017   Acute deep vein thrombosis (DVT) of left [place] vein ([place] 06/26/2017   Aortic atherosclerosis ([place] 06/25/2017   [place] embolism ([place] 06/24/2017   Heart block AV second degree 06/24/2017   DVT (deep venous thrombosis) ([place] 06/24/2017   SOB (shortness of breath) on exertion 06/23/2017   Bradyarrhythmia 06/23/2017   Essential hypertension 06/23/2017   Acute pain of right wrist 08/31/2016   Acute bilateral low back pain without sciatica 08/31/2016   Closed fracture distal radius and ulna, right, initial encounter 08/31/2016   Osteoarthritis of right hip 03/25/2015   Status post total replacement of right hip 03/25/2015    YJanene Harvey PT, DPT 05/20/21 9:39 AM   [place] [address]Phone:  [phone]  Fax:  [phone] Name: Lindsey CROLLMRN: [id number] of Birth: 1July 02, 1926

## 2021-05-26 ENCOUNTER — Encounter: Payer: Self-pay | Admitting: Physical Therapy

## 2021-05-26 ENCOUNTER — Ambulatory Visit: Payer: Medicare Other | Attending: Orthopaedic Surgery | Admitting: Physical Therapy

## 2021-05-26 ENCOUNTER — Other Ambulatory Visit: Payer: Self-pay

## 2021-05-26 DIAGNOSIS — M25561 Pain in right knee: Secondary | ICD-10-CM | POA: Insufficient documentation

## 2021-05-26 DIAGNOSIS — M6281 Muscle weakness (generalized): Secondary | ICD-10-CM | POA: Insufficient documentation

## 2021-05-26 DIAGNOSIS — M25571 Pain in right ankle and joints of right foot: Secondary | ICD-10-CM | POA: Insufficient documentation

## 2021-05-26 DIAGNOSIS — G8929 Other chronic pain: Secondary | ICD-10-CM | POA: Diagnosis not present

## 2021-05-26 DIAGNOSIS — R262 Difficulty in walking, not elsewhere classified: Secondary | ICD-10-CM | POA: Insufficient documentation

## 2021-05-26 DIAGNOSIS — M25671 Stiffness of right ankle, not elsewhere classified: Secondary | ICD-10-CM | POA: Insufficient documentation

## 2021-05-26 DIAGNOSIS — M25661 Stiffness of right knee, not elsewhere classified: Secondary | ICD-10-CM | POA: Diagnosis not present

## 2021-05-26 NOTE — Therapy (Signed)
[facility] [place], [place], [address] Phone: [phone]   Fax:  [phone]  Physical Therapy Treatment  Patient Details  Name: Lindsey Ross MRN: [id number] Date of Birth: 19-Nov-1924 Referring Provider (PT): Mcarthur Rossetti, MD   Encounter Date: 05/26/2021   PT End of Session - 05/26/21 1122     Visit Number 9    Date for PT Re-Evaluation 06/16/21    Authorization Type Medicare & Aetna    PT Start Time [phone]    PT Stop Time 2376    PT Time Calculation (min) 46 min    Activity Tolerance Patient tolerated treatment well    Behavior During Therapy [facility] for tasks assessed/performed             Past Medical History:  Diagnosis Date   Acute bilateral low back pain without sciatica 08/31/2016   Acute deep vein thrombosis (DVT) of left tibial vein ([place]) 06/26/2017   Acute pain of right wrist 08/31/2016   Aortic atherosclerosis ([place]) 06/25/2017   Arthritis    "right ankle; maybe some in her back" (07/07/2017)   Bradyarrhythmia 06/23/2017   Burning sensation of feet    Bilateral feet   Closed fracture distal radius and ulna, right, initial encounter 08/31/2016   DVT (deep venous thrombosis) ([place]) 06/24/2017   LLE   Essential hypertension 06/23/2017   Frequent urination    Heart block AV second degree 06/24/2017   Hypertension    Osteoarthritis of right hip 03/25/2015   Pacemaker 01/13/2018   Presence of permanent cardiac pacemaker    Pulmonary embolism ([place]) 06/24/2017   Pulmonary hypertension, unspecified ([place]) 12/28/2017   70 mmHg by echo in October 2018   Shortness of breath dyspnea    with exertion   SOB (shortness of breath) on exertion 06/23/2017   Status post total replacement of right hip 03/25/2015    Past Surgical History:  Procedure Laterality Date   ABDOMINAL HYSTERECTOMY     ANKLE FRACTURE SURGERY Right 2003   CATARACT EXTRACTION W/ INTRAOCULAR LENS  IMPLANT, BILATERAL Bilateral    FRACTURE  SURGERY     INSERT / REPLACE / REMOVE PACEMAKER  07/07/2017   JOINT REPLACEMENT     LAPAROSCOPIC CHOLECYSTECTOMY     PACEMAKER IMPLANT N/A 07/07/2017   Procedure: PACEMAKER IMPLANT;  Surgeon: Constance Haw, MD;  Location: [facility];  Service: Cardiovascular;  Laterality: N/A;   TOTAL HIP ARTHROPLASTY Right 03/25/2015   Procedure: RIGHT TOTAL HIP ARTHROPLASTY ANTERIOR APPROACH;  Surgeon: Mcarthur Rossetti, MD;  Location: [facility];  Service: Orthopedics;  Laterality: Right;    There were no vitals filed for this visit.   Subjective Assessment - 05/26/21 0949     Subjective No big issues    Currently in Pain? No/denies                               [facility] Adult PT Treatment/Exercise - 05/26/21 0001       High Level Balance   High Level Balance Comments ball toss, standing on airex  CGA      Knee/Hip Exercises: Aerobic   Nustep L4 x 6 min      Knee/Hip Exercises: Machines for Strengthening   Cybex Leg Press 20# 2x10      Knee/Hip Exercises: Standing   Hip Flexion Both;2 sets;10 reps    Hip Flexion Limitations 2#  Hip Abduction Both;2 sets;10 reps    Abduction Limitations 2#    Other Standing Knee Exercises Alt 4 in box taps SPC x10 each      Knee/Hip Exercises: Seated   Long Arc Quad Strengthening;Both;2 sets;10 reps    Long Arc Quad Weight 3 lbs.    Hamstring Curl Strengthening;Both;2 sets;10 reps    Hamstring Limitations green TB    Sit to [place] 1 set;5 reps;without UE support   pain in the right knee without UE                      PT Short Term Goals - 05/20/21 0933       PT SHORT TERM GOAL #1   Title Patient to be independent with initial HEP.    Status Achieved               PT Long Term Goals - 05/20/21 0933       PT LONG TERM GOAL #1   Title Patient to be independent with advanced HEP.    Time 8    Period Weeks    Status Partially Met   met for current     PT LONG TERM GOAL #2   Title Patient  to demonstrate knee and ankle AROM WFL and without pain limiting.    Time 8    Period Weeks    Status On-going   improved; still limited in R knee flexion     PT LONG TERM GOAL #3   Title Patient to demonstrate B LE strength >/=4/5.    Time 8    Period Weeks    Status On-going   improving, still limited     PT LONG TERM GOAL #4   Title Patient to complete TUG in <14 sec with LRAD in order to decrease risk of falls.    Time 8    Period Weeks    Status On-going   improved but, still >14 sec with and without AD     PT LONG TERM GOAL #5   Title Patient to report tolerance for walking to the mailbox without pain limiting.    Time 8    Period Weeks    Status On-going   has not attempted, but reports that she believes she could do this                  Plan - 05/26/21 1122     Clinical Impression Statement Patient with pain in the right shin and knee area when we tried sit to stand without hands, I raised the seat level and this did not change it, with her hands she does not have the pain, on the stairs she does one at a time and was able to do with the handrails and supervision.  I added the leg press today and she was abe to do wihtout pain, thinking I can build strength here to help witih the sit to stand without pain.    PT Next Visit Plan try to decrease UE use with STS transfers; R knee and ankle ROM to tolerance; LE strengthening,    Consulted and Agree with Plan of Care Patient;Family member/caregiver             Patient will benefit from skilled therapeutic intervention in order to improve the following deficits and impairments:  Abnormal gait, Decreased range of motion, Difficulty walking, Increased fascial restricitons, Decreased endurance, Decreased activity tolerance, Pain, Decreased balance, Hypomobility, Impaired flexibility,  Improper body mechanics, Postural dysfunction, Decreased strength, Increased edema  Visit Diagnosis: Chronic pain of right knee  Pain  in right ankle and joints of right foot  Muscle weakness (generalized)  Stiffness of right ankle, not elsewhere classified  Stiffness of right knee, not elsewhere classified  Difficulty in walking, not elsewhere classified     Problem List Patient Active Problem List   Diagnosis Date Noted   Hypertension    Presence of permanent cardiac pacemaker    Frequent urination    Burning sensation of feet    Arthritis    Pacemaker 01/13/2018   Pulmonary hypertension, unspecified ([place]) 12/28/2017   Acute deep vein thrombosis (DVT) of left tibial vein ([place]) 06/26/2017   Aortic atherosclerosis ([place]) 06/25/2017   Pulmonary embolism ([place]) 06/24/2017   Heart block AV second degree 06/24/2017   DVT (deep venous thrombosis) ([place]) 06/24/2017   SOB (shortness of breath) on exertion 06/23/2017   Bradyarrhythmia 06/23/2017   Essential hypertension 06/23/2017   Acute pain of right wrist 08/31/2016   Acute bilateral low back pain without sciatica 08/31/2016   Closed fracture distal radius and ulna, right, initial encounter 08/31/2016   Osteoarthritis of right hip 03/25/2015   Status post total replacement of right hip 03/25/2015    Sumner Boast, PT 05/26/2021, 11:28 AM  [address] [place], [place], [address] Phone: [phone]   Fax:  [phone]  Name: Lindsey Ross MRN: [id number] Date of Birth: [birth year]

## 2021-05-28 ENCOUNTER — Ambulatory Visit: Payer: Medicare Other | Admitting: Physical Therapy

## 2021-05-28 ENCOUNTER — Other Ambulatory Visit: Payer: Self-pay

## 2021-05-28 DIAGNOSIS — M6281 Muscle weakness (generalized): Secondary | ICD-10-CM

## 2021-05-28 DIAGNOSIS — M25671 Stiffness of right ankle, not elsewhere classified: Secondary | ICD-10-CM | POA: Diagnosis not present

## 2021-05-28 DIAGNOSIS — R262 Difficulty in walking, not elsewhere classified: Secondary | ICD-10-CM

## 2021-05-28 DIAGNOSIS — M25571 Pain in right ankle and joints of right foot: Secondary | ICD-10-CM | POA: Diagnosis not present

## 2021-05-28 DIAGNOSIS — M25661 Stiffness of right knee, not elsewhere classified: Secondary | ICD-10-CM | POA: Diagnosis not present

## 2021-05-28 DIAGNOSIS — G8929 Other chronic pain: Secondary | ICD-10-CM | POA: Diagnosis not present

## 2021-05-28 DIAGNOSIS — M25561 Pain in right knee: Secondary | ICD-10-CM

## 2021-05-28 NOTE — Therapy (Signed)
[name] [place], [place], [address] Phone: [phone]   Fax:  [phone] Progress Note Reporting Period 04/21/21 to 05/28/21  See note below for Objective Data and Assessment of Progress/Goals.     Physical Therapy Treatment  Patient Details  Name: Lindsey Ross MRN: [id number] Date of Birth: [birth year] Referring Provider (PT): Mcarthur Rossetti, MD   Encounter Date: 05/28/2021   PT End of Session - 05/28/21 1102     Visit Number 10    Date for PT Re-Evaluation 06/16/21    PT Start Time 1015    PT Stop Time 1100    PT Time Calculation (min) 45 min             Past Medical History:  Diagnosis Date   Acute bilateral low back pain without sciatica 08/31/2016   Acute deep vein thrombosis (DVT) of left tibial vein ([place]) 06/26/2017   Acute pain of right wrist 08/31/2016   Aortic atherosclerosis ([place]) 06/25/2017   Arthritis    "right ankle; maybe some in her back" (07/07/2017)   Bradyarrhythmia 06/23/2017   Burning sensation of feet    Bilateral feet   Closed fracture distal radius and ulna, right, initial encounter 08/31/2016   DVT (deep venous thrombosis) ([place]) 06/24/2017   LLE   Essential hypertension 06/23/2017   Frequent urination    Heart block AV second degree 06/24/2017   Hypertension    Osteoarthritis of right hip 03/25/2015   Pacemaker 01/13/2018   Presence of permanent cardiac pacemaker    Pulmonary embolism ([place]) 06/24/2017   Pulmonary hypertension, unspecified ([place]) 12/28/2017   70 mmHg by echo in October 2018   Shortness of breath dyspnea    with exertion   SOB (shortness of breath) on exertion 06/23/2017   Status post total replacement of right hip 03/25/2015    Past Surgical History:  Procedure Laterality Date   ABDOMINAL HYSTERECTOMY     ANKLE FRACTURE SURGERY Right 2003   CATARACT EXTRACTION W/ INTRAOCULAR LENS  IMPLANT, BILATERAL Bilateral    FRACTURE SURGERY     INSERT /  REPLACE / REMOVE PACEMAKER  07/07/2017   JOINT REPLACEMENT     LAPAROSCOPIC CHOLECYSTECTOMY     PACEMAKER IMPLANT N/A 07/07/2017   Procedure: PACEMAKER IMPLANT;  Surgeon: Constance Haw, MD;  Location: [facility];  Service: Cardiovascular;  Laterality: N/A;   TOTAL HIP ARTHROPLASTY Right 03/25/2015   Procedure: RIGHT TOTAL HIP ARTHROPLASTY ANTERIOR APPROACH;  Surgeon: Mcarthur Rossetti, MD;  Location: [facility];  Service: Orthopedics;  Laterality: Right;    There were no vitals filed for this visit.   Subjective Assessment - 05/28/21 1017     Subjective had a fall since I was here last. lost balance and fell on left shld- painful    Currently in Pain? No/denies                               [facility] Adult PT Treatment/Exercise - 05/28/21 0001       Knee/Hip Exercises: Aerobic   Nustep L 3 6 min LE only      Knee/Hip Exercises: Machines for Strengthening   Cybex Knee Extension 5# 1 set 8- pain so stopped    Cybex Knee Flexion 15# 2x10      Knee/Hip Exercises: Standing   Other Standing Knee Exercises Alt 4 in box taps SPC x10 each  2x CGA   Other Standing Knee Exercises HHA on airex alt 10 x marching,hip flex,hip ext and hip abd      Knee/Hip Exercises: Seated   Long Arc Quad Strengthening;Both;2 sets;10 reps;Weights    Long Arc Quad Weight 3 lbs.    Other Seated Knee/Hip Exercises shdl shruggs and backward rolls and then MH with LE ex    Marching Strengthening;Both;2 sets;10 reps;Weights    Marching Weights 3 lbs.    Hamstring Curl Strengthening;Both;2 sets;10 reps    Hamstring Limitations green TB    Abduction/Adduction  Strengthening;Both;2 sets;10 reps    Abd/Adduction Weights 3 lbs.                     PT Education - 05/28/21 1102     Education Details fall prevention, how ot get off floor , options for assit=stance    Person(s) Educated Patient;Child(ren)    Methods Explanation    Comprehension Verbalized understanding               PT Short Term Goals - 05/20/21 0933       PT SHORT TERM GOAL #1   Title Patient to be independent with initial HEP.    Status Achieved               PT Long Term Goals - 05/28/21 1029       PT LONG TERM GOAL #1   Title Patient to be independent with advanced HEP.    Status Partially Met      PT LONG TERM GOAL #2   Title Patient to demonstrate knee and ankle AROM WFL and without pain limiting.    Status Partially Met      PT LONG TERM GOAL #3   Title Patient to demonstrate B LE strength >/=4/5.    Status Partially Met      PT LONG TERM GOAL #4   Title Patient to complete TUG in <14 sec with LRAD in order to decrease risk of falls.    Baseline with SPC 21 sec    Status On-going      PT LONG TERM GOAL #5   Title Patient to report tolerance for walking to the mailbox without pain limiting.    Baseline painful    Status Partially Met                   Plan - 05/28/21 1103     Clinical Impression Statement progressing with goals. pt had a fall after last visit, verb lost balance. educ pt and daughter on best way to get up and other safety options with fall prevention. added shld shruggs and rolls as pt was verb guarded on LEft shld ,aos educ on use on ice and or heat. pogressed LE ex, pain with knee ext machine    PT Treatment/Interventions ADLs/Self Care Home Management;Cryotherapy;DME Instruction;Moist Heat;Iontophoresis 48m/ml Dexamethasone;Gait training;Stair training;Functional mobility training;Therapeutic activities;Therapeutic exercise;Balance training;Neuromuscular re-education;Manual techniques;Patient/family education;Passive range of motion;Dry needling;Energy conservation;Vasopneumatic Device;Taping    PT Next Visit Plan try to decrease UE use with STS transfers; R knee and ankle ROM to tolerance; LE strengthening,             Patient will benefit from skilled therapeutic intervention in order to improve the following deficits  and impairments:  Abnormal gait, Decreased range of motion, Difficulty walking, Increased fascial restricitons, Decreased endurance, Decreased activity tolerance, Pain, Decreased balance, Hypomobility, Impaired flexibility, Improper body mechanics, Postural dysfunction, Decreased strength, Increased edema  Visit Diagnosis: Chronic pain of right knee  Muscle weakness (generalized)  Difficulty in walking, not elsewhere classified     Problem List Patient Active Problem List   Diagnosis Date Noted   Hypertension    Presence of permanent cardiac pacemaker    Frequent urination    Burning sensation of feet    Arthritis    Pacemaker 01/13/2018   Pulmonary hypertension, unspecified ([place]) 12/28/2017   Acute deep vein thrombosis (DVT) of left tibial vein ([place]) 06/26/2017   Aortic atherosclerosis ([place]) 06/25/2017   Pulmonary embolism ([place]) 06/24/2017   Heart block AV second degree 06/24/2017   DVT (deep venous thrombosis) ([place]) 06/24/2017   SOB (shortness of breath) on exertion 06/23/2017   Bradyarrhythmia 06/23/2017   Essential hypertension 06/23/2017   Acute pain of right wrist 08/31/2016   Acute bilateral low back pain without sciatica 08/31/2016   Closed fracture distal radius and ulna, right, initial encounter 08/31/2016   Osteoarthritis of right hip 03/25/2015   Status post total replacement of right hip 03/25/2015    Hilliary Jock,ANGIE, PTA 05/28/2021, 11:05 AM  [facility]. [place], [place], [address] Phone: [phone]   Fax:  [phone]  Name: NATAVIA SUBLETTE MRN: [id number] Date of Birth: Oct 22, 1924

## 2021-06-01 ENCOUNTER — Other Ambulatory Visit: Payer: Self-pay

## 2021-06-01 ENCOUNTER — Ambulatory Visit: Payer: Medicare Other | Admitting: Physical Therapy

## 2021-06-01 ENCOUNTER — Encounter: Payer: Self-pay | Admitting: Physical Therapy

## 2021-06-01 DIAGNOSIS — M25571 Pain in right ankle and joints of right foot: Secondary | ICD-10-CM

## 2021-06-01 DIAGNOSIS — M25561 Pain in right knee: Secondary | ICD-10-CM | POA: Diagnosis not present

## 2021-06-01 DIAGNOSIS — M25671 Stiffness of right ankle, not elsewhere classified: Secondary | ICD-10-CM | POA: Diagnosis not present

## 2021-06-01 DIAGNOSIS — M25661 Stiffness of right knee, not elsewhere classified: Secondary | ICD-10-CM

## 2021-06-01 DIAGNOSIS — G8929 Other chronic pain: Secondary | ICD-10-CM | POA: Diagnosis not present

## 2021-06-01 DIAGNOSIS — M6281 Muscle weakness (generalized): Secondary | ICD-10-CM

## 2021-06-01 DIAGNOSIS — R262 Difficulty in walking, not elsewhere classified: Secondary | ICD-10-CM

## 2021-06-01 NOTE — Therapy (Signed)
[facility]. [place], [place], [address] Phone: [phone]   Fax:  [phone]  Physical Therapy Treatment  Patient Details  Name: Lindsey Ross MRN: [id number] Date of Birth: 15-Jun-1925 Referring Provider (PT): Mcarthur Rossetti, MD   Encounter Date: 06/01/2021   PT End of Session - 06/01/21 1134     Visit Number 11    Date for PT Re-Evaluation 06/16/21    Authorization Type Medicare & [employer]et[employer]    PT Start Time 1005    PT Stop Time 1050    PT Time Calculation (min) 45 min    Activity Tolerance Patient tolerated treatment well    Behavior During Therapy [facility] for tasks assessed/performed             Past Medical History:  Diagnosis Date   Acute bilateral low back pain without sciatica 08/31/2016   Acute deep vein thrombosis (DVT) of left tibial vein ([place]) 06/26/2017   Acute pain of right wrist 08/31/2016   Aortic atherosclerosis ([place]) 06/25/2017   Arthritis    "right ankle; maybe some in her back" (07/07/2017)   Bradyarrhythmia 06/23/2017   Burning sensation of feet    Bilateral feet   Closed fracture distal radius and ulna, right, initial encounter 08/31/2016   DVT (deep venous thrombosis) ([place]) 06/24/2017   LLE   Essential hypertension 06/23/2017   Frequent urination    Heart block AV second degree 06/24/2017   Hypertension    Osteoarthritis of right hip 03/25/2015   Pacemaker 01/13/2018   Presence of permanent cardiac pacemaker    Pulmonary embolism ([place]) 06/24/2017   Pulmonary hypertension, unspecified ([place]) 12/28/2017   70 mmHg by echo in October 2018   Shortness of breath dyspnea    with exertion   SOB (shortness of breath) on exertion 06/23/2017   Status post total replacement of right hip 03/25/2015    Past Surgical History:  Procedure Laterality Date   ABDOMINAL HYSTERECTOMY     ANKLE FRACTURE SURGERY Right 2003   CATARACT EXTRACTION W/ INTRAOCULAR LENS  IMPLANT, BILATERAL Bilateral     FRACTURE SURGERY     INSERT / REPLACE / REMOVE PACEMAKER  07/07/2017   JOINT REPLACEMENT     LAPAROSCOPIC CHOLECYSTECTOMY     PACEMAKER IMPLANT N/A 07/07/2017   Procedure: PACEMAKER IMPLANT;  Surgeon: Constance Haw, MD;  Location: [facility];  Service: Cardiovascular;  Laterality: N/A;   TOTAL HIP ARTHROPLASTY Right 03/25/2015   Procedure: RIGHT TOTAL HIP ARTHROPLASTY ANTERIOR APPROACH;  Surgeon: Mcarthur Rossetti, MD;  Location: [facility];  Service: Orthopedics;  Laterality: Right;    There were no vitals filed for this visit.   Subjective Assessment - 06/01/21 1012     Subjective Left shoulder is still very sore from the fall.  She reports that she bent over to pick up something and when she stood up she fell back ward onto the left shoulder    Currently in Pain? Yes    Pain Score 3     Pain Location Shoulder    Pain Orientation Left                               [facility] Adult PT Treatment/Exercise - 06/01/21 0001       Ambulation/Gait   Gait Comments with SPC and HHA outside, curbs and in the grass, had one LOB that required ModA to correct  High Level Balance   High Level Balance Activities Backward walking;Side stepping    High Level Balance Comments cone toe touches, hand cone touches practicing lifting something      Knee/Hip Exercises: Aerobic   Nustep L 4 6 min LE only      Knee/Hip Exercises: Seated   Long Arc Quad Strengthening;Both;2 sets;10 reps;Weights    Long Arc Quad Weight 3 lbs.    Other Seated Knee/Hip Exercises pball in lap isometric abs 2x10, green tband ankle PF/DF 2x10    Marching Strengthening;Both;2 sets;10 reps;Weights    Marching Weights 3 lbs.    Hamstring Curl Strengthening;Both;2 sets;10 reps    Hamstring Limitations green TB                       PT Short Term Goals - 05/20/21 0933       PT SHORT TERM GOAL #1   Title Patient to be independent with initial HEP.    Status Achieved                PT Long Term Goals - 05/28/21 1029       PT LONG TERM GOAL #1   Title Patient to be independent with advanced HEP.    Status Partially Met      PT LONG TERM GOAL #2   Title Patient to demonstrate knee and ankle AROM WFL and without pain limiting.    Status Partially Met      PT LONG TERM GOAL #3   Title Patient to demonstrate B LE strength >/=4/5.    Status Partially Met      PT LONG TERM GOAL #4   Title Patient to complete TUG in <14 sec with LRAD in order to decrease risk of falls.    Baseline with SPC 21 sec    Status On-going      PT LONG TERM GOAL #5   Title Patient to report tolerance for walking to the mailbox without pain limiting.    Baseline painful    Status Partially Met                   Plan - 06/01/21 1134     Clinical Impression Statement Patient had a fall last week, is still hurting in the left shoulder, she has no brusiing, good ROM and strength.  She reports bending over to pick something up off the floor and falling when she stood up, i had her touch cones on the floor with her hand so she would have to bend over and come back up repetetively, she c/o slight dizziness the first rep but less and less as we did more.  I also had her walk in the grass to simulate her going to get the mail, she used the [facility] and did require CGA due to loss of balance    PT Next Visit Plan continue to work on her balance and function    Consulted and Agree with Plan of Care Patient;Family member/caregiver    Family Member Consulted daughter             Patient will benefit from skilled therapeutic intervention in order to improve the following deficits and impairments:  Abnormal gait, Decreased range of motion, Difficulty walking, Increased fascial restricitons, Decreased endurance, Decreased activity tolerance, Pain, Decreased balance, Hypomobility, Impaired flexibility, Improper body mechanics, Postural dysfunction, Decreased strength, Increased  edema  Visit Diagnosis: Chronic pain of right knee  Muscle weakness (generalized)  Difficulty in walking, not elsewhere classified  Pain in right ankle and joints of right foot  Stiffness of right ankle, not elsewhere classified  Stiffness of right knee, not elsewhere classified     Problem List Patient Active Problem List   Diagnosis Date Noted   Hypertension    Presence of permanent cardiac pacemaker    Frequent urination    Burning sensation of feet    Arthritis    Pacemaker 01/13/2018   Pulmonary hypertension, unspecified ([place]) 12/28/2017   Acute deep vein thrombosis (DVT) of left tibial vein ([place]) 06/26/2017   Aortic atherosclerosis ([place]) 06/25/2017   Pulmonary embolism ([place]) 06/24/2017   Heart block AV second degree 06/24/2017   DVT (deep venous thrombosis) ([place]) 06/24/2017   SOB (shortness of breath) on exertion 06/23/2017   Bradyarrhythmia 06/23/2017   Essential hypertension 06/23/2017   Acute pain of right wrist 08/31/2016   Acute bilateral low back pain without sciatica 08/31/2016   Closed fracture distal radius and ulna, right, initial encounter 08/31/2016   Osteoarthritis of right hip 03/25/2015   Status post total replacement of right hip 03/25/2015    Sumner Boast, PT 06/01/2021, 11:37 AM  [facility]. [place], [place], [address] Phone: [phone]   Fax:  [phone]  Name: Lindsey Ross MRN: [id number] Date of Birth: 03-Jun-1925

## 2021-06-03 ENCOUNTER — Ambulatory Visit: Payer: Medicare Other | Admitting: Physical Therapy

## 2021-06-08 ENCOUNTER — Encounter: Payer: Self-pay | Admitting: Physical Therapy

## 2021-06-08 ENCOUNTER — Other Ambulatory Visit: Payer: Self-pay

## 2021-06-08 ENCOUNTER — Ambulatory Visit (INDEPENDENT_AMBULATORY_CARE_PROVIDER_SITE_OTHER): Payer: Medicare Other | Admitting: Orthopaedic Surgery

## 2021-06-08 ENCOUNTER — Ambulatory Visit: Payer: Medicare Other | Admitting: Physical Therapy

## 2021-06-08 ENCOUNTER — Ambulatory Visit: Payer: Self-pay

## 2021-06-08 DIAGNOSIS — M25561 Pain in right knee: Secondary | ICD-10-CM | POA: Diagnosis not present

## 2021-06-08 DIAGNOSIS — M25571 Pain in right ankle and joints of right foot: Secondary | ICD-10-CM

## 2021-06-08 DIAGNOSIS — M25661 Stiffness of right knee, not elsewhere classified: Secondary | ICD-10-CM

## 2021-06-08 DIAGNOSIS — M25671 Stiffness of right ankle, not elsewhere classified: Secondary | ICD-10-CM | POA: Diagnosis not present

## 2021-06-08 DIAGNOSIS — M6281 Muscle weakness (generalized): Secondary | ICD-10-CM | POA: Diagnosis not present

## 2021-06-08 DIAGNOSIS — G8929 Other chronic pain: Secondary | ICD-10-CM | POA: Diagnosis not present

## 2021-06-08 DIAGNOSIS — M25512 Pain in left shoulder: Secondary | ICD-10-CM | POA: Diagnosis not present

## 2021-06-08 DIAGNOSIS — R262 Difficulty in walking, not elsewhere classified: Secondary | ICD-10-CM

## 2021-06-08 MED ORDER — ACETAMINOPHEN-CODEINE #3 300-30 MG PO TABS: 1.0000 | ORAL_TABLET | Freq: Three times a day (TID) | ORAL | 0 refills | Status: DC | PRN

## 2021-06-08 MED ORDER — LIDOCAINE HCL 1 % IJ SOLN
3.0000 mL | INTRAMUSCULAR | Status: AC | PRN
  Administered 2021-06-08: 3 mL

## 2021-06-08 MED ORDER — METHYLPREDNISOLONE ACETATE 40 MG/ML IJ SUSP
40.0000 mg | INTRAMUSCULAR | Status: AC | PRN
  Administered 2021-06-08: 40 mg via INTRA_ARTICULAR

## 2021-06-08 NOTE — Progress Notes (Signed)
Office Visit Note   Patient: Lindsey Ross           Date of Birth: 03/20/1925           MRN: [id number] Visit Date: 06/08/2021              Requested by: Chilton Greathouse, MD [id number] [place],  [place] [address] PCP: Chilton Greathouse, MD   Assessment & Plan: Visit Diagnoses:  1. Acute pain of left shoulder     Plan: Hopefully she is just experiencing ramifications from a contusion to the left shoulder.  I did place a steroid injection to the left shoulder subacromial outlet.  We will try some Tylenol 3 and when she goes back to outpatient physical therapy we will have them work on her shoulder and trapezius area to see if they can decrease the pain in this area and improve the function.  All questions and concerns were answered addressed.  I will see her back in follow-up in about 4 weeks.  Follow-Up Instructions: Return in about 4 weeks (around 07/06/2021).   Orders:  Orders Placed This Encounter  Procedures   Large Joint Inj   XR Shoulder Left   Meds ordered this encounter  Medications   acetaminophen-codeine (TYLENOL #3) 300-30 MG tablet    Sig: Take 1-2 tablets by mouth every 8 (eight) hours as needed.    Dispense:  30 tablet    Refill:  0      Procedures: Large Joint Inj: L subacromial bursa on 06/08/2021 4:00 PM Indications: pain and diagnostic evaluation Details: 22 G 1.5 in needle  Arthrogram: No  Medications: 3 mL lidocaine 1 %; 40 mg methylPREDNISolone acetate 40 MG/ML Outcome: tolerated well, no immediate complications Procedure, treatment alternatives, risks and benefits explained, specific risks discussed. Consent was given by the patient. Immediately prior to procedure a time out was called to verify the correct patient, procedure, equipment, support staff and site/side marked as required. Patient was prepped and draped in the usual sterile fashion.      Clinical Data: No additional findings.   Subjective: Chief Complaint  Patient  presents with   Left Shoulder - Pain  The patient is a 87 year old female we have already been seeing for other issues and she is in outpatient physical therapy.  However she fell about 2 weeks ago injuring her left shoulder.  Has been hurting with reaching above her and behind and there is a constant aching pain that she is experienced with that left shoulder.  She has never injured this shoulder before or had surgery on the shoulder.  She is given a 2 weeks and with the continued pain she felt it was worth being seen today and I agree with this as well.  HPI  Review of Systems There is currently listed no headache, chest pain, shortness of breath, fever, chills, nausea, vomiting  Objective: Vital Signs: There were no vitals taken for this visit.  Physical Exam She is alert and orient x3 and in no acute distress Ortho Exam Examination of her left shoulder shows is clinically well located.  She can reach overhead and abduct the shoulder showing definitely some retained function of the rotator cuff itself. Specialty Comments:  No specialty comments available.  Imaging: XR Shoulder Left  Result Date: 06/08/2021 3 views of the left shoulder show no acute findings.    PMFS History: Patient Active Problem List   Diagnosis Date Noted   Hypertension    Presence of  permanent cardiac pacemaker    Frequent urination    Burning sensation of feet    Arthritis    Pacemaker 01/13/2018   Pulmonary hypertension, unspecified ([facility]) 12/28/2017   Acute deep vein thrombosis (DVT) of left tibial vein ([facility]) 06/26/2017   Aortic atherosclerosis ([facility]) 06/25/2017   Pulmonary embolism ([facility]) 06/24/2017   Heart block AV second degree 06/24/2017   DVT (deep venous thrombosis) ([facility]) 06/24/2017   SOB (shortness of breath) on exertion 06/23/2017   Bradyarrhythmia 06/23/2017   Essential hypertension 06/23/2017   Acute pain of right wrist 08/31/2016   Acute bilateral low back pain without sciatica  08/31/2016   Closed fracture distal radius and ulna, right, initial encounter 08/31/2016   Osteoarthritis of right hip 03/25/2015   Status post total replacement of right hip 03/25/2015   Past Medical History:  Diagnosis Date   Acute bilateral low back pain without sciatica 08/31/2016   Acute deep vein thrombosis (DVT) of left tibial vein ([facility]) 06/26/2017   Acute pain of right wrist 08/31/2016   Aortic atherosclerosis ([facility]) 06/25/2017   Arthritis    "right ankle; maybe some in her back" (07/07/2017)   Bradyarrhythmia 06/23/2017   Burning sensation of feet    Bilateral feet   Closed fracture distal radius and ulna, right, initial encounter 08/31/2016   DVT (deep venous thrombosis) ([facility]) 06/24/2017   LLE   Essential hypertension 06/23/2017   Frequent urination    Heart block AV second degree 06/24/2017   Hypertension    Osteoarthritis of right hip 03/25/2015   Pacemaker 01/13/2018   Presence of permanent cardiac pacemaker    Pulmonary embolism ([facility]) 06/24/2017   Pulmonary hypertension, unspecified ([facility]) 12/28/2017   70 mmHg by echo in October 2018   Shortness of breath dyspnea    with exertion   SOB (shortness of breath) on exertion 06/23/2017   Status post total replacement of right hip 03/25/2015    Family History  Problem Relation Age of Onset   Pulmonary embolism Neg Hx     Past Surgical History:  Procedure Laterality Date   ABDOMINAL HYSTERECTOMY     ANKLE FRACTURE SURGERY Right 2003   CATARACT EXTRACTION W/ INTRAOCULAR LENS  IMPLANT, BILATERAL Bilateral    FRACTURE SURGERY     INSERT / REPLACE / REMOVE PACEMAKER  07/07/2017   JOINT REPLACEMENT     LAPAROSCOPIC CHOLECYSTECTOMY     PACEMAKER IMPLANT N/A 07/07/2017   Procedure: PACEMAKER IMPLANT;  Surgeon: Regan Lemming, MD;  [facility]: [facility];  Service: Cardiovascular;  Laterality: N/A;   TOTAL HIP ARTHROPLASTY Right 03/25/2015   Procedure: RIGHT TOTAL HIP ARTHROPLASTY ANTERIOR APPROACH;  Surgeon: Kathryne Hitch, MD;  Location: [facility];  Service: Orthopedics;  Laterality: Right;   Social History   Occupational History   Not on file  Tobacco Use   Smoking status: Never   Smokeless tobacco: Never  Vaping Use   Vaping Use: Never used  Substance and Sexual Activity   Alcohol use: No   Drug use: No   Sexual activity: Not on file

## 2021-06-08 NOTE — Therapy (Signed)
[place]. [place], [place], [address] Phone: [phone]   Fax:  [phone]  Physical Therapy Treatment  Patient Details  Name: Lindsey Ross MRN: [id number] Date of Birth: [birth year] Referring Provider (PT): Mcarthur Rossetti, MD   Encounter Date: 06/08/2021   PT End of Session - 06/08/21 1008     Visit Number 12    Date for PT Re-Evaluation 06/16/21    Authorization Type Medicare & Aetna    PT Start Time [phone]    PT Stop Time 1009    PT Time Calculation (min) 43 min    Activity Tolerance Patient tolerated treatment well    Behavior During Therapy [facility] for tasks assessed/performed             Past Medical History:  Diagnosis Date   Acute bilateral low back pain without sciatica 08/31/2016   Acute deep vein thrombosis (DVT) of left tibial vein ([place]) 06/26/2017   Acute pain of right wrist 08/31/2016   Aortic atherosclerosis ([place]) 06/25/2017   Arthritis    "right ankle; maybe some in her back" (07/07/2017)   Bradyarrhythmia 06/23/2017   Burning sensation of feet    Bilateral feet   Closed fracture distal radius and ulna, right, initial encounter 08/31/2016   DVT (deep venous thrombosis) ([place]) 06/24/2017   LLE   Essential hypertension 06/23/2017   Frequent urination    Heart block AV second degree 06/24/2017   Hypertension    Osteoarthritis of right hip 03/25/2015   Pacemaker 01/13/2018   Presence of permanent cardiac pacemaker    Pulmonary embolism ([place]) 06/24/2017   Pulmonary hypertension, unspecified ([place]) 12/28/2017   70 mmHg by echo in October 2018   Shortness of breath dyspnea    with exertion   SOB (shortness of breath) on exertion 06/23/2017   Status post total replacement of right hip 03/25/2015    Past Surgical History:  Procedure Laterality Date   ABDOMINAL HYSTERECTOMY     ANKLE FRACTURE SURGERY Right 2003   CATARACT EXTRACTION W/ INTRAOCULAR LENS  IMPLANT, BILATERAL Bilateral     FRACTURE SURGERY     INSERT / REPLACE / REMOVE PACEMAKER  07/07/2017   JOINT REPLACEMENT     LAPAROSCOPIC CHOLECYSTECTOMY     PACEMAKER IMPLANT N/A 07/07/2017   Procedure: PACEMAKER IMPLANT;  Surgeon: Constance Haw, MD;  Location: [place] [facility];  Service: Cardiovascular;  Laterality: N/A;   TOTAL HIP ARTHROPLASTY Right 03/25/2015   Procedure: RIGHT TOTAL HIP ARTHROPLASTY ANTERIOR APPROACH;  Surgeon: Mcarthur Rossetti, MD;  Location: [place];  Service: Orthopedics;  Laterality: Right;    There were no vitals filed for this visit.   Subjective Assessment - 06/08/21 0928     Subjective No falls still left shoulder hurting    Currently in Pain? Yes    Pain Score 5     Pain Location Shoulder    Pain Orientation Left    Pain Descriptors / Indicators Aching;Sore    Aggravating Factors  the fall has made it hurt                [facility] PT Assessment - 06/08/21 0001       High Level Balance   High Level Balance Comments on firmer airex ball toss, right ankle wanted to roll      Timed Up and Go Test   Normal TUG (seconds) 17    TUG Comments no device  [facility] Adult PT Treatment/Exercise - 06/08/21 0001       Ambulation/Gait   Gait Comments HHA fast walking      High Level Balance   High Level Balance Activities Side stepping;Backward walking;Direction changes;Negotiating over obstacles      Knee/Hip Exercises: Aerobic   Nustep L 4 6 min LE only      Knee/Hip Exercises: Machines for Strengthening   Cybex Knee Flexion 20# 2x15    Other Machine 20# resisted gait fwd and backward      Knee/Hip Exercises: Standing   Hip Abduction Both;2 sets;10 reps    Abduction Limitations 2#      Knee/Hip Exercises: Seated   Long Arc Quad Strengthening;Both;2 sets;10 reps;Weights    Long Arc Quad Weight 3 lbs.    Other Seated Knee/Hip Exercises pball in lap isometric abs 2x10, green tband ankle PF/DF 2x10, red tband right ankle  eversion 3x10    Marching Strengthening;Both;2 sets;10 reps;Weights    Marching Weights 3 lbs.                       PT Short Term Goals - 05/20/21 0933       PT SHORT TERM GOAL #1   Title Patient to be independent with initial HEP.    Status Achieved               PT Long Term Goals - 05/28/21 1029       PT LONG TERM GOAL #1   Title Patient to be independent with advanced HEP.    Status Partially Met      PT LONG TERM GOAL #2   Title Patient to demonstrate knee and ankle AROM WFL and without pain limiting.    Status Partially Met      PT LONG TERM GOAL #3   Title Patient to demonstrate B LE strength >/=4/5.    Status Partially Met      PT LONG TERM GOAL #4   Title Patient to complete TUG in <14 sec with LRAD in order to decrease risk of falls.    Baseline with SPC 21 sec    Status On-going      PT LONG TERM GOAL #5   Title Patient to report tolerance for walking to the mailbox without pain limiting.    Baseline painful    Status Partially Met                   Plan - 06/08/21 1009     Clinical Impression Statement patient still having some left shoulder pain from the fall about 2 weeks ago.  She does well and the TUG time improved, on airex she really struggles, she has the right ankle roll under laterally, when I did right ankle eversion she did well with the red tband, still feels unsteady and unsure of herself    PT Next Visit Plan continue to work on her balance and function    Consulted and Agree with Plan of Care Patient;Family member/caregiver    Family Member Consulted daughter             Patient will benefit from skilled therapeutic intervention in order to improve the following deficits and impairments:  Abnormal gait, Decreased range of motion, Difficulty walking, Increased fascial restricitons, Decreased endurance, Decreased activity tolerance, Pain, Decreased balance, Hypomobility, Impaired flexibility, Improper body  mechanics, Postural dysfunction, Decreased strength, Increased edema  Visit Diagnosis: Chronic pain of right knee  Muscle weakness (  generalized)  Difficulty in walking, not elsewhere classified  Pain in right ankle and joints of right foot  Stiffness of right ankle, not elsewhere classified  Stiffness of right knee, not elsewhere classified     Problem List Patient Active Problem List   Diagnosis Date Noted   Hypertension    Presence of permanent cardiac pacemaker    Frequent urination    Burning sensation of feet    Arthritis    Pacemaker 01/13/2018   Pulmonary hypertension, unspecified ([place]) 12/28/2017   Acute deep vein thrombosis (DVT) of left tibial vein ([place]) 06/26/2017   Aortic atherosclerosis ([place]) 06/25/2017   Pulmonary embolism ([place]) 06/24/2017   Heart block AV second degree 06/24/2017   DVT (deep venous thrombosis) ([place]) 06/24/2017   SOB (shortness of breath) on exertion 06/23/2017   Bradyarrhythmia 06/23/2017   Essential hypertension 06/23/2017   Acute pain of right wrist 08/31/2016   Acute bilateral low back pain without sciatica 08/31/2016   Closed fracture distal radius and ulna, right, initial encounter 08/31/2016   Osteoarthritis of right hip 03/25/2015   Status post total replacement of right hip 03/25/2015    Sumner Boast, PT 06/08/2021, 10:11 AM  [address]. [place], [place], [address] Phone: [phone]   Fax:  [phone]  Name: XIMENA TODARO MRN: [id number] Date of Birth: [birth year]

## 2021-06-10 ENCOUNTER — Encounter: Payer: Self-pay | Admitting: Physical Therapy

## 2021-06-10 ENCOUNTER — Other Ambulatory Visit: Payer: Self-pay

## 2021-06-10 ENCOUNTER — Ambulatory Visit: Payer: Medicare Other | Admitting: Physical Therapy

## 2021-06-10 DIAGNOSIS — M25671 Stiffness of right ankle, not elsewhere classified: Secondary | ICD-10-CM | POA: Diagnosis not present

## 2021-06-10 DIAGNOSIS — G8929 Other chronic pain: Secondary | ICD-10-CM | POA: Diagnosis not present

## 2021-06-10 DIAGNOSIS — M6281 Muscle weakness (generalized): Secondary | ICD-10-CM

## 2021-06-10 DIAGNOSIS — M25661 Stiffness of right knee, not elsewhere classified: Secondary | ICD-10-CM | POA: Diagnosis not present

## 2021-06-10 DIAGNOSIS — R262 Difficulty in walking, not elsewhere classified: Secondary | ICD-10-CM

## 2021-06-10 DIAGNOSIS — M25571 Pain in right ankle and joints of right foot: Secondary | ICD-10-CM | POA: Diagnosis not present

## 2021-06-10 DIAGNOSIS — M25561 Pain in right knee: Secondary | ICD-10-CM | POA: Diagnosis not present

## 2021-06-10 NOTE — Therapy (Signed)
[address]. [place], [place], [address] Phone: [phone]   Fax:  [phone]  Physical Therapy Treatment  Patient Details  Name: Lindsey Ross MRN: [id number] Date of Birth: 20-Jul-1925 Referring Provider (PT): Mcarthur Rossetti, MD   Encounter Date: 06/10/2021   PT End of Session - 06/10/21 1013     Visit Number 13    Date for PT Re-Evaluation 06/16/21    Authorization Type Medicare & Aetna    PT Start Time 0930    PT Stop Time 6599    PT Time Calculation (min) 44 min    Activity Tolerance Patient tolerated treatment well    Behavior During Therapy [facility] for tasks assessed/performed             Past Medical History:  Diagnosis Date   Acute bilateral low back pain without sciatica 08/31/2016   Acute deep vein thrombosis (DVT) of left tibial vein ([place]) 06/26/2017   Acute pain of right wrist 08/31/2016   Aortic atherosclerosis ([place]) 06/25/2017   Arthritis    "right ankle; maybe some in her back" (07/07/2017)   Bradyarrhythmia 06/23/2017   Burning sensation of feet    Bilateral feet   Closed fracture distal radius and ulna, right, initial encounter 08/31/2016   DVT (deep venous thrombosis) ([place]) 06/24/2017   LLE   Essential hypertension 06/23/2017   Frequent urination    Heart block AV second degree 06/24/2017   Hypertension    Osteoarthritis of right hip 03/25/2015   Pacemaker 01/13/2018   Presence of permanent cardiac pacemaker    Pulmonary embolism ([place]) 06/24/2017   Pulmonary hypertension, unspecified ([place]) 12/28/2017   70 mmHg by echo in October 2018   Shortness of breath dyspnea    with exertion   SOB (shortness of breath) on exertion 06/23/2017   Status post total replacement of right hip 03/25/2015    Past Surgical History:  Procedure Laterality Date   ABDOMINAL HYSTERECTOMY     ANKLE FRACTURE SURGERY Right 2003   CATARACT EXTRACTION W/ INTRAOCULAR LENS  IMPLANT, BILATERAL Bilateral     FRACTURE SURGERY     INSERT / REPLACE / REMOVE PACEMAKER  07/07/2017   JOINT REPLACEMENT     LAPAROSCOPIC CHOLECYSTECTOMY     PACEMAKER IMPLANT N/A 07/07/2017   Procedure: PACEMAKER IMPLANT;  Surgeon: Constance Haw, MD;  Location: [place] [facility];  Service: Cardiovascular;  Laterality: N/A;   TOTAL HIP ARTHROPLASTY Right 03/25/2015   Procedure: RIGHT TOTAL HIP ARTHROPLASTY ANTERIOR APPROACH;  Surgeon: Mcarthur Rossetti, MD;  Location: [place];  Service: Orthopedics;  Laterality: Right;    There were no vitals filed for this visit.   Subjective Assessment - 06/10/21 0929     Subjective Knee and the ankle are doing pretty good.    Pertinent History L tibial DVT 2018, bradyarrythmia, R radius-ulna fx 2018, HTN, 2nd deg AV heart block, pacemaker, pulmonary HTN, R THA 2016, R ankle fx surgery 2003    Currently in Pain? Yes    Pain Score 2     Pain Location --   knee and ankle   Pain Orientation Right                               [facility] Adult PT Treatment/Exercise - 06/10/21 0001       High Level Balance   High Level Balance Activities Side stepping  High Level Balance Comments Standing cone pick up 3x3, Standing on airex 10 sec x3      Knee/Hip Exercises: Aerobic   Nustep L 3 6 min LE only      Knee/Hip Exercises: Standing   Forward Step Up Both;2 sets;5 reps;Hand Hold: 2;Step Height: 4"    Walking with Sports Cord 10lb forward/backwards x3 each      Knee/Hip Exercises: Seated   Long Arc Quad Strengthening;Both;2 sets;Weights;15 reps    Long Arc Quad Weight 3 lbs.    Other Seated Knee/Hip Exercises bilat resisted ankle eversion red 2x10    Hamstring Curl Strengthening;Both;2 sets;10 reps    Hamstring Limitations green TB                       PT Short Term Goals - 05/20/21 0933       PT SHORT TERM GOAL #1   Title Patient to be independent with initial HEP.    Status Achieved               PT Long Term Goals -  05/28/21 1029       PT LONG TERM GOAL #1   Title Patient to be independent with advanced HEP.    Status Partially Met      PT LONG TERM GOAL #2   Title Patient to demonstrate knee and ankle AROM WFL and without pain limiting.    Status Partially Met      PT LONG TERM GOAL #3   Title Patient to demonstrate B LE strength >/=4/5.    Status Partially Met      PT LONG TERM GOAL #4   Title Patient to complete TUG in <14 sec with LRAD in order to decrease risk of falls.    Baseline with SPC 21 sec    Status On-going      PT LONG TERM GOAL #5   Title Patient to report tolerance for walking to the mailbox without pain limiting.    Baseline painful    Status Partially Met                   Plan - 06/10/21 1014     Clinical Impression Statement Pt reports receiving a injection in her L shoulder yesterday for the pain. She continues to really struggle on non compliant surfaces. Worked on picking up cones from floor without issue. Continued with resisted ankle eversion due to ankles rolling on airex. Some UE use needed with step ups.    Personal Factors and Comorbidities Age;Fitness;Comorbidity 3+;Transportation;Time since onset of injury/illness/exacerbation;Past/Current Experience    Comorbidities L tibial DVT 2018, bradyarrythmia, R radius-ulna fx 2018, HTN, 2nd deg AV heart block, pacemaker, pulmonary HTN, R THA 2016, R ankle fx surgery 2003    Examination-Participation Restrictions Laundry;Shop;Community Activity;Cleaning;Church;Meal Prep    Stability/Clinical Decision Making Stable/Uncomplicated    Rehab Potential Good    PT Frequency 2x / week    PT Duration 2 weeks    PT Treatment/Interventions ADLs/Self Care Home Management;Cryotherapy;DME Instruction;Moist Heat;Iontophoresis 72m/ml Dexamethasone;Gait training;Stair training;Functional mobility training;Therapeutic activities;Therapeutic exercise;Balance training;Neuromuscular re-education;Manual techniques;Patient/family  education;Passive range of motion;Dry needling;Energy conservation;Vasopneumatic Device;Taping    PT Next Visit Plan continue to work on her balance and function             Patient will benefit from skilled therapeutic intervention in order to improve the following deficits and impairments:     Visit Diagnosis: Pain in right ankle and joints of right  foot  Muscle weakness (generalized)  Chronic pain of right knee  Difficulty in walking, not elsewhere classified  Stiffness of right ankle, not elsewhere classified     Problem List Patient Active Problem List   Diagnosis Date Noted   Hypertension    Presence of permanent cardiac pacemaker    Frequent urination    Burning sensation of feet    Arthritis    Pacemaker 01/13/2018   Pulmonary hypertension, unspecified ([place]) 12/28/2017   Acute deep vein thrombosis (DVT) of left tibial vein ([place]) 06/26/2017   Aortic atherosclerosis ([place]) 06/25/2017   Pulmonary embolism ([place]) 06/24/2017   Heart block AV second degree 06/24/2017   DVT (deep venous thrombosis) ([place]) 06/24/2017   SOB (shortness of breath) on exertion 06/23/2017   Bradyarrhythmia 06/23/2017   Essential hypertension 06/23/2017   Acute pain of right wrist 08/31/2016   Acute bilateral low back pain without sciatica 08/31/2016   Closed fracture distal radius and ulna, right, initial encounter 08/31/2016   Osteoarthritis of right hip 03/25/2015   Status post total replacement of right hip 03/25/2015    Scot Jun, PTA 06/10/2021, 10:16 AM  [address]. [place], [place], [address] Phone: [phone]   Fax:  [phone]  Name: Lindsey Ross MRN: [id number] Date of Birth: August 10, 1925

## 2021-06-15 ENCOUNTER — Ambulatory Visit: Payer: Medicare Other | Admitting: Physical Therapy

## 2021-06-15 ENCOUNTER — Encounter: Payer: Self-pay | Admitting: Physical Therapy

## 2021-06-15 ENCOUNTER — Other Ambulatory Visit: Payer: Self-pay

## 2021-06-15 DIAGNOSIS — M6281 Muscle weakness (generalized): Secondary | ICD-10-CM | POA: Diagnosis not present

## 2021-06-15 DIAGNOSIS — G8929 Other chronic pain: Secondary | ICD-10-CM

## 2021-06-15 DIAGNOSIS — M25671 Stiffness of right ankle, not elsewhere classified: Secondary | ICD-10-CM | POA: Diagnosis not present

## 2021-06-15 DIAGNOSIS — M25571 Pain in right ankle and joints of right foot: Secondary | ICD-10-CM

## 2021-06-15 DIAGNOSIS — M25661 Stiffness of right knee, not elsewhere classified: Secondary | ICD-10-CM

## 2021-06-15 DIAGNOSIS — R262 Difficulty in walking, not elsewhere classified: Secondary | ICD-10-CM

## 2021-06-15 DIAGNOSIS — M25561 Pain in right knee: Secondary | ICD-10-CM | POA: Diagnosis not present

## 2021-06-15 NOTE — Therapy (Signed)
[place]. [place], [place], [address] Phone: [phone]   Fax:  [phone]  Physical Therapy Treatment  Patient Details  Name: Lindsey Ross MRN: [id number] Date of Birth: 05/31/1925 Referring Provider (PT): Mcarthur Rossetti, MD   Encounter Date: 06/15/2021   PT End of Session - 06/15/21 1033     Visit Number 14    Date for PT Re-Evaluation 07/21/21    Authorization Type Medicare & Aetna    PT Start Time 0930    PT Stop Time 6010    PT Time Calculation (min) 44 min    Activity Tolerance Patient tolerated treatment well    Behavior During Therapy [facility] for tasks assessed/performed             Past Medical History:  Diagnosis Date   Acute bilateral low back pain without sciatica 08/31/2016   Acute deep vein thrombosis (DVT) of left tibial vein ([place]) 06/26/2017   Acute pain of right wrist 08/31/2016   Aortic atherosclerosis ([place]) 06/25/2017   Arthritis    "right ankle; maybe some in her back" (07/07/2017)   Bradyarrhythmia 06/23/2017   Burning sensation of feet    Bilateral feet   Closed fracture distal radius and ulna, right, initial encounter 08/31/2016   DVT (deep venous thrombosis) ([place]) 06/24/2017   LLE   Essential hypertension 06/23/2017   Frequent urination    Heart block AV second degree 06/24/2017   Hypertension    Osteoarthritis of right hip 03/25/2015   Pacemaker 01/13/2018   Presence of permanent cardiac pacemaker    Pulmonary embolism ([place]) 06/24/2017   Pulmonary hypertension, unspecified ([place]) 12/28/2017   70 mmHg by echo in October 2018   Shortness of breath dyspnea    with exertion   SOB (shortness of breath) on exertion 06/23/2017   Status post total replacement of right hip 03/25/2015    Past Surgical History:  Procedure Laterality Date   ABDOMINAL HYSTERECTOMY     ANKLE FRACTURE SURGERY Right 2003   CATARACT EXTRACTION W/ INTRAOCULAR LENS  IMPLANT, BILATERAL Bilateral     FRACTURE SURGERY     INSERT / REPLACE / REMOVE PACEMAKER  07/07/2017   JOINT REPLACEMENT     LAPAROSCOPIC CHOLECYSTECTOMY     PACEMAKER IMPLANT N/A 07/07/2017   Procedure: PACEMAKER IMPLANT;  Surgeon: Constance Haw, MD;  Location: [facility];  Service: Cardiovascular;  Laterality: N/A;   TOTAL HIP ARTHROPLASTY Right 03/25/2015   Procedure: RIGHT TOTAL HIP ARTHROPLASTY ANTERIOR APPROACH;  Surgeon: Mcarthur Rossetti, MD;  Location: [place];  Service: Orthopedics;  Laterality: Right;    There were no vitals filed for this visit.   Subjective Assessment - 06/15/21 0932     Subjective Reports had injection in the shoulder last week, the soulder feels better, has an order from MD to see the shoulder, she had no pain in the shoulder until she had the fall a few weeks    Currently in Pain? Yes    Pain Score 3     Pain Location Knee    Pain Orientation Right;Left    Aggravating Factors  fall made the shoulder really sore                [facility] PT Assessment - 06/15/21 0001       AROM   AROM Assessment Site Shoulder    Right/Left Shoulder Left    Left Shoulder Flexion 125 Degrees    Left  Shoulder ABduction 125 Degrees    Left Shoulder Internal Rotation 30 Degrees    Left Shoulder External Rotation 40 Degrees      Strength   Overall Strength Comments left shoulder 3+/5 with some pain      Palpation   Palpation comment very tight with tenderness in the upper traps, neck and in the left upper arm                           [facility] Adult PT Treatment/Exercise - 06/15/21 0001       High Level Balance   High Level Balance Activities Backward walking;Direction changes;Side stepping    High Level Balance Comments on airex red tband row, yellow tband extension, on exercise mat for dynamic surface cone tou touches with feet and hands, 360 degree turns, head turns, obstacle course, ball toss, side stpping on the exercise mat      Exercises   Exercises  Shoulder      Knee/Hip Exercises: Aerobic   Nustep L 4 6 min LE only      Shoulder Exercises: Seated   Elevation Left;10 reps    Elevation Weight (lbs) 1    External Rotation Left;20 reps;Weights    External Rotation Weight (lbs) 1    Internal Rotation Left;20 reps;Theraband    Theraband Level (Shoulder Internal Rotation) Level 1 (Yellow)                       PT Short Term Goals - 05/20/21 0933       PT SHORT TERM GOAL #1   Title Patient to be independent with initial HEP.    Status Achieved               PT Long Term Goals - 06/15/21 1238       PT LONG TERM GOAL #1   Title Patient to be independent with advanced HEP.    Status Partially Met      PT LONG TERM GOAL #2   Title Patient to demonstrate knee and ankle AROM WFL and without pain limiting.    Status Partially Met      PT LONG TERM GOAL #3   Title Patient to demonstrate B LE strength >/=4/5.    Status Partially Met      PT LONG TERM GOAL #4   Title Patient to complete TUG in <14 sec with LRAD in order to decrease risk of falls.    Status Partially Met      PT LONG TERM GOAL #5   Title Patient to report tolerance for walking to the mailbox without pain limiting.    Status Partially Met      Additional Long Term Goals   Additional Long Term Goals Yes                   Plan - 06/15/21 1033     Clinical Impression Statement Patient got an injection in the left shoulder last week, she has an order for PT to eval and treat the left shoulder, this shoulder was injured when she fell about 3-4 weeks ago.  The x-rays looked good, the injection has helped the pain, She has ROM that is close to the non affected side but is very weak on the left.  Her biggest issue remains her balance and not feeling safe at times, she is walking with SPC mostly but reports at home she will use  the walls and furniture.  The right ankle is very weak on the dynamic surfaces and it tends to roll laterally     PT Next Visit Plan will add treatment to the left shoulder as needed, continue focus on balance    Consulted and Agree with Plan of Care Patient;Family member/caregiver    Family Member Consulted daughter             Patient will benefit from skilled therapeutic intervention in order to improve the following deficits and impairments:  Abnormal gait, Decreased range of motion, Difficulty walking, Increased fascial restricitons, Decreased endurance, Decreased activity tolerance, Pain, Decreased balance, Hypomobility, Impaired flexibility, Improper body mechanics, Postural dysfunction, Decreased strength, Increased edema  Visit Diagnosis: Pain in right ankle and joints of right foot - Plan: PT plan of care cert/re-cert  Muscle weakness (generalized) - Plan: PT plan of care cert/re-cert  Chronic pain of right knee - Plan: PT plan of care cert/re-cert  Difficulty in walking, not elsewhere classified - Plan: PT plan of care cert/re-cert  Stiffness of right ankle, not elsewhere classified - Plan: PT plan of care cert/re-cert  Stiffness of right knee, not elsewhere classified - Plan: PT plan of care cert/re-cert     Problem List Patient Active Problem List   Diagnosis Date Noted   Hypertension    Presence of permanent cardiac pacemaker    Frequent urination    Burning sensation of feet    Arthritis    Pacemaker 01/13/2018   Pulmonary hypertension, unspecified ([place]) 12/28/2017   Acute deep vein thrombosis (DVT) of left tibial vein ([place]) 06/26/2017   Aortic atherosclerosis ([place]) 06/25/2017   Pulmonary embolism ([place]) 06/24/2017   Heart block AV second degree 06/24/2017   DVT (deep venous thrombosis) ([place]) 06/24/2017   SOB (shortness of breath) on exertion 06/23/2017   Bradyarrhythmia 06/23/2017   Essential hypertension 06/23/2017   Acute pain of right wrist 08/31/2016   Acute bilateral low back pain without sciatica 08/31/2016   Closed fracture distal radius and ulna, right,  initial encounter 08/31/2016   Osteoarthritis of right hip 03/25/2015   Status post total replacement of right hip 03/25/2015    Sumner Boast, PT 06/15/2021, 1:26 PM  [place]. [place], [place], [address] Phone: [phone]   Fax:  [phone]  Name: Lindsey Ross MRN: [id number] Date of Birth: May 21, 1925

## 2021-06-23 ENCOUNTER — Ambulatory Visit: Payer: Medicare Other | Attending: Orthopaedic Surgery | Admitting: Physical Therapy

## 2021-06-23 ENCOUNTER — Encounter: Payer: Self-pay | Admitting: Physical Therapy

## 2021-06-23 ENCOUNTER — Other Ambulatory Visit: Payer: Self-pay

## 2021-06-23 DIAGNOSIS — R262 Difficulty in walking, not elsewhere classified: Secondary | ICD-10-CM | POA: Insufficient documentation

## 2021-06-23 DIAGNOSIS — M25571 Pain in right ankle and joints of right foot: Secondary | ICD-10-CM | POA: Diagnosis not present

## 2021-06-23 DIAGNOSIS — G8929 Other chronic pain: Secondary | ICD-10-CM | POA: Diagnosis not present

## 2021-06-23 DIAGNOSIS — M25671 Stiffness of right ankle, not elsewhere classified: Secondary | ICD-10-CM | POA: Insufficient documentation

## 2021-06-23 DIAGNOSIS — M25561 Pain in right knee: Secondary | ICD-10-CM | POA: Insufficient documentation

## 2021-06-23 DIAGNOSIS — M25661 Stiffness of right knee, not elsewhere classified: Secondary | ICD-10-CM | POA: Diagnosis not present

## 2021-06-23 DIAGNOSIS — M6281 Muscle weakness (generalized): Secondary | ICD-10-CM | POA: Insufficient documentation

## 2021-06-23 NOTE — Therapy (Signed)
[place]. [place], [place], [address] Phone: [phone]   Fax:  [phone]  Physical Therapy Treatment  Patient Details  Name: Lindsey Ross MRN: [id number] Date of Birth: 10/22/1924 Referring Provider (PT): Mcarthur Rossetti, MD   Encounter Date: 06/23/2021   PT End of Session - 06/23/21 1010     Visit Number 15    Date for PT Re-Evaluation 07/21/21    Authorization Type Medicare & Aetna    PT Start Time 0930    PT Stop Time 1012    PT Time Calculation (min) 42 min    Activity Tolerance Patient tolerated treatment well    Behavior During Therapy [facility] for tasks assessed/performed             Past Medical History:  Diagnosis Date   Acute bilateral low back pain without sciatica 08/31/2016   Acute deep vein thrombosis (DVT) of left tibial vein ([place]) 06/26/2017   Acute pain of right wrist 08/31/2016   Aortic atherosclerosis ([place]) 06/25/2017   Arthritis    "right ankle; maybe some in her back" (07/07/2017)   Bradyarrhythmia 06/23/2017   Burning sensation of feet    Bilateral feet   Closed fracture distal radius and ulna, right, initial encounter 08/31/2016   DVT (deep venous thrombosis) ([place]) 06/24/2017   LLE   Essential hypertension 06/23/2017   Frequent urination    Heart block AV second degree 06/24/2017   Hypertension    Osteoarthritis of right hip 03/25/2015   Pacemaker 01/13/2018   Presence of permanent cardiac pacemaker    Pulmonary embolism ([place]) 06/24/2017   Pulmonary hypertension, unspecified ([place]) 12/28/2017   70 mmHg by echo in October 2018   Shortness of breath dyspnea    with exertion   SOB (shortness of breath) on exertion 06/23/2017   Status post total replacement of right hip 03/25/2015    Past Surgical History:  Procedure Laterality Date   ABDOMINAL HYSTERECTOMY     ANKLE FRACTURE SURGERY Right 2003   CATARACT EXTRACTION W/ INTRAOCULAR LENS  IMPLANT, BILATERAL Bilateral     FRACTURE SURGERY     INSERT / REPLACE / REMOVE PACEMAKER  07/07/2017   JOINT REPLACEMENT     LAPAROSCOPIC CHOLECYSTECTOMY     PACEMAKER IMPLANT N/A 07/07/2017   Procedure: PACEMAKER IMPLANT;  Surgeon: Constance Haw, MD;  Location: [place] [facility];  Service: Cardiovascular;  Laterality: N/A;   TOTAL HIP ARTHROPLASTY Right 03/25/2015   Procedure: RIGHT TOTAL HIP ARTHROPLASTY ANTERIOR APPROACH;  Surgeon: Mcarthur Rossetti, MD;  Location: [place];  Service: Orthopedics;  Laterality: Right;    There were no vitals filed for this visit.   Subjective Assessment - 06/23/21 0936     Subjective Doing ok    Currently in Pain? Yes    Pain Score 4     Pain Location Knee    Pain Orientation Right                               [facility] Adult PT Treatment/Exercise - 06/23/21 0001       High Level Balance   High Level Balance Activities Backward walking    High Level Balance Comments side step over stretch strap      Exercises   Exercises Shoulder      Knee/Hip Exercises: Aerobic   Nustep L 4 6 min LE only  Knee/Hip Exercises: Standing   Other Standing Knee Exercises Alt 4 in box taps SPC x10 each   2x CGA     Knee/Hip Exercises: Seated   Long Arc Quad Strengthening;Both;2 sets;Weights;15 reps    Long Arc Quad Weight 3 lbs.    Marching Strengthening;Both;1 set;10 reps    Marching Weights 3 lbs.    Hamstring Curl Strengthening;Both;2 sets;10 reps    Hamstring Limitations green TB      Shoulder Exercises: Seated   Row Strengthening;Both;Theraband;20 reps    Theraband Level (Shoulder Row) Level 1 (Yellow)    Other Seated Exercises LUE ER/IR yellow x 10 each      Shoulder Exercises: Standing   Extension Strengthening;Both;20 reps;Theraband    Theraband Level (Shoulder Extension) Level 1 (Yellow)                       PT Short Term Goals - 05/20/21 0933       PT SHORT TERM GOAL #1   Title Patient to be independent with initial HEP.     Status Achieved               PT Long Term Goals - 06/15/21 1238       PT LONG TERM GOAL #1   Title Patient to be independent with advanced HEP.    Status Partially Met      PT LONG TERM GOAL #2   Title Patient to demonstrate knee and ankle AROM WFL and without pain limiting.    Status Partially Met      PT LONG TERM GOAL #3   Title Patient to demonstrate B LE strength >/=4/5.    Status Partially Met      PT LONG TERM GOAL #4   Title Patient to complete TUG in <14 sec with LRAD in order to decrease risk of falls.    Status Partially Met      PT LONG TERM GOAL #5   Title Patient to report tolerance for walking to the mailbox without pain limiting.    Status Partially Met      Additional Long Term Goals   Additional Long Term Goals Yes                   Plan - 06/23/21 1010     Clinical Impression Statement Pt did well overall. She doe report some muscle aching with RLE hamstring curls. Cue needed to increase step length with side step over strap. Added som light strengthening with LUE without issues. Some weakness present with L shoulder external rotation. Cue for posture and core engagement needed with seated rows.    Personal Factors and Comorbidities Age;Fitness;Comorbidity 3+;Transportation;Time since onset of injury/illness/exacerbation;Past/Current Experience    Comorbidities L tibial DVT 2018, bradyarrythmia, R radius-ulna fx 2018, HTN, 2nd deg AV heart block, pacemaker, pulmonary HTN, R THA 2016, R ankle fx surgery 2003    Examination-Activity Limitations Bathing;Locomotion Level;Transfers;Reach Overhead;Bed Mobility;Bend;Sit;Carry;Squat;Sleep;Stairs;Dressing;Hygiene/Grooming;Stand;Toileting;Lift    Examination-Participation Restrictions Laundry;Shop;Community Activity;Cleaning;Church;Meal Prep    Stability/Clinical Decision Making Stable/Uncomplicated    Rehab Potential Good    PT Frequency 2x / week    PT Duration 2 weeks    PT  Treatment/Interventions ADLs/Self Care Home Management;Cryotherapy;DME Instruction;Moist Heat;Iontophoresis 47m/ml Dexamethasone;Gait training;Stair training;Functional mobility training;Therapeutic activities;Therapeutic exercise;Balance training;Neuromuscular re-education;Manual techniques;Patient/family education;Passive range of motion;Dry needling;Energy conservation;Vasopneumatic Device;Taping    PT Next Visit Plan will add treatment to the left shoulder as needed, continue focus on balance  Patient will benefit from skilled therapeutic intervention in order to improve the following deficits and impairments:  Abnormal gait, Decreased range of motion, Difficulty walking, Increased fascial restricitons, Decreased endurance, Decreased activity tolerance, Pain, Decreased balance, Hypomobility, Impaired flexibility, Improper body mechanics, Postural dysfunction, Decreased strength, Increased edema  Visit Diagnosis: Pain in right ankle and joints of right foot  Difficulty in walking, not elsewhere classified  Stiffness of right ankle, not elsewhere classified  Stiffness of right knee, not elsewhere classified  Chronic pain of right knee  Muscle weakness (generalized)     Problem List Patient Active Problem List   Diagnosis Date Noted   Hypertension    Presence of permanent cardiac pacemaker    Frequent urination    Burning sensation of feet    Arthritis    Pacemaker 01/13/2018   Pulmonary hypertension, unspecified ([place]) 12/28/2017   Acute deep vein thrombosis (DVT) of left tibial vein ([facility]) 06/26/2017   Aortic atherosclerosis ([place]) 06/25/2017   Pulmonary embolism ([place]) 06/24/2017   Heart block AV second degree 06/24/2017   DVT (deep venous thrombosis) ([facility]) 06/24/2017   SOB (shortness of breath) on exertion 06/23/2017   Bradyarrhythmia 06/23/2017   Essential hypertension 06/23/2017   Acute pain of right wrist 08/31/2016   Acute bilateral low back pain without  sciatica 08/31/2016   Closed fracture distal radius and ulna, right, initial encounter 08/31/2016   Osteoarthritis of right hip 03/25/2015   Status post total replacement of right hip 03/25/2015    Scot Jun, PTA 06/23/2021, 10:20 AM  [place]. [place], [place], [address] Phone: [phone]   Fax:  [phone]  Name: Lindsey Ross MRN: [id number] Date of Birth: November 08, 1924

## 2021-06-25 ENCOUNTER — Other Ambulatory Visit: Payer: Self-pay

## 2021-06-25 ENCOUNTER — Ambulatory Visit: Payer: Medicare Other | Admitting: Physical Therapy

## 2021-06-25 DIAGNOSIS — M25571 Pain in right ankle and joints of right foot: Secondary | ICD-10-CM

## 2021-06-25 DIAGNOSIS — M25671 Stiffness of right ankle, not elsewhere classified: Secondary | ICD-10-CM

## 2021-06-25 DIAGNOSIS — M25661 Stiffness of right knee, not elsewhere classified: Secondary | ICD-10-CM

## 2021-06-25 DIAGNOSIS — R262 Difficulty in walking, not elsewhere classified: Secondary | ICD-10-CM | POA: Diagnosis not present

## 2021-06-25 DIAGNOSIS — M25561 Pain in right knee: Secondary | ICD-10-CM | POA: Diagnosis not present

## 2021-06-25 DIAGNOSIS — G8929 Other chronic pain: Secondary | ICD-10-CM | POA: Diagnosis not present

## 2021-06-25 NOTE — Therapy (Signed)
[place]. [place], [place], [address] Phone: [phone]   Fax:  [phone]  Physical Therapy Treatment  Patient Details  Name: GLEN BLATCHLEY MRN: [id number] Date of Birth: [birth year] Referring Provider (PT): Mcarthur Rossetti, MD   Encounter Date: 06/25/2021   PT End of Session - 06/25/21 1012     Visit Number 16    Date for PT Re-Evaluation 07/21/21    Authorization Type Medicare & Aetna    PT Start Time 0930    PT Stop Time 1013    PT Time Calculation (min) 43 min    Activity Tolerance Patient tolerated treatment well    Behavior During Therapy [facility] for tasks assessed/performed             Past Medical History:  Diagnosis Date   Acute bilateral low back pain without sciatica 08/31/2016   Acute deep vein thrombosis (DVT) of left tibial vein ([place]) 06/26/2017   Acute pain of right wrist 08/31/2016   Aortic atherosclerosis ([place]) 06/25/2017   Arthritis    "right ankle; maybe some in her back" (07/07/2017)   Bradyarrhythmia 06/23/2017   Burning sensation of feet    Bilateral feet   Closed fracture distal radius and ulna, right, initial encounter 08/31/2016   DVT (deep venous thrombosis) ([place]) 06/24/2017   LLE   Essential hypertension 06/23/2017   Frequent urination    Heart block AV second degree 06/24/2017   Hypertension    Osteoarthritis of right hip 03/25/2015   Pacemaker 01/13/2018   Presence of permanent cardiac pacemaker    Pulmonary embolism ([place]) 06/24/2017   Pulmonary hypertension, unspecified ([place]) 12/28/2017   70 mmHg by echo in October 2018   Shortness of breath dyspnea    with exertion   SOB (shortness of breath) on exertion 06/23/2017   Status post total replacement of right hip 03/25/2015    Past Surgical History:  Procedure Laterality Date   ABDOMINAL HYSTERECTOMY     ANKLE FRACTURE SURGERY Right 2003   CATARACT EXTRACTION W/ INTRAOCULAR LENS  IMPLANT, BILATERAL Bilateral     FRACTURE SURGERY     INSERT / REPLACE / REMOVE PACEMAKER  07/07/2017   JOINT REPLACEMENT     LAPAROSCOPIC CHOLECYSTECTOMY     PACEMAKER IMPLANT N/A 07/07/2017   Procedure: PACEMAKER IMPLANT;  Surgeon: Constance Haw, MD;  Location: [place] [facility];  Service: Cardiovascular;  Laterality: N/A;   TOTAL HIP ARTHROPLASTY Right 03/25/2015   Procedure: RIGHT TOTAL HIP ARTHROPLASTY ANTERIOR APPROACH;  Surgeon: Mcarthur Rossetti, MD;  Location: [place];  Service: Orthopedics;  Laterality: Right;    There were no vitals filed for this visit.   Subjective Assessment - 06/25/21 0935     Subjective "Feeling good"    Currently in Pain? Yes    Pain Score 2     Pain Location Knee    Pain Orientation Right                               [facility] Adult PT Treatment/Exercise - 06/25/21 0001       Ambulation/Gait   Gait Comments gait without AD 39f, 75 ft      High Level Balance   High Level Balance Activities Side stepping;Backward walking    High Level Balance Comments Standing cone pick up 3x3, Standing on airex 10 sec x3      Knee/Hip Exercises: Aerobic  Nustep L 4 5 min LE only      Knee/Hip Exercises: Standing   Forward Step Up Both;2 sets;5 reps;Hand Hold: 2;Step Height: 4"    Walking with Sports Cord 10lb forward/backwards x3 each    Other Standing Knee Exercises Alt 6 in box taps SPC x10 each      Knee/Hip Exercises: Seated   Sit to Sand 2 sets;5 reps;with UE support                       PT Short Term Goals - 05/20/21 0933       PT SHORT TERM GOAL #1   Title Patient to be independent with initial HEP.    Status Achieved               PT Long Term Goals - 06/15/21 1238       PT LONG TERM GOAL #1   Title Patient to be independent with advanced HEP.    Status Partially Met      PT LONG TERM GOAL #2   Title Patient to demonstrate knee and ankle AROM WFL and without pain limiting.    Status Partially Met      PT LONG TERM  GOAL #3   Title Patient to demonstrate B LE strength >/=4/5.    Status Partially Met      PT LONG TERM GOAL #4   Title Patient to complete TUG in <14 sec with LRAD in order to decrease risk of falls.    Status Partially Met      PT LONG TERM GOAL #5   Title Patient to report tolerance for walking to the mailbox without pain limiting.    Status Partially Met      Additional Long Term Goals   Additional Long Term Goals Yes                   Plan - 06/25/21 1012     Clinical Impression Statement Pt again did well in therapy with a focus on functional interventions. She sis well with sit to stands but unable to complete without UE use. Good stability with light resisted gait. She required both UE assist to complete step ups. No reports of pain. Some visible shaking with airex standing.    Personal Factors and Comorbidities Age;Fitness;Comorbidity 3+;Transportation;Time since onset of injury/illness/exacerbation;Past/Current Experience    Comorbidities L tibial DVT 2018, bradyarrythmia, R radius-ulna fx 2018, HTN, 2nd deg AV heart block, pacemaker, pulmonary HTN, R THA 2016, R ankle fx surgery 2003    Examination-Activity Limitations Bathing;Locomotion Level;Transfers;Reach Overhead;Bed Mobility;Bend;Sit;Carry;Squat;Sleep;Stairs;Dressing;Hygiene/Grooming;Stand;Toileting;Lift    Examination-Participation Restrictions Laundry;Shop;Community Activity;Cleaning;Church;Meal Prep    Stability/Clinical Decision Making Stable/Uncomplicated    Rehab Potential Good    PT Frequency 2x / week    PT Duration 2 weeks    PT Treatment/Interventions ADLs/Self Care Home Management;Cryotherapy;DME Instruction;Moist Heat;Iontophoresis 75m/ml Dexamethasone;Gait training;Stair training;Functional mobility training;Therapeutic activities;Therapeutic exercise;Balance training;Neuromuscular re-education;Manual techniques;Patient/family education;Passive range of motion;Dry needling;Energy  conservation;Vasopneumatic Device;Taping    PT Next Visit Plan will add treatment to the left shoulder as needed, continue focus on balance             Patient will benefit from skilled therapeutic intervention in order to improve the following deficits and impairments:  Abnormal gait, Decreased range of motion, Difficulty walking, Increased fascial restricitons, Decreased endurance, Decreased activity tolerance, Pain, Decreased balance, Hypomobility, Impaired flexibility, Improper body mechanics, Postural dysfunction, Decreased strength, Increased edema  Visit Diagnosis: Pain in right ankle  and joints of right foot  Stiffness of right ankle, not elsewhere classified  Difficulty in walking, not elsewhere classified  Stiffness of right knee, not elsewhere classified     Problem List Patient Active Problem List   Diagnosis Date Noted   Hypertension    Presence of permanent cardiac pacemaker    Frequent urination    Burning sensation of feet    Arthritis    Pacemaker 01/13/2018   Pulmonary hypertension, unspecified ([place]) 12/28/2017   Acute deep vein thrombosis (DVT) of left tibial vein ([place]) 06/26/2017   Aortic atherosclerosis ([place]) 06/25/2017   Pulmonary embolism ([place]) 06/24/2017   Heart block AV second degree 06/24/2017   DVT (deep venous thrombosis) ([place]) 06/24/2017   SOB (shortness of breath) on exertion 06/23/2017   Bradyarrhythmia 06/23/2017   Essential hypertension 06/23/2017   Acute pain of right wrist 08/31/2016   Acute bilateral low back pain without sciatica 08/31/2016   Closed fracture distal radius and ulna, right, initial encounter 08/31/2016   Osteoarthritis of right hip 03/25/2015   Status post total replacement of right hip 03/25/2015    Scot Jun, PTA 06/25/2021, 10:15 AM  [place]. [place], [place], [address] Phone: [phone]   Fax:  [phone]  Name: SHAVON ASHMORE MRN: [id number] Date of Birth: 08-21-1925

## 2021-07-01 ENCOUNTER — Encounter: Payer: Self-pay | Admitting: Physical Therapy

## 2021-07-01 ENCOUNTER — Other Ambulatory Visit: Payer: Self-pay

## 2021-07-01 ENCOUNTER — Ambulatory Visit: Payer: Medicare Other | Admitting: Physical Therapy

## 2021-07-01 DIAGNOSIS — M25671 Stiffness of right ankle, not elsewhere classified: Secondary | ICD-10-CM | POA: Diagnosis not present

## 2021-07-01 DIAGNOSIS — M25561 Pain in right knee: Secondary | ICD-10-CM | POA: Diagnosis not present

## 2021-07-01 DIAGNOSIS — G8929 Other chronic pain: Secondary | ICD-10-CM

## 2021-07-01 DIAGNOSIS — M6281 Muscle weakness (generalized): Secondary | ICD-10-CM

## 2021-07-01 DIAGNOSIS — R262 Difficulty in walking, not elsewhere classified: Secondary | ICD-10-CM

## 2021-07-01 DIAGNOSIS — M25661 Stiffness of right knee, not elsewhere classified: Secondary | ICD-10-CM

## 2021-07-01 DIAGNOSIS — M25571 Pain in right ankle and joints of right foot: Secondary | ICD-10-CM

## 2021-07-01 NOTE — Therapy (Signed)
[place]. [place], [place], [address] Phone: [phone]   Fax:  [phone]  Physical Therapy Treatment  Patient Details  Name: Lindsey Ross MRN: [id number] Date of Birth: 08-Sep-1924 Referring Provider (PT): Mcarthur Rossetti, MD   Encounter Date: 07/01/2021   PT End of Session - 07/01/21 1052     Visit Number 17    Date for PT Re-Evaluation 07/21/21    PT Start Time 0927    PT Stop Time 1012    PT Time Calculation (min) 45 min    Activity Tolerance Patient tolerated treatment well    Behavior During Therapy [facility] for tasks assessed/performed             Past Medical History:  Diagnosis Date   Acute bilateral low back pain without sciatica 08/31/2016   Acute deep vein thrombosis (DVT) of left tibial vein ([place]) 06/26/2017   Acute pain of right wrist 08/31/2016   Aortic atherosclerosis ([place]) 06/25/2017   Arthritis    "right ankle; maybe some in her back" (07/07/2017)   Bradyarrhythmia 06/23/2017   Burning sensation of feet    Bilateral feet   Closed fracture distal radius and ulna, right, initial encounter 08/31/2016   DVT (deep venous thrombosis) ([place]) 06/24/2017   LLE   Essential hypertension 06/23/2017   Frequent urination    Heart block AV second degree 06/24/2017   Hypertension    Osteoarthritis of right hip 03/25/2015   Pacemaker 01/13/2018   Presence of permanent cardiac pacemaker    Pulmonary embolism ([place]) 06/24/2017   Pulmonary hypertension, unspecified ([place]) 12/28/2017   70 mmHg by echo in October 2018   Shortness of breath dyspnea    with exertion   SOB (shortness of breath) on exertion 06/23/2017   Status post total replacement of right hip 03/25/2015    Past Surgical History:  Procedure Laterality Date   ABDOMINAL HYSTERECTOMY     ANKLE FRACTURE SURGERY Right 2003   CATARACT EXTRACTION W/ INTRAOCULAR LENS  IMPLANT, BILATERAL Bilateral    FRACTURE SURGERY     INSERT / REPLACE / REMOVE  PACEMAKER  07/07/2017   JOINT REPLACEMENT     LAPAROSCOPIC CHOLECYSTECTOMY     PACEMAKER IMPLANT N/A 07/07/2017   Procedure: PACEMAKER IMPLANT;  Surgeon: Constance Haw, MD;  Location: [place] [facility];  Service: Cardiovascular;  Laterality: N/A;   TOTAL HIP ARTHROPLASTY Right 03/25/2015   Procedure: RIGHT TOTAL HIP ARTHROPLASTY ANTERIOR APPROACH;  Surgeon: Mcarthur Rossetti, MD;  Location: [place];  Service: Orthopedics;  Laterality: Right;    There were no vitals filed for this visit.   Subjective Assessment - 07/01/21 0928     Subjective No falls, knees are feeling pretty good    Currently in Pain? No/denies                [facility] PT Assessment - 07/01/21 0001       Timed Up and Go Test   Normal TUG (seconds) 16    TUG Comments no device                           [facility] Adult PT Treatment/Exercise - 07/01/21 0001       Ambulation/Gait   Gait Comments gait without device SBA 2x100', 1x200'      High Level Balance   High Level Balance Activities Side stepping;Backward walking;Direction changes;Negotitating around obstacles    High Level  Balance Comments picking up cones with and without cane, walking on the large mat, figure 8's      Knee/Hip Exercises: Aerobic   Nustep L 5 5 min LE only      Knee/Hip Exercises: Seated   Other Seated Knee/Hip Exercises fitter blue band leg press    Other Seated Knee/Hip Exercises sit fit ankle motions, red tband left ankle eversion and DF                       PT Short Term Goals - 05/20/21 0933       PT SHORT TERM GOAL #1   Title Patient to be independent with initial HEP.    Status Achieved               PT Long Term Goals - 07/01/21 1054       PT LONG TERM GOAL #1   Title Patient to be independent with advanced HEP.    Status Partially Met      PT LONG TERM GOAL #2   Title Patient to demonstrate knee and ankle AROM WFL and without pain limiting.    Status Partially Met       PT LONG TERM GOAL #3   Title Patient to demonstrate B LE strength >/=4/5.    Status Partially Met      PT LONG TERM GOAL #4   Title Patient to complete TUG in <14 sec with LRAD in order to decrease risk of falls.    Status Partially Met      PT LONG TERM GOAL #5   Title Patient to report tolerance for walking to the mailbox without pain limiting.    Status Partially Met                   Plan - 07/01/21 1053     Clinical Impression Statement Patient reports doing better, less pain overall, she reports no falls, she improved on the TUG without device, she still has issues with the right ankle rolling laterally on dynamic surfaces, working on this strength and she does well, did talk to her and daughter about a brace but not sure worth it due to she is not up on dynamic surfaces much.  today we did more walking without device with a good pace and less issues    PT Next Visit Plan will add treatment to the left shoulder as needed, continue focus on balance    Consulted and Agree with Plan of Care Patient;Family member/caregiver    Family Member Consulted daughter             Patient will benefit from skilled therapeutic intervention in order to improve the following deficits and impairments:  Abnormal gait, Decreased range of motion, Difficulty walking, Increased fascial restricitons, Decreased endurance, Decreased activity tolerance, Pain, Decreased balance, Hypomobility, Impaired flexibility, Improper body mechanics, Postural dysfunction, Decreased strength, Increased edema  Visit Diagnosis: Pain in right ankle and joints of right foot  Stiffness of right ankle, not elsewhere classified  Difficulty in walking, not elsewhere classified  Stiffness of right knee, not elsewhere classified  Chronic pain of right knee  Muscle weakness (generalized)     Problem List Patient Active Problem List   Diagnosis Date Noted   Hypertension    Presence of permanent  cardiac pacemaker    Frequent urination    Burning sensation of feet    Arthritis    Pacemaker 01/13/2018   Pulmonary hypertension,  unspecified ([place]) 12/28/2017   Acute deep vein thrombosis (DVT) of left tibial vein ([place]) 06/26/2017   Aortic atherosclerosis ([place]) 06/25/2017   Pulmonary embolism ([place]) 06/24/2017   Heart block AV second degree 06/24/2017   DVT (deep venous thrombosis) ([place]) 06/24/2017   SOB (shortness of breath) on exertion 06/23/2017   Bradyarrhythmia 06/23/2017   Essential hypertension 06/23/2017   Acute pain of right wrist 08/31/2016   Acute bilateral low back pain without sciatica 08/31/2016   Closed fracture distal radius and ulna, right, initial encounter 08/31/2016   Osteoarthritis of right hip 03/25/2015   Status post total replacement of right hip 03/25/2015    Sumner Boast, PT 07/01/2021, 10:55 AM  [place]. [place], [place], [address] Phone: [phone]   Fax:  [phone]  Name: Lindsey Ross MRN: [id number] Date of Birth: 1924-12-14

## 2021-07-02 ENCOUNTER — Ambulatory Visit: Payer: Medicare Other | Admitting: Physical Therapy

## 2021-07-02 ENCOUNTER — Encounter: Payer: Self-pay | Admitting: Physical Therapy

## 2021-07-02 DIAGNOSIS — M25661 Stiffness of right knee, not elsewhere classified: Secondary | ICD-10-CM | POA: Diagnosis not present

## 2021-07-02 DIAGNOSIS — M25671 Stiffness of right ankle, not elsewhere classified: Secondary | ICD-10-CM

## 2021-07-02 DIAGNOSIS — M25561 Pain in right knee: Secondary | ICD-10-CM | POA: Diagnosis not present

## 2021-07-02 DIAGNOSIS — G8929 Other chronic pain: Secondary | ICD-10-CM | POA: Diagnosis not present

## 2021-07-02 DIAGNOSIS — M25571 Pain in right ankle and joints of right foot: Secondary | ICD-10-CM

## 2021-07-02 DIAGNOSIS — M6281 Muscle weakness (generalized): Secondary | ICD-10-CM

## 2021-07-02 DIAGNOSIS — R262 Difficulty in walking, not elsewhere classified: Secondary | ICD-10-CM | POA: Diagnosis not present

## 2021-07-02 NOTE — Therapy (Signed)
[place] [place], [place], [address] Phone: [phone]   Fax:  [phone]  Physical Therapy Treatment  Patient Details  Name: Lindsey Ross MRN: [id number] Date of Birth: Jun 06, 1925 Referring Provider (PT): Mcarthur Rossetti, MD   Encounter Date: 07/02/2021   PT End of Session - 07/02/21 1059     Visit Number 18    Date for PT Re-Evaluation 07/21/21    Authorization Type Medicare & Aetna    PT Start Time 1014    PT Stop Time 1059    PT Time Calculation (min) 45 min    Activity Tolerance Patient tolerated treatment well    Behavior During Therapy [facility] for tasks assessed/performed             Past Medical History:  Diagnosis Date   Acute bilateral low back pain without sciatica 08/31/2016   Acute deep vein thrombosis (DVT) of left tibial vein ([place]) 06/26/2017   Acute pain of right wrist 08/31/2016   Aortic atherosclerosis ([place]) 06/25/2017   Arthritis    "right ankle; maybe some in her back" (07/07/2017)   Bradyarrhythmia 06/23/2017   Burning sensation of feet    Bilateral feet   Closed fracture distal radius and ulna, right, initial encounter 08/31/2016   DVT (deep venous thrombosis) ([place]) 06/24/2017   LLE   Essential hypertension 06/23/2017   Frequent urination    Heart block AV second degree 06/24/2017   Hypertension    Osteoarthritis of right hip 03/25/2015   Pacemaker 01/13/2018   Presence of permanent cardiac pacemaker    Pulmonary embolism ([place]) 06/24/2017   Pulmonary hypertension, unspecified ([place]) 12/28/2017   70 mmHg by echo in October 2018   Shortness of breath dyspnea    with exertion   SOB (shortness of breath) on exertion 06/23/2017   Status post total replacement of right hip 03/25/2015    Past Surgical History:  Procedure Laterality Date   ABDOMINAL HYSTERECTOMY     ANKLE FRACTURE SURGERY Right 2003   CATARACT EXTRACTION W/ INTRAOCULAR LENS  IMPLANT, BILATERAL Bilateral     FRACTURE SURGERY     INSERT / REPLACE / REMOVE PACEMAKER  07/07/2017   JOINT REPLACEMENT     LAPAROSCOPIC CHOLECYSTECTOMY     PACEMAKER IMPLANT N/A 07/07/2017   Procedure: PACEMAKER IMPLANT;  Surgeon: Constance Haw, MD;  Location: [place] [facility];  Service: Cardiovascular;  Laterality: N/A;   TOTAL HIP ARTHROPLASTY Right 03/25/2015   Procedure: RIGHT TOTAL HIP ARTHROPLASTY ANTERIOR APPROACH;  Surgeon: Mcarthur Rossetti, MD;  Location: [facility];  Service: Orthopedics;  Laterality: Right;    There were no vitals filed for this visit.   Subjective Assessment - 07/02/21 1016     Subjective I felt pretty good after the treatment    Currently in Pain? No/denies                               [facility] Adult PT Treatment/Exercise - 07/02/21 0001       High Level Balance   High Level Balance Activities Side stepping;Backward walking;Direction changes    High Level Balance Comments fast walking, figure 8's, cone pickups, cone toe touches on solid and dynamic surfaces ball toss, in pbars airex balance beam side stepping and tandem walking      Knee/Hip Exercises: Aerobic   Nustep L 5 5 min LE only      Shoulder  Exercises: Seated   Extension Both;20 reps;Theraband    Theraband Level (Shoulder Extension) Level 1 (Yellow)    Row Strengthening;Both;Theraband;20 reps    Theraband Level (Shoulder Row) Level 1 (Yellow)    External Rotation Both;20 reps    Theraband Level (Shoulder External Rotation) Level 1 (Yellow)                       PT Short Term Goals - 05/20/21 0933       PT SHORT TERM GOAL #1   Title Patient to be independent with initial HEP.    Status Achieved               PT Long Term Goals - 07/02/21 1100       PT LONG TERM GOAL #1   Title Patient to be independent with advanced HEP.    Status Partially Met      PT LONG TERM GOAL #2   Title Patient to demonstrate knee and ankle AROM WFL and without pain limiting.     Status Partially Met                   Plan - 07/02/21 1059     Clinical Impression Statement at 200 feet without device she needs to stop and rest, she still has issue with the right ankle rolling on the dynamic surfaces, however I gave her the cues to push with the foot medially and this seemed to stop this, however when she would firts start she would have the roll    PT Next Visit Plan continue to work on balance and function    Consulted and Agree with Plan of Care Patient;Family member/caregiver    Family Member Consulted daughter             Patient will benefit from skilled therapeutic intervention in order to improve the following deficits and impairments:  Abnormal gait, Decreased range of motion, Difficulty walking, Increased fascial restricitons, Decreased endurance, Decreased activity tolerance, Pain, Decreased balance, Hypomobility, Impaired flexibility, Improper body mechanics, Postural dysfunction, Decreased strength, Increased edema  Visit Diagnosis: Pain in right ankle and joints of right foot  Stiffness of right ankle, not elsewhere classified  Difficulty in walking, not elsewhere classified  Stiffness of right knee, not elsewhere classified  Chronic pain of right knee  Muscle weakness (generalized)     Problem List Patient Active Problem List   Diagnosis Date Noted   Hypertension    Presence of permanent cardiac pacemaker    Frequent urination    Burning sensation of feet    Arthritis    Pacemaker 01/13/2018   Pulmonary hypertension, unspecified ([place]) 12/28/2017   Acute deep vein thrombosis (DVT) of left tibial vein ([place]) 06/26/2017   Aortic atherosclerosis ([place] 06/25/2017   Pulmonary embolism ([place]) 06/24/2017   Heart block AV second degree 06/24/2017   DVT (deep venous thrombosis) ([place]) 06/24/2017   SOB (shortness of breath) on exertion 06/23/2017   Bradyarrhythmia 06/23/2017   Essential hypertension 06/23/2017   Acute pain of right  wrist 08/31/2016   Acute bilateral low back pain without sciatica 08/31/2016   Closed fracture distal radius and ulna, right, initial encounter 08/31/2016   Osteoarthritis of right hip 03/25/2015   Status post total replacement of right hip 03/25/2015    Sumner Boast, PT 07/02/2021, 11:01 AM  [place]. [place], [place], [address] Phone: [phone]   Fax:  [phone]  Name: Lindsey Ross MRN: [id number] Date of Birth: [birth year]

## 2021-07-06 ENCOUNTER — Ambulatory Visit: Payer: Medicare Other | Admitting: Orthopaedic Surgery

## 2021-07-07 ENCOUNTER — Other Ambulatory Visit: Payer: Self-pay

## 2021-07-07 ENCOUNTER — Ambulatory Visit: Payer: Medicare Other | Admitting: Physical Therapy

## 2021-07-07 DIAGNOSIS — G8929 Other chronic pain: Secondary | ICD-10-CM

## 2021-07-07 DIAGNOSIS — M6281 Muscle weakness (generalized): Secondary | ICD-10-CM

## 2021-07-07 DIAGNOSIS — M25661 Stiffness of right knee, not elsewhere classified: Secondary | ICD-10-CM | POA: Diagnosis not present

## 2021-07-07 DIAGNOSIS — R262 Difficulty in walking, not elsewhere classified: Secondary | ICD-10-CM

## 2021-07-07 DIAGNOSIS — M25571 Pain in right ankle and joints of right foot: Secondary | ICD-10-CM | POA: Diagnosis not present

## 2021-07-07 DIAGNOSIS — M25561 Pain in right knee: Secondary | ICD-10-CM | POA: Diagnosis not present

## 2021-07-07 DIAGNOSIS — M25671 Stiffness of right ankle, not elsewhere classified: Secondary | ICD-10-CM | POA: Diagnosis not present

## 2021-07-07 NOTE — Therapy (Signed)
[place]. [place], [place], [address] Phone: [phone]   Fax:  [phone]  Physical Therapy Treatment  Patient Details  Name: Lindsey Ross MRN: [id number] Date of Birth: [birth year] Referring Provider (PT): Mcarthur Rossetti, MD   Encounter Date: 07/07/2021   PT End of Session - 07/07/21 1011     Visit Number 19    Date for PT Re-Evaluation 07/21/21    Authorization Type Medicare & Aetna    PT Start Time 0930    PT Stop Time 1012    PT Time Calculation (min) 42 min             Past Medical History:  Diagnosis Date   Acute bilateral low back pain without sciatica 08/31/2016   Acute deep vein thrombosis (DVT) of left tibial vein ([place]) 06/26/2017   Acute pain of right wrist 08/31/2016   Aortic atherosclerosis ([place]) 06/25/2017   Arthritis    "right ankle; maybe some in her back" (07/07/2017)   Bradyarrhythmia 06/23/2017   Burning sensation of feet    Bilateral feet   Closed fracture distal radius and ulna, right, initial encounter 08/31/2016   DVT (deep venous thrombosis) ([place]) 06/24/2017   LLE   Essential hypertension 06/23/2017   Frequent urination    Heart block AV second degree 06/24/2017   Hypertension    Osteoarthritis of right hip 03/25/2015   Pacemaker 01/13/2018   Presence of permanent cardiac pacemaker    Pulmonary embolism ([place]) 06/24/2017   Pulmonary hypertension, unspecified ([place]) 12/28/2017   70 mmHg by echo in October 2018   Shortness of breath dyspnea    with exertion   SOB (shortness of breath) on exertion 06/23/2017   Status post total replacement of right hip 03/25/2015    Past Surgical History:  Procedure Laterality Date   ABDOMINAL HYSTERECTOMY     ANKLE FRACTURE SURGERY Right 2003   CATARACT EXTRACTION W/ INTRAOCULAR LENS  IMPLANT, BILATERAL Bilateral    FRACTURE SURGERY     INSERT / REPLACE / REMOVE PACEMAKER  07/07/2017   JOINT REPLACEMENT     LAPAROSCOPIC  CHOLECYSTECTOMY     PACEMAKER IMPLANT N/A 07/07/2017   Procedure: PACEMAKER IMPLANT;  Surgeon: Constance Haw, MD;  Location: [facility];  Service: Cardiovascular;  Laterality: N/A;   TOTAL HIP ARTHROPLASTY Right 03/25/2015   Procedure: RIGHT TOTAL HIP ARTHROPLASTY ANTERIOR APPROACH;  Surgeon: Mcarthur Rossetti, MD;  Location: [facility];  Service: Orthopedics;  Laterality: Right;    There were no vitals filed for this visit.   Subjective Assessment - 07/07/21 0933     Subjective "getting there"    Currently in Pain? No/denies                               [facility] Adult PT Treatment/Exercise - 07/07/21 0001       High Level Balance   High Level Balance Activities Marching forwards;Marching backwards;Side stepping   HHA with 3 # ankle wts   High Level Balance Comments tandem gait fwd and back as well as grapevine      Knee/Hip Exercises: Aerobic   Nustep L 5 6 min LE only      Knee/Hip Exercises: Standing   Other Standing Knee Exercises HHA alt hip flex,ext and abd 20 x 3#      Knee/Hip Exercises: Seated   Long Arc Quad Strengthening;Both;2 sets;10 reps  Clamshell with TheraBand Green    Marching Right;Both;10 reps   green tband   Hamstring Curl Strengthening;Both;2 sets;10 reps   green tband                      PT Short Term Goals - 05/20/21 0933       PT SHORT TERM GOAL #1   Title Patient to be independent with initial HEP.    Status Achieved               PT Long Term Goals - 07/02/21 1100       PT LONG TERM GOAL #1   Title Patient to be independent with advanced HEP.    Status Partially Met      PT LONG TERM GOAL #2   Title Patient to demonstrate knee and ankle AROM WFL and without pain limiting.    Status Partially Met                   Plan - 07/07/21 1011     Clinical Impression Statement HHA with all standing ex for strength and balance with freq seated rest required. added 3# ankle wts.  decreased stability with tandem and alt LE ex  cuing needed with ex    PT Treatment/Interventions ADLs/Self Care Home Management;Cryotherapy;DME Instruction;Moist Heat;Iontophoresis 54m/ml Dexamethasone;Gait training;Stair training;Functional mobility training;Therapeutic activities;Therapeutic exercise;Balance training;Neuromuscular re-education;Manual techniques;Patient/family education;Passive range of motion;Dry needling;Energy conservation;Vasopneumatic Device;Taping    PT Next Visit Plan check goals and 10th visit             Patient will benefit from skilled therapeutic intervention in order to improve the following deficits and impairments:  Abnormal gait, Decreased range of motion, Difficulty walking, Increased fascial restricitons, Decreased endurance, Decreased activity tolerance, Pain, Decreased balance, Hypomobility, Impaired flexibility, Improper body mechanics, Postural dysfunction, Decreased strength, Increased edema  Visit Diagnosis: Difficulty in walking, not elsewhere classified  Chronic pain of right knee  Muscle weakness (generalized)     Problem List Patient Active Problem List   Diagnosis Date Noted   Hypertension    Presence of permanent cardiac pacemaker    Frequent urination    Burning sensation of feet    Arthritis    Pacemaker 01/13/2018   Pulmonary hypertension, unspecified ([place] 12/28/2017   Acute deep vein thrombosis (DVT) of left tibial vein ([place] 06/26/2017   Aortic atherosclerosis ([place] 06/25/2017   Pulmonary embolism ([place] 06/24/2017   Heart block AV second degree 06/24/2017   DVT (deep venous thrombosis) ([place] 06/24/2017   SOB (shortness of breath) on exertion 06/23/2017   Bradyarrhythmia 06/23/2017   Essential hypertension 06/23/2017   Acute pain of right wrist 08/31/2016   Acute bilateral low back pain without sciatica 08/31/2016   Closed fracture distal radius and ulna, right, initial encounter 08/31/2016   Osteoarthritis of right hip  03/25/2015   Status post total replacement of right hip 03/25/2015    Jedidiah Demartini,ANGIE, PTA 07/07/2021, 10:13 AM  [place] [address]Phone: [phone]  Fax:  [phone] Name: Lindsey KARAMMRN: [id number] of Birth: 1October 13, 1926

## 2021-07-09 ENCOUNTER — Ambulatory Visit (INDEPENDENT_AMBULATORY_CARE_PROVIDER_SITE_OTHER): Payer: Medicare Other

## 2021-07-09 ENCOUNTER — Other Ambulatory Visit: Payer: Self-pay

## 2021-07-09 ENCOUNTER — Ambulatory Visit: Payer: Medicare Other | Admitting: Physical Therapy

## 2021-07-09 DIAGNOSIS — I441 Atrioventricular block, second degree: Secondary | ICD-10-CM | POA: Diagnosis not present

## 2021-07-09 DIAGNOSIS — R262 Difficulty in walking, not elsewhere classified: Secondary | ICD-10-CM

## 2021-07-09 DIAGNOSIS — M25561 Pain in right knee: Secondary | ICD-10-CM | POA: Diagnosis not present

## 2021-07-09 DIAGNOSIS — M6281 Muscle weakness (generalized): Secondary | ICD-10-CM

## 2021-07-09 DIAGNOSIS — G8929 Other chronic pain: Secondary | ICD-10-CM | POA: Diagnosis not present

## 2021-07-09 DIAGNOSIS — M25571 Pain in right ankle and joints of right foot: Secondary | ICD-10-CM | POA: Diagnosis not present

## 2021-07-09 DIAGNOSIS — M25661 Stiffness of right knee, not elsewhere classified: Secondary | ICD-10-CM | POA: Diagnosis not present

## 2021-07-09 DIAGNOSIS — M25671 Stiffness of right ankle, not elsewhere classified: Secondary | ICD-10-CM | POA: Diagnosis not present

## 2021-07-09 LAB — CUP PACEART REMOTE DEVICE CHECK
Battery Remaining Longevity: 67 mo
Battery Voltage: 2.9 V
Brady Statistic AP VP Percent: 27.72 %
Brady Statistic AP VS Percent: 0 %
Brady Statistic AS VP Percent: 72.24 %
Brady Statistic AS VS Percent: 0.03 %
Brady Statistic RA Percent Paced: 27.26 %
Brady Statistic RV Percent Paced: 99.96 %
Date Time Interrogation Session: 20221116225532
Implantable Lead Implant Date: 20181115
Implantable Lead Implant Date: 20181115
Implantable Lead Location: 753859
Implantable Lead Location: 753860
Implantable Lead Model: 5076
Implantable Lead Model: 5076
Implantable Pulse Generator Implant Date: 20181115
Lead Channel Impedance Value: 323 Ohm
Lead Channel Impedance Value: 399 Ohm
Lead Channel Impedance Value: 399 Ohm
Lead Channel Impedance Value: 475 Ohm
Lead Channel Pacing Threshold Amplitude: 0.75 V
Lead Channel Pacing Threshold Amplitude: 1.25 V
Lead Channel Pacing Threshold Pulse Width: 0.4 ms
Lead Channel Pacing Threshold Pulse Width: 0.4 ms
Lead Channel Sensing Intrinsic Amplitude: 0.5 mV
Lead Channel Sensing Intrinsic Amplitude: 0.5 mV
Lead Channel Sensing Intrinsic Amplitude: 6.5 mV
Lead Channel Setting Pacing Amplitude: 2 V
Lead Channel Setting Pacing Amplitude: 3.25 V
Lead Channel Setting Pacing Pulse Width: 0.4 ms
Lead Channel Setting Sensing Sensitivity: 1.2 mV

## 2021-07-09 NOTE — Therapy (Signed)
[employer] [employer]. [place], [place], [address] Phone: [phone]   Fax:  [phone] Progress Note Reporting Period 06/01/21 to 07/09/21 for visit 11-20  See note below for Objective Data and Assessment of Progress/Goals.     Physical Therapy Treatment  Patient Details  Name: Lindsey Ross MRN: [id number] Date of Birth: 11-Sep-1924 Referring Provider (PT): Mcarthur Rossetti, MD   Encounter Date: 07/09/2021   PT End of Session - 07/09/21 1007     Visit Number 20    Date for PT Re-Evaluation 07/21/21    PT Start Time 0930    PT Stop Time 1010    PT Time Calculation (min) 40 min             Past Medical History:  Diagnosis Date   Acute bilateral low back pain without sciatica 08/31/2016   Acute deep vein thrombosis (DVT) of left tibial vein ([place]) 06/26/2017   Acute pain of right wrist 08/31/2016   Aortic atherosclerosis ([place]) 06/25/2017   Arthritis    "right ankle; maybe some in her back" (07/07/2017)   Bradyarrhythmia 06/23/2017   Burning sensation of feet    Bilateral feet   Closed fracture distal radius and ulna, right, initial encounter 08/31/2016   DVT (deep venous thrombosis) ([place]) 06/24/2017   LLE   Essential hypertension 06/23/2017   Frequent urination    Heart block AV second degree 06/24/2017   Hypertension    Osteoarthritis of right hip 03/25/2015   Pacemaker 01/13/2018   Presence of permanent cardiac pacemaker    Pulmonary embolism ([place]) 06/24/2017   Pulmonary hypertension, unspecified ([place]) 12/28/2017   70 mmHg by echo in October 2018   Shortness of breath dyspnea    with exertion   SOB (shortness of breath) on exertion 06/23/2017   Status post total replacement of right hip 03/25/2015    Past Surgical History:  Procedure Laterality Date   ABDOMINAL HYSTERECTOMY     ANKLE FRACTURE SURGERY Right 2003   CATARACT EXTRACTION W/ INTRAOCULAR LENS  IMPLANT, BILATERAL Bilateral    FRACTURE SURGERY      INSERT / REPLACE / REMOVE PACEMAKER  07/07/2017   JOINT REPLACEMENT     LAPAROSCOPIC CHOLECYSTECTOMY     PACEMAKER IMPLANT N/A 07/07/2017   Procedure: PACEMAKER IMPLANT;  Surgeon: Constance Haw, MD;  Location: [facility];  Service: Cardiovascular;  Laterality: N/A;   TOTAL HIP ARTHROPLASTY Right 03/25/2015   Procedure: RIGHT TOTAL HIP ARTHROPLASTY ANTERIOR APPROACH;  Surgeon: Mcarthur Rossetti, MD;  Location: [facility];  Service: Orthopedics;  Laterality: Right;    There were no vitals filed for this visit.   Subjective Assessment - 07/09/21 0933     Subjective tired and sore from last session but okay now    Currently in Pain? No/denies                               [facility] Adult PT Treatment/Exercise - 07/09/21 0001       Knee/Hip Exercises: Aerobic   Nustep L 5 6 min LE only      Knee/Hip Exercises: Machines for Strengthening   Cybex Knee Extension 5# 2 sets 10    Cybex Knee Flexion 20# 10x, 25# 10 x      Knee/Hip Exercises: Standing   Heel Raises Both;20 reps   on black bar   Walking with Sports Cord 20 # fwd/back  3 x, 2 x each side    Other Standing Knee Exercises 6 inch alt step tap HHA 20 x 2 sets   red tband HHA 20 x alt 3 way   Other Standing Knee Exercises 4 inch fwd step up HHA 2 sets 5      Knee/Hip Exercises: Seated   Sit to Sand 2 sets;5 reps;without UE support   on airex CGA                      PT Short Term Goals - 05/20/21 0933       PT SHORT TERM GOAL #1   Title Patient to be independent with initial HEP.    Status Achieved               PT Long Term Goals - 07/09/21 1007       PT LONG TERM GOAL #1   Title Patient to be independent with advanced HEP.    Status Partially Met      PT LONG TERM GOAL #2   Title Patient to demonstrate knee and ankle AROM WFL and without pain limiting.    Status Partially Met      PT LONG TERM GOAL #3   Title Patient to demonstrate B LE strength >/=4/5.     Status Achieved                   Plan - 07/09/21 1008     Clinical Impression Statement progressed all ex in standing with CG- min A. fatigued and needed cuing but did very well. progressing with goals    PT Treatment/Interventions ADLs/Self Care Home Management;Cryotherapy;DME Instruction;Moist Heat;Iontophoresis 42m/ml Dexamethasone;Gait training;Stair training;Functional mobility training;Therapeutic activities;Therapeutic exercise;Balance training;Neuromuscular re-education;Manual techniques;Patient/family education;Passive range of motion;Dry needling;Energy conservation;Vasopneumatic Device;Taping    PT Next Visit Plan possible D/C             Patient will benefit from skilled therapeutic intervention in order to improve the following deficits and impairments:  Abnormal gait, Decreased range of motion, Difficulty walking, Increased fascial restricitons, Decreased endurance, Decreased activity tolerance, Pain, Decreased balance, Hypomobility, Impaired flexibility, Improper body mechanics, Postural dysfunction, Decreased strength, Increased edema  Visit Diagnosis: Difficulty in walking, not elsewhere classified  Chronic pain of right knee  Muscle weakness (generalized)     Problem List Patient Active Problem List   Diagnosis Date Noted   Hypertension    Presence of permanent cardiac pacemaker    Frequent urination    Burning sensation of feet    Arthritis    Pacemaker 01/13/2018   Pulmonary hypertension, unspecified ([place] 12/28/2017   Acute deep vein thrombosis (DVT) of left tibial vein ([place] 06/26/2017   Aortic atherosclerosis ([place] 06/25/2017   Pulmonary embolism ([place] 06/24/2017   Heart block AV second degree 06/24/2017   DVT (deep venous thrombosis) ([place] 06/24/2017   SOB (shortness of breath) on exertion 06/23/2017   Bradyarrhythmia 06/23/2017   Essential hypertension 06/23/2017   Acute pain of right wrist 08/31/2016   Acute bilateral low back pain  without sciatica 08/31/2016   Closed fracture distal radius and ulna, right, initial encounter 08/31/2016   Osteoarthritis of right hip 03/25/2015   Status post total replacement of right hip 03/25/2015    Yamin Swingler,ANGIE, PTA 07/09/2021, 10:09 AM  [place] [address]Phone: [phone]  Fax:  [phone] Name: Lindsey WEDDINGTONMRN: [id number] of Birth: 112/20/1926

## 2021-07-14 ENCOUNTER — Ambulatory Visit: Payer: Medicare Other | Admitting: Physical Therapy

## 2021-07-14 ENCOUNTER — Other Ambulatory Visit: Payer: Self-pay

## 2021-07-14 DIAGNOSIS — G8929 Other chronic pain: Secondary | ICD-10-CM

## 2021-07-14 DIAGNOSIS — M25571 Pain in right ankle and joints of right foot: Secondary | ICD-10-CM | POA: Diagnosis not present

## 2021-07-14 DIAGNOSIS — M25661 Stiffness of right knee, not elsewhere classified: Secondary | ICD-10-CM | POA: Diagnosis not present

## 2021-07-14 DIAGNOSIS — R262 Difficulty in walking, not elsewhere classified: Secondary | ICD-10-CM | POA: Diagnosis not present

## 2021-07-14 DIAGNOSIS — M25561 Pain in right knee: Secondary | ICD-10-CM | POA: Diagnosis not present

## 2021-07-14 DIAGNOSIS — M25671 Stiffness of right ankle, not elsewhere classified: Secondary | ICD-10-CM | POA: Diagnosis not present

## 2021-07-14 DIAGNOSIS — M6281 Muscle weakness (generalized): Secondary | ICD-10-CM

## 2021-07-14 NOTE — Therapy (Signed)
[place] [place], [place], [address] Phone: [phone]   Fax:  [phone]  Physical Therapy Treatment  Patient Details  Name: Lindsey Ross MRN: [id number] Date of Birth: 1925-04-21 Referring Provider (PT): Mcarthur Rossetti, MD   Encounter Date: 07/14/2021   PT End of Session - 07/14/21 1146     Visit Number 21    PT Start Time 5188    PT Stop Time 1140    PT Time Calculation (min) 43 min    Activity Tolerance Patient tolerated treatment well    Behavior During Therapy [facility] for tasks assessed/performed             Past Medical History:  Diagnosis Date   Acute bilateral low back pain without sciatica 08/31/2016   Acute deep vein thrombosis (DVT) of left tibial vein ([place]) 06/26/2017   Acute pain of right wrist 08/31/2016   Aortic atherosclerosis ([place]) 06/25/2017   Arthritis    "right ankle; maybe some in her back" (07/07/2017)   Bradyarrhythmia 06/23/2017   Burning sensation of feet    Bilateral feet   Closed fracture distal radius and ulna, right, initial encounter 08/31/2016   DVT (deep venous thrombosis) ([place]) 06/24/2017   LLE   Essential hypertension 06/23/2017   Frequent urination    Heart block AV second degree 06/24/2017   Hypertension    Osteoarthritis of right hip 03/25/2015   Pacemaker 01/13/2018   Presence of permanent cardiac pacemaker    Pulmonary embolism ([place]) 06/24/2017   Pulmonary hypertension, unspecified ([place]) 12/28/2017   70 mmHg by echo in October 2018   Shortness of breath dyspnea    with exertion   SOB (shortness of breath) on exertion 06/23/2017   Status post total replacement of right hip 03/25/2015    Past Surgical History:  Procedure Laterality Date   ABDOMINAL HYSTERECTOMY     ANKLE FRACTURE SURGERY Right 2003   CATARACT EXTRACTION W/ INTRAOCULAR LENS  IMPLANT, BILATERAL Bilateral    FRACTURE SURGERY     INSERT / REPLACE / REMOVE PACEMAKER  07/07/2017   JOINT  REPLACEMENT     LAPAROSCOPIC CHOLECYSTECTOMY     PACEMAKER IMPLANT N/A 07/07/2017   Procedure: PACEMAKER IMPLANT;  Surgeon: Constance Haw, MD;  Location: [place] [facility];  Service: Cardiovascular;  Laterality: N/A;   TOTAL HIP ARTHROPLASTY Right 03/25/2015   Procedure: RIGHT TOTAL HIP ARTHROPLASTY ANTERIOR APPROACH;  Surgeon: Mcarthur Rossetti, MD;  Location: [place];  Service: Orthopedics;  Laterality: Right;    There were no vitals filed for this visit.       [facility] PT Assessment - 07/14/21 0001       AROM   Right Knee Flexion 105                                    PT Education - 07/14/21 1153     Education Details Discussed continued HEP, added LLLD knee flexion, some balance HEP, discussed fall prevention in the home and tried to problem solve with them about the shower and a light situation.    Person(s) Educated Patient    Methods Explanation;Demonstration    Comprehension Verbalized understanding              PT Short Term Goals - 05/20/21 0933       PT SHORT TERM GOAL #1   Title Patient to  be independent with initial HEP.    Status Achieved               PT Long Term Goals - 07/14/21 1157       PT LONG TERM GOAL #1   Title Patient to be independent with advanced HEP.    Status Achieved      PT LONG TERM GOAL #2   Title Patient to demonstrate knee and ankle AROM WFL and without pain limiting.    Status Achieved      PT LONG TERM GOAL #3   Title Patient to demonstrate B LE strength >/=4/5.    Status Achieved      PT LONG TERM GOAL #4   Title Patient to complete TUG in <14 sec with LRAD in order to decrease risk of falls.    Status Achieved      PT LONG TERM GOAL #5   Title Patient to report tolerance for walking to the mailbox without pain limiting.    Status Achieved                   Plan - 07/14/21 1147     Clinical Impression Statement Talked with patient and daughter about HEP, safety,  fall risks, did some problem solving about a few issues that they wanted to discuss that they feel are a little unsafe.  We talked about safety with the HEP especially the balance aspect.  They both agreed and all questions were answered.    PT Next Visit Plan d/c goals met    Consulted and Agree with Plan of Care Patient;Family member/caregiver    Family Member Consulted daughter             Patient will benefit from skilled therapeutic intervention in order to improve the following deficits and impairments:  Abnormal gait, Decreased range of motion, Difficulty walking, Increased fascial restricitons, Decreased endurance, Decreased activity tolerance, Pain, Decreased balance, Hypomobility, Impaired flexibility, Improper body mechanics, Postural dysfunction, Decreased strength, Increased edema  Visit Diagnosis: Difficulty in walking, not elsewhere classified  Chronic pain of right knee  Muscle weakness (generalized)  Pain in right ankle and joints of right foot  Stiffness of right ankle, not elsewhere classified  Stiffness of right knee, not elsewhere classified     Problem List Patient Active Problem List   Diagnosis Date Noted   Hypertension    Presence of permanent cardiac pacemaker    Frequent urination    Burning sensation of feet    Arthritis    Pacemaker 01/13/2018   Pulmonary hypertension, unspecified ([place]) 12/28/2017   Acute deep vein thrombosis (DVT) of left tibial vein ([place]) 06/26/2017   Aortic atherosclerosis ([place]) 06/25/2017   Pulmonary embolism ([place]) 06/24/2017   Heart block AV second degree 06/24/2017   DVT (deep venous thrombosis) ([place]) 06/24/2017   SOB (shortness of breath) on exertion 06/23/2017   Bradyarrhythmia 06/23/2017   Essential hypertension 06/23/2017   Acute pain of right wrist 08/31/2016   Acute bilateral low back pain without sciatica 08/31/2016   Closed fracture distal radius and ulna, right, initial encounter 08/31/2016   Osteoarthritis  of right hip 03/25/2015   Status post total replacement of right hip 03/25/2015    Sumner Boast, PT 07/14/2021, 11:58 AM  [place]. [place], [place], [address] Phone: [phone]   Fax:  [phone]  Name: Lindsey Ross MRN: [id number] Date of Birth: [birth year]

## 2021-07-20 NOTE — Progress Notes (Signed)
Remote pacemaker transmission.   

## 2021-10-08 ENCOUNTER — Ambulatory Visit (INDEPENDENT_AMBULATORY_CARE_PROVIDER_SITE_OTHER): Payer: Medicare HMO

## 2021-10-08 DIAGNOSIS — I441 Atrioventricular block, second degree: Secondary | ICD-10-CM | POA: Diagnosis not present

## 2021-10-08 LAB — CUP PACEART REMOTE DEVICE CHECK
Battery Remaining Longevity: 65 mo
Battery Voltage: 2.9 V
Brady Statistic AP VP Percent: 33.99 %
Brady Statistic AP VS Percent: 0 %
Brady Statistic AS VP Percent: 65.96 %
Brady Statistic AS VS Percent: 0.04 %
Brady Statistic RA Percent Paced: 33.26 %
Brady Statistic RV Percent Paced: 99.95 %
Date Time Interrogation Session: 20230216004009
Implantable Lead Implant Date: 20181115
Implantable Lead Implant Date: 20181115
Implantable Lead Location: 753859
Implantable Lead Location: 753860
Implantable Lead Model: 5076
Implantable Lead Model: 5076
Implantable Pulse Generator Implant Date: 20181115
Lead Channel Impedance Value: 323 Ohm
Lead Channel Impedance Value: 380 Ohm
Lead Channel Impedance Value: 456 Ohm
Lead Channel Impedance Value: 532 Ohm
Lead Channel Pacing Threshold Amplitude: 1 V
Lead Channel Pacing Threshold Amplitude: 1.25 V
Lead Channel Pacing Threshold Pulse Width: 0.4 ms
Lead Channel Pacing Threshold Pulse Width: 0.4 ms
Lead Channel Sensing Intrinsic Amplitude: 0.75 mV
Lead Channel Sensing Intrinsic Amplitude: 0.75 mV
Lead Channel Sensing Intrinsic Amplitude: 6.5 mV
Lead Channel Setting Pacing Amplitude: 2 V
Lead Channel Setting Pacing Amplitude: 3.25 V
Lead Channel Setting Pacing Pulse Width: 0.4 ms
Lead Channel Setting Sensing Sensitivity: 1.2 mV

## 2021-10-13 NOTE — Progress Notes (Signed)
Remote pacemaker transmission.   

## 2021-11-12 ENCOUNTER — Ambulatory Visit (INDEPENDENT_AMBULATORY_CARE_PROVIDER_SITE_OTHER): Payer: Medicare HMO | Admitting: Cardiology

## 2021-11-12 ENCOUNTER — Encounter: Payer: Self-pay | Admitting: Cardiology

## 2021-11-12 ENCOUNTER — Other Ambulatory Visit: Payer: Self-pay

## 2021-11-12 VITALS — BP 120/84 | HR 92 | Ht 64.0 in | Wt 162.0 lb

## 2021-11-12 DIAGNOSIS — I10 Essential (primary) hypertension: Secondary | ICD-10-CM

## 2021-11-12 DIAGNOSIS — I441 Atrioventricular block, second degree: Secondary | ICD-10-CM

## 2021-11-12 DIAGNOSIS — Z950 Presence of cardiac pacemaker: Secondary | ICD-10-CM

## 2021-11-12 DIAGNOSIS — R0602 Shortness of breath: Secondary | ICD-10-CM

## 2021-11-12 DIAGNOSIS — I272 Pulmonary hypertension, unspecified: Secondary | ICD-10-CM

## 2021-11-12 NOTE — Patient Instructions (Signed)
Medication Instructions:  ?Your physician recommends that you continue on your current medications as directed. Please refer to the Current Medication list given to you today. ? ?*If you need a refill on your cardiac medications before your next appointment, please call your pharmacy* ? ? ?Lab Work: ?Your physician recommends that you return for lab work in:  ? ?Labs today: BMP ? ?If you have labs (blood work) drawn today and your tests are completely normal, you will receive your results only by: ?MyChart Message (if you have MyChart) OR ?A paper copy in the mail ?If you have any lab test that is abnormal or we need to change your treatment, we will call you to review the results. ? ? ?Testing/Procedures: ?None ? ? ?Follow-Up: ?At [facility], you and your health needs are our priority.  As part of our continuing mission to provide you with exceptional heart care, we have created designated Provider Care Teams.  These Care Teams include your primary Cardiologist (physician) and Advanced Practice Providers (APPs -  Physician Assistants and Nurse Practitioners) who all work together to provide you with the care you need, when you need it. ? ?We recommend signing up for the patient portal called "MyChart".  Sign up information is provided on this After Visit Summary.  MyChart is used to connect with patients for Virtual Visits (Telemedicine).  Patients are able to view lab/test results, encounter notes, upcoming appointments, etc.  Non-urgent messages can be sent to your provider as well.   ?To learn more about what you can do with MyChart, go to [email or web address].   ? ?Your next appointment:   ?6 month(s) ? ?The format for your next appointment:   ?In Person ? ?Provider:   ?Gypsy Balsam, MD  ? ? ?Other Instructions ?None ? ?

## 2021-11-12 NOTE — Progress Notes (Signed)
?Cardiology Office Note:   ? ?Date:  11/12/2021  ? ?ID:  Lindsey Ross, DOB 15-Apr-1925, MRN [id number] ? ?PCP:  Chilton Greathouse, MD  ?Cardiologist:  Gypsy Balsam, MD   ? ?Referring MD: Chilton Greathouse, MD  ? ?Chief Complaint  ?Patient presents with  ? Follow-up  ?I am doing fine ? ?History of Present Illness:   ? ?Lindsey Ross is a 88 y.o. female with past medical history significant for pulmonary emboli, she is anticoagulated with Eliquis 5 mg twice daily.  She is more than 88 years old however her weight is more than 60 kg.  I do not have the recent kidney function therefore we will check to make sure the dose is not too high, also second-degree AV block required pacemaker implantation, essential hypertension. ?She is in my office today for follow-up overall doing well after admit for somebody 83 so she is doing quite well.  She participated in rehab because of some orthopedic limitations she did well now she did exercises at home.  She denies chest pain tightness squeezing pressure burning chest no shortness of breath overall doing well ? ?Past Medical History:  ?Diagnosis Date  ? Acute bilateral low back pain without sciatica 08/31/2016  ? Acute deep vein thrombosis (DVT) of left tibial vein (HCC) 06/26/2017  ? Acute pain of right wrist 08/31/2016  ? Aortic atherosclerosis (HCC) 06/25/2017  ? Arthritis   ? "right ankle; maybe some in her back" (07/07/2017)  ? Bradyarrhythmia 06/23/2017  ? Burning sensation of feet   ? Bilateral feet  ? Closed fracture distal radius and ulna, right, initial encounter 08/31/2016  ? DVT (deep venous thrombosis) (HCC) 06/24/2017  ? LLE  ? Essential hypertension 06/23/2017  ? Frequent urination   ? Heart block AV second degree 06/24/2017  ? Hypertension   ? Osteoarthritis of right hip 03/25/2015  ? Pacemaker 01/13/2018  ? Presence of permanent cardiac pacemaker   ? Pulmonary embolism (HCC) 06/24/2017  ? Pulmonary hypertension, unspecified (HCC) 12/28/2017  ? 70 mmHg by echo in October  2018  ? Shortness of breath dyspnea   ? with exertion  ? SOB (shortness of breath) on exertion 06/23/2017  ? Status post total replacement of right hip 03/25/2015  ? ? ?Past Surgical History:  ?Procedure Laterality Date  ? ABDOMINAL HYSTERECTOMY    ? ANKLE FRACTURE SURGERY Right 2003  ? CATARACT EXTRACTION W/ INTRAOCULAR LENS  IMPLANT, BILATERAL Bilateral   ? FRACTURE SURGERY    ? INSERT / REPLACE / REMOVE PACEMAKER  07/07/2017  ? JOINT REPLACEMENT    ? LAPAROSCOPIC CHOLECYSTECTOMY    ? PACEMAKER IMPLANT N/A 07/07/2017  ? Procedure: PACEMAKER IMPLANT;  Surgeon: Regan Lemming, MD;  [facility]: [facility];  Service: Cardiovascular;  Laterality: N/A;  ? TOTAL HIP ARTHROPLASTY Right 03/25/2015  ? Procedure: RIGHT TOTAL HIP ARTHROPLASTY ANTERIOR APPROACH;  Surgeon: Kathryne Hitch, MD;  Location: [facility];  Service: Orthopedics;  Laterality: Right;  ? ? ?Current Medications: ?Current Meds  ?Medication Sig  ? acetaminophen (TYLENOL) 500 MG tablet Take 1,000 mg by mouth every 6 (six) hours as needed for headache (pain).  ? acetaminophen-codeine (TYLENOL #3) 300-30 MG tablet Take 1-2 tablets by mouth every 8 (eight) hours as needed. (Patient taking differently: Take 1-2 tablets by mouth every 8 (eight) hours as needed for moderate pain or severe pain.)  ? albuterol (PROVENTIL HFA;VENTOLIN HFA) 108 (90 Base) MCG/ACT inhaler Inhale 1-2 puffs into the lungs every 6 (six) hours as  needed for wheezing or shortness of breath.  ? apixaban (ELIQUIS) 5 MG TABS tablet Take 5 mg by mouth 2 (two) times daily.  ? Calcium Carb-Cholecalciferol (CALCIUM + D3 PO) Take 1 tablet by mouth daily. Unknown strength  ? diclofenac Sodium (VOLTAREN) 1 % GEL Apply 2 g topically 4 (four) times daily.  ? furosemide (LASIX) 40 MG tablet Take 1 tablet (40 mg total) by mouth daily.  ? gabapentin (NEURONTIN) 100 MG capsule Take 200 mg by mouth 3 (three) times daily.  ? Multiple Vitamins-Minerals (MULTIVITAMIN WITH MINERALS) tablet Take 1 tablet  by mouth daily. Unknown strength  ? pantoprazole (PROTONIX) 40 MG tablet Take 40 mg by mouth daily.  ? telmisartan-hydrochlorothiazide (MICARDIS HCT) 80-25 MG tablet Take 1 tablet by mouth daily.  ?  ? ?Allergies:   Alendronate sodium  ? ?Social History  ? ?Socioeconomic History  ? Marital status: Married  ?  Spouse name: Not on file  ? Number of children: Not on file  ? Years of education: Not on file  ? Highest education level: Not on file  ?Occupational History  ? Not on file  ?Tobacco Use  ? Smoking status: Never  ? Smokeless tobacco: Never  ?Vaping Use  ? Vaping Use: Never used  ?Substance and Sexual Activity  ? Alcohol use: No  ? Drug use: No  ? Sexual activity: Not on file  ?Other Topics Concern  ? Not on file  ?Social History Narrative  ? Not on file  ? ?Social Determinants of Health  ? ?Financial Resource Strain: Not on file  ?Food Insecurity: Not on file  ?Transportation Needs: Not on file  ?Physical Activity: Not on file  ?Stress: Not on file  ?Social Connections: Not on file  ?  ? ?Family History: ?The patient's family history is negative for Pulmonary embolism. ?ROS:   ?Please see the history of present illness.    ?All 14 point review of systems negative except as described per history of present illness ? ?EKGs/Labs/Other Studies Reviewed:   ? ? ? ?Recent Labs: ?02/17/2021: BUN 31; Creatinine, Ser 0.96; Potassium 4.2; Sodium 139  ?Recent Lipid Panel ?No results found for: CHOL, TRIG, HDL, CHOLHDL, VLDL, LDLCALC, LDLDIRECT ? ?Physical Exam:   ? ?VS:  BP 120/84 (BP Location: Left Arm, Patient Position: Sitting)   Pulse 92   Ht 5\' 4"  (1.626 m)   Wt 162 lb (73.5 kg)   SpO2 94%   BMI 27.81 kg/m?    ? ?Wt Readings from Last 3 Encounters:  ?11/12/21 162 lb (73.5 kg)  ?05/06/21 162 lb (73.5 kg)  ?02/09/21 165 lb (74.8 kg)  ?  ? ?GEN:  Well nourished, well developed in no acute distress ?HEENT: Normal ?NECK: No JVD; No carotid bruits ?LYMPHATICS: No lymphadenopathy ?CARDIAC: RRR, no murmurs, no rubs, no  gallops ?RESPIRATORY:  Clear to auscultation without rales, wheezing or rhonchi  ?ABDOMEN: Soft, non-tender, non-distended ?MUSCULOSKELETAL:  No edema; No deformity  ?SKIN: Warm and dry ?LOWER EXTREMITIES: no swelling ?NEUROLOGIC:  Alert and oriented x 3 ?PSYCHIATRIC:  Normal affect  ? ?ASSESSMENT:   ? ?1. Essential hypertension   ?2. Pulmonary hypertension (HCC)   ?3. Second degree atrioventricular block   ?4. Pacemaker   ?5. Shortness of breath   ? ?PLAN:   ? ?In order of problems listed above: ? ?Essential hypertension blood pressure well controlled I will continue present management. ?History of PE she is anticoagulated which I will continue check Chem-7 make sure the dose is not  excessive. ?History of high degree AV block required pacing.  I did review interrogation of the device which is a Medtronic device, we have 5.4 years left in the device. ?Shortness of breath unchanged.  Continue present management. ? ? ?Medication Adjustments/Labs and Tests Ordered: ?Current medicines are reviewed at length with the patient today.  Concerns regarding medicines are outlined above.  ?No orders of the defined types were placed in this encounter. ? ?Medication changes: No orders of the defined types were placed in this encounter. ? ? ?Signed, ?Georgeanna Leaobert J. Shalini Mair, MD, [facility] ?11/12/2021 1:21 PM    ?[facility] ?

## 2021-11-13 LAB — BASIC METABOLIC PANEL
BUN/Creatinine Ratio: 33 — ABNORMAL HIGH (ref 12–28)
BUN: 37 mg/dL — ABNORMAL HIGH (ref 10–36)
CO2: 28 mmol/L (ref 20–29)
Calcium: 10.6 mg/dL — ABNORMAL HIGH (ref 8.7–10.3)
Chloride: 96 mmol/L (ref 96–106)
Creatinine, Ser: 1.11 mg/dL — ABNORMAL HIGH (ref 0.57–1.00)
Glucose: 106 mg/dL — ABNORMAL HIGH (ref 70–99)
Potassium: 4.7 mmol/L (ref 3.5–5.2)
Sodium: 138 mmol/L (ref 134–144)
eGFR: 46 mL/min/{1.73_m2} — ABNORMAL LOW (ref >59)

## 2021-11-19 ENCOUNTER — Telehealth: Payer: Self-pay

## 2021-11-19 NOTE — Telephone Encounter (Signed)
-----   Message from Georgeanna Lea, MD sent at 11/19/2021 10:46 AM EDT ----- ?Labs are good, continue present management ?

## 2021-11-19 NOTE — Telephone Encounter (Signed)
Patient notified of results.

## 2022-01-07 ENCOUNTER — Ambulatory Visit (INDEPENDENT_AMBULATORY_CARE_PROVIDER_SITE_OTHER): Payer: Medicare HMO

## 2022-01-07 DIAGNOSIS — I441 Atrioventricular block, second degree: Secondary | ICD-10-CM | POA: Diagnosis not present

## 2022-01-08 LAB — CUP PACEART REMOTE DEVICE CHECK
Battery Remaining Longevity: 70 mo
Battery Voltage: 2.9 V
Brady Statistic AP VP Percent: 38.56 %
Brady Statistic AP VS Percent: 0 %
Brady Statistic AS VP Percent: 61.39 %
Brady Statistic AS VS Percent: 0.05 %
Brady Statistic RA Percent Paced: 37.65 %
Brady Statistic RV Percent Paced: 99.91 %
Date Time Interrogation Session: 20230518015142
Implantable Lead Implant Date: 20181115
Implantable Lead Implant Date: 20181115
Implantable Lead Location: 753859
Implantable Lead Location: 753860
Implantable Lead Model: 5076
Implantable Lead Model: 5076
Implantable Pulse Generator Implant Date: 20181115
Lead Channel Impedance Value: 323 Ohm
Lead Channel Impedance Value: 418 Ohm
Lead Channel Impedance Value: 494 Ohm
Lead Channel Impedance Value: 513 Ohm
Lead Channel Pacing Threshold Amplitude: 0.875 V
Lead Channel Pacing Threshold Amplitude: 1.125 V
Lead Channel Pacing Threshold Pulse Width: 0.4 ms
Lead Channel Pacing Threshold Pulse Width: 0.4 ms
Lead Channel Sensing Intrinsic Amplitude: 0.75 mV
Lead Channel Sensing Intrinsic Amplitude: 0.75 mV
Lead Channel Sensing Intrinsic Amplitude: 9.625 mV
Lead Channel Sensing Intrinsic Amplitude: 9.625 mV
Lead Channel Setting Pacing Amplitude: 2 V
Lead Channel Setting Pacing Amplitude: 2.75 V
Lead Channel Setting Pacing Pulse Width: 0.4 ms
Lead Channel Setting Sensing Sensitivity: 1.2 mV

## 2022-01-15 NOTE — Progress Notes (Signed)
Remote pacemaker transmission.   

## 2022-02-02 ENCOUNTER — Ambulatory Visit (INDEPENDENT_AMBULATORY_CARE_PROVIDER_SITE_OTHER): Payer: Medicare HMO | Admitting: Orthopaedic Surgery

## 2022-02-02 ENCOUNTER — Encounter: Payer: Self-pay | Admitting: Orthopaedic Surgery

## 2022-02-02 DIAGNOSIS — M1711 Unilateral primary osteoarthritis, right knee: Secondary | ICD-10-CM | POA: Diagnosis not present

## 2022-02-02 MED ORDER — LIDOCAINE HCL 1 % IJ SOLN
3.0000 mL | INTRAMUSCULAR | Status: AC | PRN
  Administered 2022-02-02: 3 mL

## 2022-02-02 MED ORDER — METHYLPREDNISOLONE ACETATE 40 MG/ML IJ SUSP
40.0000 mg | INTRAMUSCULAR | Status: AC | PRN
  Administered 2022-02-02: 40 mg via INTRA_ARTICULAR

## 2022-02-02 NOTE — Progress Notes (Signed)
   Procedure Note  Patient: Lindsey Ross             Date of Birth: [birth year]           MRN: [id number]             Visit Date: 02/02/2022 HPI: Mrs. Doubleday comes in today with right knee and right ankle pain.  She last was seen for right knee in September 2022 and was given a cortisone injection in the knee at that time also given an injection in her right ankle for posttraumatic arthritis.  She does report that she is borderline diabetic.  She is unsure of her last glucose numbers.  She denies any fevers or chills.  No new injury to the right knee.  States that just over the last few months it started bothering her.  Reports good relief with the last injection in the right knee.  Review of systems: See HPI  Physical exam: General well-developed well-nourished female no acute distress ambulates with a cane.  Right knee good range of motion.  No abnormal warmth erythema or effusion.  Valgus deformity.  No gross instability.   Procedures: Visit Diagnoses:  1. Unilateral primary osteoarthritis, right knee     Large Joint Inj: R knee on 02/02/2022 10:48 AM Indications: pain Details: 22 G 1.5 in needle, anterolateral approach  Arthrogram: No  Medications: 3 mL lidocaine 1 %; 40 mg methylPREDNISolone acetate 40 MG/ML Outcome: tolerated well, no immediate complications Procedure, treatment alternatives, risks and benefits explained, specific risks discussed. Consent was given by the patient. Immediately prior to procedure a time out was called to verify the correct patient, procedure, equipment, support staff and site/side marked as required. Patient was prepped and draped in the usual sterile fashion.     Plan: After discussing with her the possibility of cortisone causing her glucose levels to rise she decided to just go with a right knee injection today.  We will see her back in 2 weeks in place and then sectioning her right ankle.  She tolerated the right knee injection well  today.

## 2022-03-31 ENCOUNTER — Other Ambulatory Visit: Payer: Self-pay

## 2022-03-31 MED ORDER — FUROSEMIDE 40 MG PO TABS: 40.0000 mg | ORAL_TABLET | Freq: Every day | ORAL | 1 refills | Status: DC

## 2022-04-08 ENCOUNTER — Ambulatory Visit (INDEPENDENT_AMBULATORY_CARE_PROVIDER_SITE_OTHER): Payer: Medicare HMO

## 2022-04-08 DIAGNOSIS — I441 Atrioventricular block, second degree: Secondary | ICD-10-CM

## 2022-04-08 LAB — CUP PACEART REMOTE DEVICE CHECK
Battery Remaining Longevity: 55 mo
Battery Voltage: 2.89 V
Brady Statistic AP VP Percent: 38.1 %
Brady Statistic AP VS Percent: 0 %
Brady Statistic AS VP Percent: 61.86 %
Brady Statistic AS VS Percent: 0.03 %
Brady Statistic RA Percent Paced: 37.25 %
Brady Statistic RV Percent Paced: 99.97 %
Date Time Interrogation Session: 20230817001812
Implantable Lead Implant Date: 20181115
Implantable Lead Implant Date: 20181115
Implantable Lead Location: 753859
Implantable Lead Location: 753860
Implantable Lead Model: 5076
Implantable Lead Model: 5076
Implantable Pulse Generator Implant Date: 20181115
Lead Channel Impedance Value: 304 Ohm
Lead Channel Impedance Value: 399 Ohm
Lead Channel Impedance Value: 494 Ohm
Lead Channel Impedance Value: 494 Ohm
Lead Channel Pacing Threshold Amplitude: 1.125 V
Lead Channel Pacing Threshold Amplitude: 1.25 V
Lead Channel Pacing Threshold Pulse Width: 0.4 ms
Lead Channel Pacing Threshold Pulse Width: 0.4 ms
Lead Channel Sensing Intrinsic Amplitude: 0.625 mV
Lead Channel Sensing Intrinsic Amplitude: 0.625 mV
Lead Channel Sensing Intrinsic Amplitude: 9.625 mV
Lead Channel Sensing Intrinsic Amplitude: 9.625 mV
Lead Channel Setting Pacing Amplitude: 2.25 V
Lead Channel Setting Pacing Amplitude: 3.25 V
Lead Channel Setting Pacing Pulse Width: 0.4 ms
Lead Channel Setting Sensing Sensitivity: 1.2 mV

## 2022-04-27 ENCOUNTER — Encounter: Payer: Self-pay | Admitting: Cardiology

## 2022-04-27 ENCOUNTER — Ambulatory Visit: Payer: Medicare HMO | Attending: Cardiology | Admitting: Cardiology

## 2022-04-27 VITALS — BP 122/78 | HR 73 | Ht 63.0 in | Wt 162.0 lb

## 2022-04-27 DIAGNOSIS — I441 Atrioventricular block, second degree: Secondary | ICD-10-CM | POA: Diagnosis not present

## 2022-04-27 DIAGNOSIS — Z950 Presence of cardiac pacemaker: Secondary | ICD-10-CM

## 2022-04-27 DIAGNOSIS — I272 Pulmonary hypertension, unspecified: Secondary | ICD-10-CM

## 2022-04-27 DIAGNOSIS — I10 Essential (primary) hypertension: Secondary | ICD-10-CM

## 2022-04-27 NOTE — Progress Notes (Signed)
Cardiology Office Note:    Date:  04/27/2022   ID:  Lindsey Ross, DOB [birth year], MRN [id number]  PCP:  Lindsey Solian, MD  Cardiologist:  Lindsey Campus, MD    Referring MD: Lindsey Solian, MD   Chief Complaint  Patient presents with   Follow-up    History of Present Illness:    Lindsey Ross is a 88 y.o. female with past medical history significant for pulm emboli, she is anticoagulated Eliquis, essential hypertension, dyslipidemia, pacemaker present.  She is coming to my office for follow-up.  Overall she is doing well she is amazing woman she is 88 years old but looks much younger than her stated age still doing some things with cooking still reading enjoying herself.  Past Medical History:  Diagnosis Date   Acute bilateral low back pain without sciatica 08/31/2016   Acute deep vein thrombosis (DVT) of left tibial vein (HCC) 06/26/2017   Acute pain of right wrist 08/31/2016   Aortic atherosclerosis ([place]) 06/25/2017   Arthritis    "right ankle; maybe some in her back" (07/07/2017)   Bradyarrhythmia 06/23/2017   Burning sensation of feet    Bilateral feet   Closed fracture distal radius and ulna, right, initial encounter 08/31/2016   DVT (deep venous thrombosis) ([place]) 06/24/2017   LLE   Essential hypertension 06/23/2017   Frequent urination    Heart block AV second degree 06/24/2017   Hypertension    Osteoarthritis of right hip 03/25/2015   Pacemaker 01/13/2018   Presence of permanent cardiac pacemaker    Pulmonary embolism ([place]) 06/24/2017   Pulmonary hypertension, unspecified ([place]) 12/28/2017   70 mmHg by echo in October 2018   Shortness of breath dyspnea    with exertion   SOB (shortness of breath) on exertion 06/23/2017   Status post total replacement of right hip 03/25/2015    Past Surgical History:  Procedure Laterality Date   ABDOMINAL HYSTERECTOMY     ANKLE FRACTURE SURGERY Right 2003   CATARACT EXTRACTION W/ INTRAOCULAR LENS  IMPLANT, BILATERAL Bilateral     FRACTURE SURGERY     INSERT / REPLACE / REMOVE PACEMAKER  07/07/2017   JOINT REPLACEMENT     LAPAROSCOPIC CHOLECYSTECTOMY     PACEMAKER IMPLANT N/A 07/07/2017   Procedure: PACEMAKER IMPLANT;  Surgeon: Lindsey Haw, MD;  Location: [place] [facility];  Service: Cardiovascular;  Laterality: N/A;   TOTAL HIP ARTHROPLASTY Right 03/25/2015   Procedure: RIGHT TOTAL HIP ARTHROPLASTY ANTERIOR APPROACH;  Surgeon: Lindsey Rossetti, MD;  Location: [facility];  Service: Orthopedics;  Laterality: Right;    Current Medications: Current Meds  Medication Sig   acetaminophen (TYLENOL) 500 MG tablet Take 1,000 mg by mouth daily.   albuterol (PROVENTIL HFA;VENTOLIN HFA) 108 (90 Base) MCG/ACT inhaler Inhale 1-2 puffs into the lungs every 6 (six) hours as needed for wheezing or shortness of breath.   apixaban (ELIQUIS) 5 MG TABS tablet Take 5 mg by mouth 2 (two) times daily.   Calcium Carb-Cholecalciferol (CALCIUM + D3 PO) Take 1 tablet by mouth daily. Unknown strength   furosemide (LASIX) 40 MG tablet Take 1 tablet (40 mg total) by mouth daily.   gabapentin (NEURONTIN) 100 MG capsule Take 200 mg by mouth 3 (three) times daily.   Multiple Vitamins-Minerals (MULTIVITAMIN WITH MINERALS) tablet Take 1 tablet by mouth daily. Unknown strength   telmisartan-hydrochlorothiazide (MICARDIS HCT) 80-25 MG tablet Take 1 tablet by mouth daily.   [DISCONTINUED] acetaminophen-codeine (TYLENOL #3) 300-30 MG tablet Take 1-2  tablets by mouth every 8 (eight) hours as needed. (Patient taking differently: Take 1-2 tablets by mouth every 8 (eight) hours as needed for moderate pain or severe pain.)   [DISCONTINUED] diclofenac Sodium (VOLTAREN) 1 % GEL Apply 2 g topically 4 (four) times daily.   [DISCONTINUED] pantoprazole (PROTONIX) 40 MG tablet Take 40 mg by mouth daily.     Allergies:   Alendronate sodium   Social History   Socioeconomic History   Marital status: Married    Spouse name: Not on file   Number of  children: Not on file   Years of education: Not on file   Highest education level: Not on file  Occupational History   Not on file  Tobacco Use   Smoking status: Never   Smokeless tobacco: Never  Vaping Use   Vaping Use: Never used  Substance and Sexual Activity   Alcohol use: No   Drug use: No   Sexual activity: Not on file  Other Topics Concern   Not on file  Social History Narrative   Not on file   Social Determinants of Health   Financial Resource Strain: Not on file  Food Insecurity: Not on file  Transportation Needs: Not on file  Physical Activity: Not on file  Stress: Not on file  Social Connections: Not on file     Family History: The patient's family history is negative for Pulmonary embolism. ROS:   Please see the history of present illness.    All 14 point review of systems negative except as described per history of present illness  EKGs/Labs/Other Studies Reviewed:      Recent Labs: 11/12/2021: BUN 37; Creatinine, Ser 1.11; Potassium 4.7; Sodium 138  Recent Lipid Panel No results found for: "CHOL", "TRIG", "HDL", "CHOLHDL", "VLDL", "LDLCALC", "LDLDIRECT"  Physical Exam:    VS:  BP 122/78 (BP Location: Left Arm, Patient Position: Sitting)   Pulse 73   Ht 5\' 3"  (1.6 m)   Wt 162 lb (73.5 kg)   SpO2 98%   BMI 28.70 kg/m     Wt Readings from Last 3 Encounters:  04/27/22 162 lb (73.5 kg)  11/12/21 162 lb (73.5 kg)  05/06/21 162 lb (73.5 kg)     GEN:  Well nourished, well developed in no acute distress HEENT: Normal NECK: No JVD; No carotid bruits LYMPHATICS: No lymphadenopathy CARDIAC: RRR, no murmurs, no rubs, no gallops RESPIRATORY:  Clear to auscultation without rales, wheezing or rhonchi  ABDOMEN: Soft, non-tender, non-distended MUSCULOSKELETAL:  No edema; No deformity  SKIN: Warm and dry LOWER EXTREMITIES: no swelling NEUROLOGIC:  Alert and oriented x 3 PSYCHIATRIC:  Normal affect   ASSESSMENT:    1. Second degree atrioventricular  block   2. Essential hypertension   3. Pulmonary hypertension, unspecified (HCC)   4. Pacemaker    PLAN:    In order of problems listed above:  Pacemaker presence of Medtronic device to get 4.5 years left in the device.  Normal parameters. Essential hypertension blood pressure well controlled continue present management. Pulmonary hypertension stable no shortness of breath overall doing well Dyslipidemia did review her K PN which show me LDL of 103 HDL 83.  Continue present management.  Fatty of statin in somebody her age is questionable.   Medication Adjustments/Labs and Tests Ordered: Current medicines are reviewed at length with the patient today.  Concerns regarding medicines are outlined above.  No orders of the defined types were placed in this encounter.  Medication changes: No orders of  the defined types were placed in this encounter.   Signed, Georgeanna Lea, MD, [facility] 04/27/2022 2:12 PM    [facility]

## 2022-04-27 NOTE — Patient Instructions (Signed)

## 2022-05-05 NOTE — Progress Notes (Signed)
Remote pacemaker transmission.   

## 2022-05-14 ENCOUNTER — Other Ambulatory Visit: Payer: Self-pay

## 2022-05-14 MED ORDER — FUROSEMIDE 40 MG PO TABS: 40.0000 mg | ORAL_TABLET | Freq: Every day | ORAL | 1 refills | Status: DC

## 2022-05-20 ENCOUNTER — Ambulatory Visit (INDEPENDENT_AMBULATORY_CARE_PROVIDER_SITE_OTHER): Payer: Medicare HMO

## 2022-05-20 ENCOUNTER — Ambulatory Visit
Admission: RE | Admit: 2022-05-20 | Discharge: 2022-05-20 | Disposition: A | Discharge status: Home / Self Care | Payer: Medicare HMO | Source: Ambulatory Visit | Attending: Urgent Care | Admitting: Urgent Care

## 2022-05-20 VITALS — BP 122/70 | HR 83 | Temp 97.9°F | Resp 16

## 2022-05-20 DIAGNOSIS — J189 Pneumonia, unspecified organism: Secondary | ICD-10-CM | POA: Diagnosis not present

## 2022-05-20 DIAGNOSIS — K449 Diaphragmatic hernia without obstruction or gangrene: Secondary | ICD-10-CM | POA: Diagnosis not present

## 2022-05-20 MED ORDER — DOXYCYCLINE HYCLATE 100 MG PO CAPS: 100.0000 mg | ORAL_CAPSULE | Freq: Two times a day (BID) | ORAL | 0 refills | Status: AC

## 2022-05-20 MED ORDER — AMOXICILLIN-POT CLAVULANATE 875-125 MG PO TABS: 1.0000 | ORAL_TABLET | Freq: Two times a day (BID) | ORAL | 0 refills | Status: AC

## 2022-05-20 NOTE — Discharge Instructions (Addendum)
Your x-ray shows evidence of a right middle lobe pneumonia. Please start taking the antibiotics twice daily until gone, for 5 days. Please follow-up with your primary care physician to have a repeat chest x-ray in 6 weeks. Please also discuss possible restarting the omeprazole for the known hiatal hernia. Should you develop worsening cough, shortness of breath, fever, please had to the ER.

## 2022-05-20 NOTE — ED Provider Notes (Signed)
[facility]    CSN: [id number] Arrival date & time: 05/20/22  1551      History   Chief Complaint Chief Complaint  Patient presents with   Cough    Haven't been feeling well since Saturday. Cough and congestion, tired, some shortness of breath. - Entered by patient   Fatigue   Shortness of Breath   Headache   Appointment    HPI Lindsey Ross is a 88 y.o. Ross.   Lindsey 88 year old Ross presents today due to concerns of fatigue, shortness of breath, and cough that been present since roughly Sunday of this past week.  Patient does have an extensive cardiovascular history.  Her baseline O2 is around 94%.  She recently saw a cardiologist at the beginning of this month and was told everything was stable.  Patient reports since Sunday, she has been extremely tired and fatigued.  She reports a primarily dry cough, with only intermittent production.  She is having a hard time coughing it up.  She denies orthopnea or edema.  She does sleep in a recliner, but has done this for years.  She reports pain to her distal sternum and also pain to the right side of her ribs.  She denies a fever.  She does have a history of acid reflux, but denies sore throat, dysphagia, or current reflux symptoms. Stopped omeprazole a while back.   Cough Associated symptoms: headaches and shortness of breath   Shortness of Breath Associated symptoms: cough and headaches   Headache Associated symptoms: cough     Past Medical History:  Diagnosis Date   Acute bilateral low back pain without sciatica 08/31/2016   Acute deep vein thrombosis (DVT) of left tibial vein ([place]) 06/26/2017   Acute pain of right wrist 08/31/2016   Aortic atherosclerosis ([place]) 06/25/2017   Arthritis    "right ankle; maybe some in her back" (07/07/2017)   Bradyarrhythmia 06/23/2017   Burning sensation of feet    Bilateral feet   Closed fracture distal radius and ulna, right, initial encounter 08/31/2016   DVT (deep venous  thrombosis) ([place]) 06/24/2017   LLE   Essential hypertension 06/23/2017   Frequent urination    Heart block AV second degree 06/24/2017   Hypertension    Osteoarthritis of right hip 03/25/2015   Pacemaker 01/13/2018   Presence of permanent cardiac pacemaker    Pulmonary embolism ([place]) 06/24/2017   Pulmonary hypertension, unspecified ([place]) 12/28/2017   70 mmHg by echo in October 2018   Shortness of breath dyspnea    with exertion   SOB (shortness of breath) on exertion 06/23/2017   Status post total replacement of right hip 03/25/2015    Patient Active Problem List   Diagnosis Date Noted   Hypertension    Presence of permanent cardiac pacemaker    Frequent urination    Burning sensation of feet    Arthritis    Chronic kidney disease, stage 3a ([place]) 01/24/2020   Dysphagia 01/24/2020   Type 2 diabetes mellitus with peripheral angiopathy ([place]) 07/10/2018   Pacemaker 01/13/2018   Pulmonary hypertension, unspecified ([place]) 12/28/2017   Acute embolism and thrombosis of unspecified deep veins of left lower extremity ([place]) 07/05/2017   Long term (current) use of anticoagulants 07/05/2017   Acute deep vein thrombosis (DVT) of left tibial vein ([place]) 06/26/2017   Aortic atherosclerosis ([place]) 06/25/2017   Pulmonary hypertension ([place]) 06/24/2017   Second degree atrioventricular block 06/24/2017   DVT (deep venous thrombosis) ([place]) 06/24/2017  Shortness of breath 06/23/2017   Bradyarrhythmia 06/23/2017   Backache 06/20/2017   Disequilibrium 06/20/2017   Late effect of fracture of upper extremity 12/24/2016   Injury 12/24/2016   Disorder of musculoskeletal system 11/24/2016   Degeneration of thoracic intervertebral disc 11/24/2016   Acute pain of right wrist 08/31/2016   Acute bilateral low back pain without sciatica 08/31/2016   Closed fracture distal radius and ulna, right, initial encounter 08/31/2016   Encounter for general adult medical examination without abnormal findings 12/08/2015    Osteoarthritis of right hip 03/25/2015   Status post total replacement of right hip 03/25/2015   Right hip pain 11/29/2014   Overactive bladder 09/23/2011   Allergic rhinitis 09/17/2010   Peripheral venous insufficiency 03/31/2010   Age-related osteoporosis without current pathological fracture 09/16/2009   Cyst of pancreas 09/16/2009   Essential hypertension 09/04/2009   Anemia 09/04/2009   Atherosclerosis 09/04/2009   Chronic kidney disease due to hypertension 09/04/2009   Disorder of vertebral column 09/04/2009   Diaphragmatic hernia 09/04/2009   Hyperlipidemia 09/04/2009    Past Surgical History:  Procedure Laterality Date   ABDOMINAL HYSTERECTOMY     ANKLE FRACTURE SURGERY Right 2003   CATARACT EXTRACTION W/ INTRAOCULAR LENS  IMPLANT, BILATERAL Bilateral    FRACTURE SURGERY     INSERT / REPLACE / REMOVE PACEMAKER  07/07/2017   JOINT REPLACEMENT     LAPAROSCOPIC CHOLECYSTECTOMY     PACEMAKER IMPLANT N/A 07/07/2017   Procedure: PACEMAKER IMPLANT;  Surgeon: Constance Haw, MD;  Location: [facility];  Service: Cardiovascular;  Laterality: N/A;   TOTAL HIP ARTHROPLASTY Right 03/25/2015   Procedure: RIGHT TOTAL HIP ARTHROPLASTY ANTERIOR APPROACH;  Surgeon: Mcarthur Rossetti, MD;  Location: [facility];  Service: Orthopedics;  Laterality: Right;    OB History   No obstetric history on file.      Home Medications    Prior to Admission medications   Medication Sig Start Date End Date Taking? Authorizing Provider  amoxicillin-clavulanate (AUGMENTIN) 875-125 MG tablet Take 1 tablet by mouth every 12 (twelve) hours for 5 days. 05/20/22 05/25/22 Yes Ventura Leggitt L, PA  doxycycline (VIBRAMYCIN) 100 MG capsule Take 1 capsule (100 mg total) by mouth 2 (two) times daily for 5 days. 05/20/22 05/25/22 Yes Jalan Fariss L, PA  acetaminophen (TYLENOL) 500 MG tablet Take 1,000 mg by mouth daily.    [provider]  albuterol (PROVENTIL HFA;VENTOLIN HFA) 108 (90 Base)  MCG/ACT inhaler Inhale 1-2 puffs into the lungs every 6 (six) hours as needed for wheezing or shortness of breath.    [provider]  apixaban (ELIQUIS) 5 MG TABS tablet Take 5 mg by mouth 2 (two) times daily.    [provider]  Calcium Carb-Cholecalciferol (CALCIUM + D3 PO) Take 1 tablet by mouth daily. Unknown strength    [provider]  furosemide (LASIX) 40 MG tablet Take 1 tablet (40 mg total) by mouth daily. 05/14/22   Park Liter, MD  gabapentin (NEURONTIN) 100 MG capsule Take 200 mg by mouth 3 (three) times daily.    [provider]  Multiple Vitamins-Minerals (MULTIVITAMIN WITH MINERALS) tablet Take 1 tablet by mouth daily. Unknown strength    [provider]  telmisartan-hydrochlorothiazide (MICARDIS HCT) 80-25 MG tablet Take 1 tablet by mouth daily. 09/26/16   [provider]    Family History Family History  Problem Relation Age of Onset   Pulmonary embolism Neg Hx     Social History Social History  Tobacco Use   Smoking status: Never   Smokeless tobacco: Never  Vaping Use   Vaping Use: Never used  Substance Use Topics   Alcohol use: No   Drug use: No     Allergies   Alendronate sodium   Review of Systems Review of Systems  Respiratory:  Positive for cough and shortness of breath.   Neurological:  Positive for headaches.  As per HPI   Physical Exam Triage Vital Signs ED Triage Vitals  Enc Vitals Group     BP 05/20/22 1630 122/70     Pulse Rate 05/20/22 1630 83     Resp 05/20/22 1630 16     Temp 05/20/22 1630 97.9 F (36.6 C)     Temp Source 05/20/22 1630 Oral     SpO2 05/20/22 1630 93 %     Weight --      Height --      Head Circumference --      Peak Flow --      Pain Score 05/20/22 1631 5     Pain Loc --      Pain Edu? --      Excl. in [place]? --    No data found.  Updated Vital Signs BP 122/70 (BP Location: Left Arm)   Pulse 83   Temp 97.9 F (36.6 C) (Oral)   Resp 16   SpO2  93%   Visual Acuity Right Eye Distance:   Left Eye Distance:   Bilateral Distance:    Right Eye Near:   Left Eye Near:    Bilateral Near:     Physical Exam Vitals and nursing note reviewed. Exam conducted with a chaperone present.  Constitutional:      General: She is not in acute distress.    Appearance: Normal appearance. She is well-developed. She is not ill-appearing, toxic-appearing or diaphoretic.  HENT:     Head: Normocephalic and atraumatic.     Right Ear: Tympanic membrane, ear canal and external ear normal. There is no impacted cerumen.     Left Ear: Tympanic membrane, ear canal and external ear normal. There is no impacted cerumen.     Nose: Nose normal. No congestion or rhinorrhea.     Comments: dry    Mouth/Throat:     Mouth: Mucous membranes are moist.     Pharynx: Oropharynx is clear. No oropharyngeal exudate or posterior oropharyngeal erythema.  Eyes:     Extraocular Movements: Extraocular movements intact.     Conjunctiva/sclera: Conjunctivae normal.     Pupils: Pupils are equal, round, and reactive to light.  Cardiovascular:     Rate and Rhythm: Normal rate and regular rhythm.     Heart sounds: No murmur heard. Pulmonary:     Effort: Pulmonary effort is normal. No respiratory distress.     Breath sounds: Rhonchi present.     Comments: Fine crackles to bilateral bases Chest:     Chest wall: Tenderness (reproducible pain to distal sternum and R lateral ribs) present.  Abdominal:     Palpations: Abdomen is soft.     Tenderness: There is no abdominal tenderness.  Musculoskeletal:        General: No swelling.     Cervical back: Normal range of motion and neck supple. No rigidity or tenderness.     Right lower leg: No edema.     Left lower leg: No edema.  Lymphadenopathy:     Cervical: No cervical adenopathy.  Skin:    General: Skin  is warm and dry.     Findings: No erythema or rash.  Neurological:     General: No focal deficit present.     Mental  Status: She is alert and oriented to person, place, and time.  Psychiatric:        Mood and Affect: Mood normal.      UC Treatments / Results  Labs (all labs ordered are listed, but only abnormal results are displayed) Labs Reviewed - No data to display  EKG   Radiology DG Chest 2 View  Result Date: 05/20/2022 CLINICAL DATA:  Shortness of breath, cough EXAM: CHEST - 2 VIEW COMPARISON:  07/08/2017 FINDINGS: Large hiatal hernia. Left a ice crash that left pacer remains in place, unchanged. Heart is normal size. Patchy right mid lung airspace opacity. No confluent opacity on the left. No effusions. Compression fractures in the lower thoracic and upper lumbar spine, stable since CT from 2018. IMPRESSION: Patchy airspace opacity in the right mid lung could reflect early infiltrate/pneumonia. Large hiatal hernia. Electronically Signed   By: Rolm Baptise M.D.   On: 05/20/2022 17:04    Procedures Procedures (including critical care time)  Medications Ordered in UC Medications - No data to display  Initial Impression / Assessment and Plan / UC Course  I have reviewed the triage vital signs and the nursing notes.  Pertinent labs & imaging results that were available during my care of the patient were reviewed by me and considered in my medical decision making (see chart for details).     R middle lobe pneumonia -suspect aspiration.  We will start dual ABX therapy with Doxy and Augmentin x5 days.  Encourage patient not to use cough suppressants but rather Mucinex.  Patient to follow-up with her PCP in 6 weeks for repeat chest x-ray.  Hiatal hernia -noted on x-ray.  Question if this is the cause of her intermittent sternal/epigastric pain.  Patient to discuss restarting omeprazole for her GERD with PCP upon follow-up.   Final Clinical Impressions(s) / UC Diagnoses   Final diagnoses:  Pneumonia of right middle lobe due to infectious organism  Hiatal hernia     Discharge Instructions       Your x-ray shows evidence of a right middle lobe pneumonia. Please start taking the antibiotics twice daily until gone, for 5 days. Please follow-up with your primary care physician to have a repeat chest x-ray in 6 weeks. Please also discuss possible restarting the omeprazole for the known hiatal hernia. Should you develop worsening cough, shortness of breath, fever, please had to the ER.    ED Prescriptions     Medication Sig Dispense Auth. Provider   doxycycline (VIBRAMYCIN) 100 MG capsule Take 1 capsule (100 mg total) by mouth 2 (two) times daily for 5 days. 10 capsule Andron Marrazzo L, PA   amoxicillin-clavulanate (AUGMENTIN) 875-125 MG tablet Take 1 tablet by mouth every 12 (twelve) hours for 5 days. 10 tablet Simya Tercero L, [place]      PDMP not reviewed this encounter.   Chaney Malling, [place] 05/20/22 1742

## 2022-05-20 NOTE — ED Triage Notes (Signed)
Patient presents to UC for SOB, HA, fatigue, cough, nasal congestion since Saturday. Taking tylenol.

## 2022-07-08 ENCOUNTER — Ambulatory Visit (INDEPENDENT_AMBULATORY_CARE_PROVIDER_SITE_OTHER): Payer: Medicare HMO

## 2022-07-08 DIAGNOSIS — I441 Atrioventricular block, second degree: Secondary | ICD-10-CM | POA: Diagnosis not present

## 2022-07-08 LAB — CUP PACEART REMOTE DEVICE CHECK
Battery Remaining Longevity: 63 mo
Battery Voltage: 2.9 V
Brady Statistic AP VP Percent: 27.7 %
Brady Statistic AP VS Percent: 0 %
Brady Statistic AS VP Percent: 72.27 %
Brady Statistic AS VS Percent: 0.02 %
Brady Statistic RA Percent Paced: 26.88 %
Brady Statistic RV Percent Paced: 99.98 %
Date Time Interrogation Session: 20231115223236
Implantable Lead Connection Status: 753985
Implantable Lead Connection Status: 753985
Implantable Lead Implant Date: 20181115
Implantable Lead Implant Date: 20181115
Implantable Lead Location: 753859
Implantable Lead Location: 753860
Implantable Lead Model: 5076
Implantable Lead Model: 5076
Implantable Pulse Generator Implant Date: 20181115
Lead Channel Impedance Value: 304 Ohm
Lead Channel Impedance Value: 418 Ohm
Lead Channel Impedance Value: 494 Ohm
Lead Channel Impedance Value: 513 Ohm
Lead Channel Pacing Threshold Amplitude: 1.125 V
Lead Channel Pacing Threshold Amplitude: 1.125 V
Lead Channel Pacing Threshold Pulse Width: 0.4 ms
Lead Channel Pacing Threshold Pulse Width: 0.4 ms
Lead Channel Sensing Intrinsic Amplitude: 0.5 mV
Lead Channel Sensing Intrinsic Amplitude: 0.5 mV
Lead Channel Sensing Intrinsic Amplitude: 9.625 mV
Lead Channel Sensing Intrinsic Amplitude: 9.625 mV
Lead Channel Setting Pacing Amplitude: 2.25 V
Lead Channel Setting Pacing Amplitude: 2.75 V
Lead Channel Setting Pacing Pulse Width: 0.4 ms
Lead Channel Setting Sensing Sensitivity: 1.2 mV
Zone Setting Status: 755011
Zone Setting Status: 755011

## 2022-07-28 NOTE — Progress Notes (Signed)
Remote pacemaker transmission.   

## 2022-08-24 ENCOUNTER — Telehealth: Payer: Self-pay | Admitting: Cardiology

## 2022-08-24 NOTE — Telephone Encounter (Signed)
Pt c/o Shortness Of Breath: STAT if SOB developed within the last 24 hours or pt is noticeably SOB on the phone  1. Are you currently SOB (can you hear that pt is SOB on the phone)?  Not currently with the patient   2. How long have you been experiencing SOB?  Increased SOB over the past few weeks  3. Are you SOB when sitting or when up moving around?  Primarily when up and moving around  4. Are you currently experiencing any other symptoms? Dry throat/cough

## 2022-08-24 NOTE — Telephone Encounter (Signed)
Spoke with Lindsey Ross- Advised Ross Dr. Agustin Cree that due to her age and fragility that she should go to the ER for evaluation.

## 2022-08-25 ENCOUNTER — Encounter (HOSPITAL_BASED_OUTPATIENT_CLINIC_OR_DEPARTMENT_OTHER): Payer: Self-pay

## 2022-08-25 ENCOUNTER — Emergency Department (HOSPITAL_BASED_OUTPATIENT_CLINIC_OR_DEPARTMENT_OTHER): Payer: Medicare HMO | Admitting: Radiology

## 2022-08-25 ENCOUNTER — Other Ambulatory Visit: Payer: Self-pay

## 2022-08-25 ENCOUNTER — Emergency Department (HOSPITAL_BASED_OUTPATIENT_CLINIC_OR_DEPARTMENT_OTHER)
Admission: EM | Admit: 2022-08-25 | Discharge: 2022-08-25 | Disposition: A | Discharge status: Home / Self Care | Payer: Medicare HMO | Attending: Emergency Medicine | Admitting: Emergency Medicine

## 2022-08-25 ENCOUNTER — Emergency Department (HOSPITAL_BASED_OUTPATIENT_CLINIC_OR_DEPARTMENT_OTHER): Payer: Medicare HMO

## 2022-08-25 DIAGNOSIS — J40 Bronchitis, not specified as acute or chronic: Secondary | ICD-10-CM | POA: Insufficient documentation

## 2022-08-25 DIAGNOSIS — J8410 Pulmonary fibrosis, unspecified: Secondary | ICD-10-CM | POA: Diagnosis not present

## 2022-08-25 DIAGNOSIS — Z20822 Contact with and (suspected) exposure to covid-19: Secondary | ICD-10-CM | POA: Insufficient documentation

## 2022-08-25 DIAGNOSIS — Z7901 Long term (current) use of anticoagulants: Secondary | ICD-10-CM | POA: Insufficient documentation

## 2022-08-25 DIAGNOSIS — R0602 Shortness of breath: Secondary | ICD-10-CM | POA: Diagnosis present

## 2022-08-25 LAB — TROPONIN I (HIGH SENSITIVITY)
Troponin I (High Sensitivity): 58 ng/L — ABNORMAL HIGH (ref <18)
Troponin I (High Sensitivity): 55 ng/L — ABNORMAL HIGH (ref <18)

## 2022-08-25 LAB — BASIC METABOLIC PANEL
Anion gap: 12 (ref 5–15)
BUN: 38 mg/dL — ABNORMAL HIGH (ref 8–23)
CO2: 31 mmol/L (ref 22–32)
Calcium: 10.7 mg/dL — ABNORMAL HIGH (ref 8.9–10.3)
Chloride: 93 mmol/L — ABNORMAL LOW (ref 98–111)
Creatinine, Ser: 1.08 mg/dL — ABNORMAL HIGH (ref 0.44–1.00)
GFR, Estimated: 47 mL/min — ABNORMAL LOW (ref >60)
Glucose, Bld: 278 mg/dL — ABNORMAL HIGH (ref 70–99)
Potassium: 4.5 mmol/L (ref 3.5–5.1)
Sodium: 136 mmol/L (ref 135–145)

## 2022-08-25 LAB — CBC
HCT: 39 % (ref 36.0–46.0)
Hemoglobin: 11.7 g/dL — ABNORMAL LOW (ref 12.0–15.0)
MCH: 24.8 pg — ABNORMAL LOW (ref 26.0–34.0)
MCHC: 30 g/dL (ref 30.0–36.0)
MCV: 82.6 fL (ref 80.0–100.0)
Platelets: 279 10*3/uL (ref 150–400)
RBC: 4.72 MIL/uL (ref 3.87–5.11)
RDW: 15.1 % (ref 11.5–15.5)
WBC: 8.2 10*3/uL (ref 4.0–10.5)
nRBC: 0 % (ref 0.0–0.2)

## 2022-08-25 LAB — BRAIN NATRIURETIC PEPTIDE: B Natriuretic Peptide: 146.7 pg/mL — ABNORMAL HIGH (ref 0.0–100.0)

## 2022-08-25 LAB — RESP PANEL BY RT-PCR (RSV, FLU A&B, COVID)  RVPGX2
Influenza A by PCR: NEGATIVE
Influenza B by PCR: NEGATIVE
Resp Syncytial Virus by PCR: NEGATIVE
SARS Coronavirus 2 by RT PCR: NEGATIVE

## 2022-08-25 MED ORDER — DOXYCYCLINE HYCLATE 100 MG PO CAPS: 100.0000 mg | ORAL_CAPSULE | Freq: Two times a day (BID) | ORAL | 0 refills | Status: DC

## 2022-08-25 MED ORDER — DOXYCYCLINE HYCLATE 100 MG PO TABS
100.0000 mg | ORAL_TABLET | Freq: Once | ORAL | Status: AC
  Administered 2022-08-25: 100 mg via ORAL
  Filled 2022-08-25: qty 1

## 2022-08-25 MED ORDER — ALBUTEROL SULFATE HFA 108 (90 BASE) MCG/ACT IN AERS
2.0000 | INHALATION_SPRAY | Freq: Once | RESPIRATORY_TRACT | Status: AC
  Administered 2022-08-25: 2 via RESPIRATORY_TRACT
  Filled 2022-08-25: qty 6.7

## 2022-08-25 MED ORDER — IPRATROPIUM-ALBUTEROL 0.5-2.5 (3) MG/3ML IN SOLN
3.0000 mL | Freq: Once | RESPIRATORY_TRACT | Status: AC
  Administered 2022-08-25: 3 mL via RESPIRATORY_TRACT
  Filled 2022-08-25: qty 3

## 2022-08-25 MED ORDER — AEROCHAMBER PLUS FLO-VU MEDIUM MISC
1.0000 | Freq: Once | Status: AC
  Administered 2022-08-25: 1
  Filled 2022-08-25: qty 1

## 2022-08-25 NOTE — ED Provider Notes (Signed)
[facility] EMERGENCY DEPT Provider Note   CSN: [id number] Arrival date & time: 08/25/22  1119     History  Chief Complaint  Patient presents with   Shortness of Breath    Lindsey Ross is a 89 y.o. female.  HPI At baseline patient does have shortness of breath.  However it has become significantly worse over the past several weeks and then again much worse over the past few days.  Patient reports now with minimal exertion in her home she has to rest and catch her breath.  Patient denies she is getting chest pain.  No fevers or productive cough.  No swelling of the legs or pain in the calves.  Patient does have history of DVT and PE.  She is compliant with her Eliquis.  She is not experiencing any pleuritic type chest pain.  No syncope.  EMR documents pulmonary hypertension.  Patient and her daughter report that there was no specific referrals or monitoring for this.  It was reportedly diagnosed by her cardiologist but she denies having been pacifically treated that she is aware of.  Patient does have inhalers to use but uses them if needed.  She reports that usually when she rest the shortness of breath starts to abate so she does not typically take an inhaler in that circumstance.  She has not tried it as a preventative method for shortness of breath.  Patient reports that she is compliant with her daily Lasix although she did not take it today because she was coming to the hospital and did not want to have to get up multiple times to go to the bathroom.    Home Medications Prior to Admission medications   Medication Sig Start Date End Date Taking? Authorizing Provider  acetaminophen (TYLENOL) 500 MG tablet Take 1,000 mg by mouth daily.    [provider]  albuterol (PROVENTIL HFA;VENTOLIN HFA) 108 (90 Base) MCG/ACT inhaler Inhale 1-2 puffs into the lungs every 6 (six) hours as needed for wheezing or shortness of breath.    [provider]  apixaban  (ELIQUIS) 5 MG TABS tablet Take 5 mg by mouth 2 (two) times daily.    [provider]  Calcium Carb-Cholecalciferol (CALCIUM + D3 PO) Take 1 tablet by mouth daily. Unknown strength    [provider]  furosemide (LASIX) 40 MG tablet Take 1 tablet (40 mg total) by mouth daily. 05/14/22   Georgeanna Lea, MD  gabapentin (NEURONTIN) 100 MG capsule Take 200 mg by mouth 3 (three) times daily.    [provider]  Multiple Vitamins-Minerals (MULTIVITAMIN WITH MINERALS) tablet Take 1 tablet by mouth daily. Unknown strength    [provider]  telmisartan-hydrochlorothiazide (MICARDIS HCT) 80-25 MG tablet Take 1 tablet by mouth daily. 09/26/16   [provider]      Allergies    Alendronate sodium    Review of Systems   Review of Systems  Physical Exam Updated Vital Signs BP (!) 156/144   Pulse 73   Temp 98.1 F (36.7 C) (Oral)   Resp 19   Ht 5\' 3"  (1.6 m)   Wt 73.5 kg   SpO2 95%   BMI 28.70 kg/m  Physical Exam Constitutional:      Comments: Alert nontoxic clear mental status.  HENT:     Head: Normocephalic and atraumatic.     Mouth/Throat:     Pharynx: Oropharynx is clear.  Eyes:     Extraocular Movements: Extraocular movements intact.  Cardiovascular:     Rate and Rhythm: Normal rate and regular rhythm.     Comments: Split S1.  No gallop. Pulmonary:     Comments: Respiratory distress at rest.  Crackles bilateral lung fields to posterior auscultation.  No wheeze.  Breath sounds slightly soft at the bases. Abdominal:     General: There is no distension.     Palpations: Abdomen is soft.     Tenderness: There is no abdominal tenderness. There is no guarding.  Musculoskeletal:        General: No swelling or tenderness. Normal range of motion.     Right lower leg: No edema.     Left lower leg: No edema.  Skin:    General: Skin is warm and dry.  Neurological:     General: No focal deficit present.     Mental Status: She is oriented  to person, place, and time.     Coordination: Coordination normal.  Psychiatric:        Mood and Affect: Mood normal.     ED Results / Procedures / Treatments   Labs (all labs ordered are listed, but only abnormal results are displayed) Labs Reviewed  BASIC METABOLIC PANEL - Abnormal; Notable for the following components:      Result Value   Chloride 93 (*)    Glucose, Bld 278 (*)    BUN 38 (*)    Creatinine, Ser 1.08 (*)    Calcium 10.7 (*)    GFR, Estimated 47 (*)    All other components within normal limits  CBC - Abnormal; Notable for the following components:   Hemoglobin 11.7 (*)    MCH 24.8 (*)    All other components within normal limits  TROPONIN I (HIGH SENSITIVITY) - Abnormal; Notable for the following components:   Troponin I (High Sensitivity) 58 (*)    All other components within normal limits  TROPONIN I (HIGH SENSITIVITY) - Abnormal; Notable for the following components:   Troponin I (High Sensitivity) 55 (*)    All other components within normal limits  RESP PANEL BY RT-PCR (RSV, FLU A&B, COVID)  RVPGX2  BRAIN NATRIURETIC PEPTIDE    EKG EKG Interpretation  Date/Time:  Wednesday August 25 2022 11:44:31 EST Ventricular Rate:  76 PR Interval:  210 QRS Duration: 170 QT Interval:  438 QTC Calculation: 492 R Axis:   -78 Text Interpretation: Atrial-sensed ventricular-paced rhythm with prolonged AV conduction Abnormal ECG When compared with ECG of 08-Jul-2017 05:03, Vent. rate has increased BY   6 BPM no change from previous Confirmed by Charlesetta Shanks [phone]) on 08/25/2022 4:56:54 PM  Radiology DG Chest 2 View  Result Date: 08/25/2022 CLINICAL DATA:  Shortness of breath EXAM: CHEST - 2 VIEW COMPARISON:  05/20/2022 FINDINGS: Left chest cardiac device with leads in the right atrium and ventricle. Unchanged cardiac and mediastinal contours. Low lung volumes. Large hiatal hernia with associated atelectasis. No new focal pulmonary opacity. No definite pleural  effusion. No pneumothorax. Redemonstrated compression deformities in the lower thoracic and upper lumbar spine. No acute osseous abnormality. IMPRESSION: Large hiatal hernia with associated atelectasis. No new focal pulmonary opacity. Electronically Signed   By: Merilyn Baba M.D.   On: 08/25/2022 12:05    Procedures Procedures    Medications Ordered in ED Medications  ipratropium-albuterol (DUONEB) 0.5-2.5 (3) MG/3ML nebulizer solution 3 mL (has no administration in time range)    ED Course/ Medical Decision Making/ A&P  Medical Decision Making Amount and/or Complexity of Data Reviewed Labs: ordered. Radiology: ordered.  Risk Prescription drug management.   Patient presents with medical history significant for pulm emboli, she is anticoagulated Eliquis, essential hypertension, dyslipidemia, pacemaker and pulmonary hypertension documented as stable by cardiology note from Dr. Agustin Cree [id number].  Patient has multiple potential etiologies for incrementally increasing dyspnea.  She describes this as gradual in onset for least a month 2 weeks of worsening from baseline.  Reportedly the past several days have been specifically worse.  At this time we will proceed to evaluate for congestive heart failure\ACS\pneumonia\pulmonary hypertension\viral pneumonia.  At this time I have lower suspicion for pulmonary embolus.  Patient has been compliant with her Eliquis.  Symptoms have been very incremental in nature and no chest pain associated.  Patient also does not have any increased swelling or pain of her lower extremities.  At this time, I have reviewed two-view chest x-ray and there appears to be some diffuse coarseness possibly vascular congestion versus fibrosis.  I will also proceed with a noncontrast CT scan to further clarify any underlying pneumonia process versus volume overload versus fibrotic appearance.  2 view chest x-ray visually reviewed by myself as well as  radiology interpretation radiology identifies pacing device and otherwise unchanged cardiac and mediastinal silhouette.  Large hiatal hernia but no focal consolidations or radiology interpretation.  Trop 58, repeat 55, BNP 146.  Patient is clinically stable.  Multiple potentially etiologies for dyspnea.  At this time after diagnostic evaluation, suspect symptoms are most likely a combination of lying pulmonary fibrosis and probable acute bronchitis.  Clear the patient is well in appearance and has stable vital signs.  Maxie Better will be for continued home management with a course of doxycycline and use of an albuterol inhaler for wheezing.  Patient will take an extra 20 mg dose of her Lasix for the next 3 days for internal light diuresis that she feels that she has some increase in swelling.  I will be for close follow-up with PCP and return precautions to the emergency department reviewed.        Final Clinical Impression(s) / ED Diagnoses Final diagnoses:  Bronchitis  Pulmonary fibrosis ([place])    Rx / DC Orders ED Discharge Orders     None         Charlesetta Shanks, MD 09/01/22 1859

## 2022-08-25 NOTE — Discharge Instructions (Signed)
1.  Take doxycycline twice daily as prescribed.  Use the inhaler 2 puffs every 4-6 hours as needed for shortness of breath or wheezing. 2.  Take an extra 20 mg of Lasix for the next 3 days.  You may take this around noon. 3.  Follow-up with your family doctor soon as possible. 4.  Return to the emergency department if you have new or worsening symptoms.

## 2022-08-25 NOTE — ED Provider Triage Note (Signed)
Emergency Medicine Provider Triage Evaluation Note  Lindsey Ross , a 89 y.o. female  was evaluated in triage.  Pt complains of shortness of breath onset 1 month. Has had a cough as well. Notes chest pain for the past several days. Denies history of asthma, COPD, CHF.   Review of Systems  Positive:  Negative:   Physical Exam  BP (!) 151/83 (BP Location: Right Arm)   Pulse 78   Temp 98.1 F (36.7 C)   Resp (!) 22   Ht 5\' 3"  (1.6 m)   Wt 73.5 kg   SpO2 94%   BMI 28.70 kg/m  Gen:   Awake, no distress   Resp:  Normal effort  MSK:   Moves extremities without difficulty  Other:    Medical Decision Making  Medically screening exam initiated at 11:49 AM.  Appropriate orders placed.  [name] was informed that the remainder of the evaluation will be completed by another provider, this initial triage assessment does not replace that evaluation, and the importance of remaining in the ED until their evaluation is complete.     Tyrick Dunagan A, PA-C 08/25/22 1150

## 2022-08-25 NOTE — ED Triage Notes (Signed)
Patient here POV from Home.  Endorses SOB that began approximately 1 Month ago but worsened over the past few days. Some Cough. No fevers. Some Mid Chest Pain as well over the past few days.  NAD Noted during Triage. A&OX4. GCS 15. Ambulatory with Cane.

## 2022-10-07 ENCOUNTER — Ambulatory Visit (INDEPENDENT_AMBULATORY_CARE_PROVIDER_SITE_OTHER): Payer: Medicare HMO

## 2022-10-07 DIAGNOSIS — I441 Atrioventricular block, second degree: Secondary | ICD-10-CM

## 2022-10-07 LAB — CUP PACEART REMOTE DEVICE CHECK
Battery Remaining Longevity: 61 mo
Battery Voltage: 2.9 V
Brady Statistic AP VP Percent: 18.58 %
Brady Statistic AP VS Percent: 0 %
Brady Statistic AS VP Percent: 81.39 %
Brady Statistic AS VS Percent: 0.02 %
Brady Statistic RA Percent Paced: 18.26 %
Brady Statistic RV Percent Paced: 99.98 %
Date Time Interrogation Session: 20240215021655
Implantable Lead Connection Status: 753985
Implantable Lead Connection Status: 753985
Implantable Lead Implant Date: 20181115
Implantable Lead Implant Date: 20181115
Implantable Lead Location: 753859
Implantable Lead Location: 753860
Implantable Lead Model: 5076
Implantable Lead Model: 5076
Implantable Pulse Generator Implant Date: 20181115
Lead Channel Impedance Value: 323 Ohm
Lead Channel Impedance Value: 437 Ohm
Lead Channel Impedance Value: 494 Ohm
Lead Channel Impedance Value: 513 Ohm
Lead Channel Pacing Threshold Amplitude: 0.875 V
Lead Channel Pacing Threshold Amplitude: 1 V
Lead Channel Pacing Threshold Pulse Width: 0.4 ms
Lead Channel Pacing Threshold Pulse Width: 0.4 ms
Lead Channel Sensing Intrinsic Amplitude: 0.625 mV
Lead Channel Sensing Intrinsic Amplitude: 0.625 mV
Lead Channel Sensing Intrinsic Amplitude: 9.625 mV
Lead Channel Sensing Intrinsic Amplitude: 9.625 mV
Lead Channel Setting Pacing Amplitude: 2 V
Lead Channel Setting Pacing Amplitude: 2.5 V
Lead Channel Setting Pacing Pulse Width: 0.4 ms
Lead Channel Setting Sensing Sensitivity: 1.2 mV
Zone Setting Status: 755011
Zone Setting Status: 755011

## 2022-11-02 ENCOUNTER — Encounter: Payer: Self-pay | Admitting: Cardiology

## 2022-11-02 ENCOUNTER — Ambulatory Visit: Payer: Medicare HMO | Attending: Cardiology | Admitting: Cardiology

## 2022-11-02 VITALS — BP 182/82 | HR 84 | Ht 63.0 in | Wt 155.0 lb

## 2022-11-02 DIAGNOSIS — I272 Pulmonary hypertension, unspecified: Secondary | ICD-10-CM | POA: Diagnosis not present

## 2022-11-02 DIAGNOSIS — I10 Essential (primary) hypertension: Secondary | ICD-10-CM

## 2022-11-02 DIAGNOSIS — E1151 Type 2 diabetes mellitus with diabetic peripheral angiopathy without gangrene: Secondary | ICD-10-CM

## 2022-11-02 DIAGNOSIS — Z950 Presence of cardiac pacemaker: Secondary | ICD-10-CM | POA: Diagnosis not present

## 2022-11-02 DIAGNOSIS — R0602 Shortness of breath: Secondary | ICD-10-CM

## 2022-11-02 MED ORDER — TELMISARTAN-HCTZ 80-25 MG PO TABS: 0.5000 | ORAL_TABLET | Freq: Every day | ORAL | 3 refills | Status: DC

## 2022-11-02 NOTE — Addendum Note (Signed)
Addended by: Jacobo Forest D on: 11/02/2022 01:46 PM   Modules accepted: Orders

## 2022-11-02 NOTE — Progress Notes (Signed)
Cardiology Office Note:    Date:  11/02/2022   ID:  [name], DOB Dec 22, 1924, MRN [id number]  PCP:  Lindsey Solian, MD  Cardiologist:  Lindsey Campus, MD    Referring MD: Lindsey Solian, MD   No chief complaint on file. Had some back problem  History of Present Illness:    Lindsey Ross is a 89 y.o. female past medical history significant for pulm emboli, she is anticoag with Eliquis, essential hypertension, dyslipidemia, pacemaker presents secondary to second-degree AV block, comes today to my office for follow-up.  She did have some bronchitis then she end up having some chronic back problem her blood pressure seems to be elevated previously was low that is why Micardis hydrochlorothiazide has been discontinued denies have any chest pain tightness squeezing pressure burning chest no cardiac complaints  Past Medical History:  Diagnosis Date   Acute bilateral low back pain without sciatica 08/31/2016   Acute deep vein thrombosis (DVT) of left tibial vein ([place]) 06/26/2017   Acute pain of right wrist 08/31/2016   Aortic atherosclerosis ([place]) 06/25/2017   Arthritis    "right ankle; maybe some in her back" (07/07/2017)   Bradyarrhythmia 06/23/2017   Burning sensation of feet    Bilateral feet   Closed fracture distal radius and ulna, right, initial encounter 08/31/2016   DVT (deep venous thrombosis) ([place]) 06/24/2017   LLE   Essential hypertension 06/23/2017   Frequent urination    Heart block AV second degree 06/24/2017   Hypertension    Osteoarthritis of right hip 03/25/2015   Pacemaker 01/13/2018   Presence of permanent cardiac pacemaker    Pulmonary embolism ([place]) 06/24/2017   Pulmonary hypertension, unspecified ([place]) 12/28/2017   70 mmHg by echo in October 2018   Shortness of breath dyspnea    with exertion   SOB (shortness of breath) on exertion 06/23/2017   Status post total replacement of right hip 03/25/2015    Past Surgical History:  Procedure Laterality Date    ABDOMINAL HYSTERECTOMY     ANKLE FRACTURE SURGERY Right 2003   CATARACT EXTRACTION W/ INTRAOCULAR LENS  IMPLANT, BILATERAL Bilateral    FRACTURE SURGERY     INSERT / REPLACE / REMOVE PACEMAKER  07/07/2017   JOINT REPLACEMENT     LAPAROSCOPIC CHOLECYSTECTOMY     PACEMAKER IMPLANT N/A 07/07/2017   Procedure: PACEMAKER IMPLANT;  Surgeon: Constance Haw, MD;  Location: [facility];  Service: Cardiovascular;  Laterality: N/A;   TOTAL HIP ARTHROPLASTY Right 03/25/2015   Procedure: RIGHT TOTAL HIP ARTHROPLASTY ANTERIOR APPROACH;  Surgeon: Mcarthur Rossetti, MD;  Location: [facility];  Service: Orthopedics;  Laterality: Right;    Current Medications: Current Meds  Medication Sig   acetaminophen (TYLENOL) 500 MG tablet Take 1,000 mg by mouth daily.   albuterol (PROVENTIL HFA;VENTOLIN HFA) 108 (90 Base) MCG/ACT inhaler Inhale 1-2 puffs into the lungs every 6 (six) hours as needed for wheezing or shortness of breath.   apixaban (ELIQUIS) 5 MG TABS tablet Take 5 mg by mouth 2 (two) times daily.   Calcium Carb-Cholecalciferol (CALCIUM + D3 PO) Take 1 tablet by mouth daily. Unknown strength   furosemide (LASIX) 40 MG tablet Take 1 tablet (40 mg total) by mouth daily.   gabapentin (NEURONTIN) 100 MG capsule Take 200 mg by mouth 3 (three) times daily.   Multiple Vitamins-Minerals (MULTIVITAMIN WITH MINERALS) tablet Take 1 tablet by mouth daily. Unknown strength     Allergies:   Alendronate sodium  Social History   Socioeconomic History   Marital status: Widowed    Spouse name: Not on file   Number of children: Not on file   Years of education: Not on file   Highest education level: Not on file  Occupational History   Not on file  Tobacco Use   Smoking status: Never   Smokeless tobacco: Never  Vaping Use   Vaping Use: Never used  Substance and Sexual Activity   Alcohol use: No   Drug use: No   Sexual activity: Not on file  Other Topics Concern   Not on file  Social History  Narrative   Not on file   Social Determinants of Health   Financial Resource Strain: Not on file  Food Insecurity: Not on file  Transportation Needs: Not on file  Physical Activity: Not on file  Stress: Not on file  Social Connections: Not on file     Family History: The patient's family history is negative for Pulmonary embolism. ROS:   Please see the history of present illness.    All 14 point review of systems negative except as described per history of present illness  EKGs/Labs/Other Studies Reviewed:      Recent Labs: 08/25/2022: B Natriuretic Peptide 146.7; BUN 38; Creatinine, Ser 1.08; Hemoglobin 11.7; Platelets 279; Potassium 4.5; Sodium 136  Recent Lipid Panel No results found for: "CHOL", "TRIG", "HDL", "CHOLHDL", "VLDL", "LDLCALC", "LDLDIRECT"  Physical Exam:    VS:  BP (!) 182/82 (BP Location: Left Arm, Patient Position: Sitting)   Pulse 84   Ht '5\' 3"'$  (1.6 m)   Wt 155 lb (70.3 kg)   SpO2 97%   BMI 27.46 kg/m     Wt Readings from Last 3 Encounters:  11/02/22 155 lb (70.3 kg)  08/25/22 162 lb 0.6 oz (73.5 kg)  04/27/22 162 lb (73.5 kg)     GEN:  Well nourished, well developed in no acute distress HEENT: Normal NECK: No JVD; No carotid bruits LYMPHATICS: No lymphadenopathy CARDIAC: RRR, no murmurs, no rubs, no gallops RESPIRATORY:  Clear to auscultation without rales, wheezing or rhonchi  ABDOMEN: Soft, non-tender, non-distended MUSCULOSKELETAL:  No edema; No deformity  SKIN: Warm and dry LOWER EXTREMITIES: no swelling NEUROLOGIC:  Alert and oriented x 3 PSYCHIATRIC:  Normal affect   ASSESSMENT:    1. Essential hypertension   2. Pulmonary hypertension, unspecified ([place])   3. Type 2 diabetes mellitus with peripheral angiopathy ([place])   4. Presence of permanent cardiac pacemaker   5. Shortness of breath    PLAN:    In order of problems listed above:  Essential hypertension which seems to be uncontrolled.  I will put her back on Micardis  hydrochlorothiazide however ask her to take half a tablet every single day see her blood pressure response. Pulmonary hypertension due to chronic problem stable. Type 2 diabetes that being followed by internal medicine team, last hemoglobin A1c I have is from September 29, 2022 which was 8.3 obviously too high but she was sick at the time Pacemaker present last interrogation in February, normal function, 5.1 years left   Medication Adjustments/Labs and Tests Ordered: Current medicines are reviewed at length with the patient today.  Concerns regarding medicines are outlined above.  No orders of the defined types were placed in this encounter.  Medication changes: No orders of the defined types were placed in this encounter.   Signed, Lindsey Liter, MD, [facility] 11/02/2022 1:40 PM    [facility]  [facility]

## 2022-11-02 NOTE — Patient Instructions (Addendum)
Medication Instructions:   DECREASE: Miacardis/HCTZ 80-25 to  1/2 tablet daily   Lab Work: None Ordered If you have labs (blood work) drawn today and your tests are completely normal, you will receive your results only by: MyChart Message (if you have MyChart) OR A paper copy in the mail If you have any lab test that is abnormal or we need to change your treatment, we will call you to review the results.   Testing/Procedures: None Ordered   Follow-Up: At [facility], you and your health needs are our priority.  As part of our continuing mission to provide you with exceptional heart care, we have created designated Provider Care Teams.  These Care Teams include your primary Cardiologist (physician) and Advanced Practice Providers (APPs -  Physician Assistants and Nurse Practitioners) who all work together to provide you with the care you need, when you need it.  We recommend signing up for the patient portal called "MyChart".  Sign up information is provided on this After Visit Summary.  MyChart is used to connect with patients for Virtual Visits (Telemedicine).  Patients are able to view lab/test results, encounter notes, upcoming appointments, etc.  Non-urgent messages can be sent to your provider as well.   To learn more about what you can do with MyChart, go to [email or web address].    Your next appointment:   6 month(s)  The format for your next appointment:   In Person  Provider:   Jenne Campus, MD    Other Instructions NA

## 2022-11-03 NOTE — Progress Notes (Signed)
Remote pacemaker transmission.   

## 2022-11-04 ENCOUNTER — Other Ambulatory Visit: Payer: Self-pay | Admitting: Cardiology

## 2023-01-06 ENCOUNTER — Ambulatory Visit (INDEPENDENT_AMBULATORY_CARE_PROVIDER_SITE_OTHER): Payer: Medicare HMO

## 2023-01-06 DIAGNOSIS — I441 Atrioventricular block, second degree: Secondary | ICD-10-CM | POA: Diagnosis not present

## 2023-01-06 LAB — CUP PACEART REMOTE DEVICE CHECK
Battery Remaining Longevity: 57 mo
Battery Voltage: 2.89 V
Brady Statistic AP VP Percent: 17.02 %
Brady Statistic AP VS Percent: 0 %
Brady Statistic AS VP Percent: 82.96 %
Brady Statistic AS VS Percent: 0.02 %
Brady Statistic RA Percent Paced: 16.8 %
Brady Statistic RV Percent Paced: 99.98 %
Date Time Interrogation Session: 20240516000736
Implantable Lead Connection Status: 753985
Implantable Lead Connection Status: 753985
Implantable Lead Implant Date: 20181115
Implantable Lead Implant Date: 20181115
Implantable Lead Location: 753859
Implantable Lead Location: 753860
Implantable Lead Model: 5076
Implantable Lead Model: 5076
Implantable Pulse Generator Implant Date: 20181115
Lead Channel Impedance Value: 304 Ohm
Lead Channel Impedance Value: 399 Ohm
Lead Channel Impedance Value: 437 Ohm
Lead Channel Impedance Value: 494 Ohm
Lead Channel Pacing Threshold Amplitude: 0.75 V
Lead Channel Pacing Threshold Amplitude: 1 V
Lead Channel Pacing Threshold Pulse Width: 0.4 ms
Lead Channel Pacing Threshold Pulse Width: 0.4 ms
Lead Channel Sensing Intrinsic Amplitude: 0.5 mV
Lead Channel Sensing Intrinsic Amplitude: 0.5 mV
Lead Channel Sensing Intrinsic Amplitude: 9.625 mV
Lead Channel Sensing Intrinsic Amplitude: 9.625 mV
Lead Channel Setting Pacing Amplitude: 2 V
Lead Channel Setting Pacing Amplitude: 2.5 V
Lead Channel Setting Pacing Pulse Width: 0.4 ms
Lead Channel Setting Sensing Sensitivity: 1.2 mV
Zone Setting Status: 755011
Zone Setting Status: 755011

## 2023-01-20 NOTE — Progress Notes (Signed)
Remote pacemaker transmission.   

## 2023-02-15 ENCOUNTER — Other Ambulatory Visit (HOSPITAL_COMMUNITY): Payer: Self-pay | Admitting: *Deleted

## 2023-02-17 ENCOUNTER — Other Ambulatory Visit: Payer: Self-pay

## 2023-02-17 ENCOUNTER — Encounter (HOSPITAL_COMMUNITY): Payer: Self-pay

## 2023-02-17 ENCOUNTER — Inpatient Hospital Stay (HOSPITAL_COMMUNITY)
Admission: EM | Admit: 2023-02-17 | Discharge: 2023-02-19 | DRG: 641 | Disposition: A | Discharge status: Home / Self Care | Payer: Medicare HMO | Attending: Internal Medicine | Admitting: Internal Medicine

## 2023-02-17 ENCOUNTER — Emergency Department (HOSPITAL_COMMUNITY): Payer: Medicare HMO

## 2023-02-17 DIAGNOSIS — E1165 Type 2 diabetes mellitus with hyperglycemia: Secondary | ICD-10-CM | POA: Diagnosis present

## 2023-02-17 DIAGNOSIS — N19 Unspecified kidney failure: Secondary | ICD-10-CM | POA: Diagnosis not present

## 2023-02-17 DIAGNOSIS — Z888 Allergy status to other drugs, medicaments and biological substances status: Secondary | ICD-10-CM | POA: Diagnosis not present

## 2023-02-17 DIAGNOSIS — Z79899 Other long term (current) drug therapy: Secondary | ICD-10-CM

## 2023-02-17 DIAGNOSIS — R531 Weakness: Secondary | ICD-10-CM

## 2023-02-17 DIAGNOSIS — I110 Hypertensive heart disease with heart failure: Secondary | ICD-10-CM | POA: Diagnosis present

## 2023-02-17 DIAGNOSIS — R0902 Hypoxemia: Secondary | ICD-10-CM | POA: Diagnosis present

## 2023-02-17 DIAGNOSIS — Z961 Presence of intraocular lens: Secondary | ICD-10-CM | POA: Diagnosis present

## 2023-02-17 DIAGNOSIS — D72829 Elevated white blood cell count, unspecified: Secondary | ICD-10-CM | POA: Diagnosis present

## 2023-02-17 DIAGNOSIS — Z86711 Personal history of pulmonary embolism: Secondary | ICD-10-CM | POA: Diagnosis not present

## 2023-02-17 DIAGNOSIS — I5032 Chronic diastolic (congestive) heart failure: Secondary | ICD-10-CM | POA: Diagnosis present

## 2023-02-17 DIAGNOSIS — E1142 Type 2 diabetes mellitus with diabetic polyneuropathy: Secondary | ICD-10-CM | POA: Diagnosis present

## 2023-02-17 DIAGNOSIS — Z96641 Presence of right artificial hip joint: Secondary | ICD-10-CM | POA: Diagnosis present

## 2023-02-17 DIAGNOSIS — E860 Dehydration: Secondary | ICD-10-CM | POA: Diagnosis present

## 2023-02-17 DIAGNOSIS — Z9842 Cataract extraction status, left eye: Secondary | ICD-10-CM

## 2023-02-17 DIAGNOSIS — Z9841 Cataract extraction status, right eye: Secondary | ICD-10-CM

## 2023-02-17 DIAGNOSIS — Z950 Presence of cardiac pacemaker: Secondary | ICD-10-CM

## 2023-02-17 DIAGNOSIS — Z86718 Personal history of other venous thrombosis and embolism: Secondary | ICD-10-CM | POA: Diagnosis not present

## 2023-02-17 DIAGNOSIS — R6510 Systemic inflammatory response syndrome (SIRS) of non-infectious origin without acute organ dysfunction: Secondary | ICD-10-CM | POA: Diagnosis present

## 2023-02-17 DIAGNOSIS — Z90710 Acquired absence of both cervix and uterus: Secondary | ICD-10-CM

## 2023-02-17 DIAGNOSIS — Z7901 Long term (current) use of anticoagulants: Secondary | ICD-10-CM

## 2023-02-17 DIAGNOSIS — R300 Dysuria: Secondary | ICD-10-CM | POA: Diagnosis present

## 2023-02-17 DIAGNOSIS — Z1152 Encounter for screening for COVID-19: Secondary | ICD-10-CM

## 2023-02-17 DIAGNOSIS — Z7984 Long term (current) use of oral hypoglycemic drugs: Secondary | ICD-10-CM | POA: Diagnosis not present

## 2023-02-17 DIAGNOSIS — R208 Other disturbances of skin sensation: Secondary | ICD-10-CM | POA: Diagnosis present

## 2023-02-17 DIAGNOSIS — R509 Fever, unspecified: Secondary | ICD-10-CM | POA: Diagnosis present

## 2023-02-17 DIAGNOSIS — I272 Pulmonary hypertension, unspecified: Secondary | ICD-10-CM | POA: Diagnosis present

## 2023-02-17 DIAGNOSIS — I700 Atherosclerosis of aorta: Secondary | ICD-10-CM | POA: Diagnosis present

## 2023-02-17 DIAGNOSIS — E119 Type 2 diabetes mellitus without complications: Secondary | ICD-10-CM

## 2023-02-17 DIAGNOSIS — Z87440 Personal history of urinary (tract) infections: Secondary | ICD-10-CM

## 2023-02-17 LAB — URINALYSIS, ROUTINE W REFLEX MICROSCOPIC
Bacteria, UA: NONE SEEN
Bilirubin Urine: NEGATIVE
Glucose, UA: >=500 mg/dL — AB
Ketones, ur: 20 mg/dL — AB
Leukocytes,Ua: NEGATIVE
Nitrite: NEGATIVE
Protein, ur: NEGATIVE mg/dL
Specific Gravity, Urine: 1.024 (ref 1.005–1.030)
pH: 5 (ref 5.0–8.0)

## 2023-02-17 LAB — CBC WITH DIFFERENTIAL/PLATELET
Abs Immature Granulocytes: 0.1 10*3/uL — ABNORMAL HIGH (ref 0.00–0.07)
Basophils Absolute: 0.1 10*3/uL (ref 0.0–0.1)
Basophils Relative: 0 %
Eosinophils Absolute: 0.1 10*3/uL (ref 0.0–0.5)
Eosinophils Relative: 1 %
HCT: 42.1 % (ref 36.0–46.0)
Hemoglobin: 12.9 g/dL (ref 12.0–15.0)
Immature Granulocytes: 1 %
Lymphocytes Relative: 3 %
Lymphs Abs: 0.6 10*3/uL — ABNORMAL LOW (ref 0.7–4.0)
MCH: 25.3 pg — ABNORMAL LOW (ref 26.0–34.0)
MCHC: 30.6 g/dL (ref 30.0–36.0)
MCV: 82.7 fL (ref 80.0–100.0)
Monocytes Absolute: 0.6 10*3/uL (ref 0.1–1.0)
Monocytes Relative: 4 %
Neutro Abs: 16.2 10*3/uL — ABNORMAL HIGH (ref 1.7–7.7)
Neutrophils Relative %: 91 %
Platelets: 242 10*3/uL (ref 150–400)
RBC: 5.09 MIL/uL (ref 3.87–5.11)
RDW: 15 % (ref 11.5–15.5)
WBC: 17.6 10*3/uL — ABNORMAL HIGH (ref 4.0–10.5)
nRBC: 0 % (ref 0.0–0.2)

## 2023-02-17 LAB — PROTIME-INR
INR: 1.3 — ABNORMAL HIGH (ref 0.8–1.2)
Prothrombin Time: 16.2 seconds — ABNORMAL HIGH (ref 11.4–15.2)

## 2023-02-17 LAB — COMPREHENSIVE METABOLIC PANEL
ALT: 11 U/L (ref 0–44)
AST: 18 U/L (ref 15–41)
Albumin: 4 g/dL (ref 3.5–5.0)
Alkaline Phosphatase: 42 U/L (ref 38–126)
Anion gap: 12 (ref 5–15)
BUN: 25 mg/dL — ABNORMAL HIGH (ref 8–23)
CO2: 26 mmol/L (ref 22–32)
Calcium: 10.1 mg/dL (ref 8.9–10.3)
Chloride: 97 mmol/L — ABNORMAL LOW (ref 98–111)
Creatinine, Ser: 0.86 mg/dL (ref 0.44–1.00)
GFR, Estimated: >60 mL/min (ref >60)
Glucose, Bld: 180 mg/dL — ABNORMAL HIGH (ref 70–99)
Potassium: 3.8 mmol/L (ref 3.5–5.1)
Sodium: 135 mmol/L (ref 135–145)
Total Bilirubin: 0.9 mg/dL (ref 0.3–1.2)
Total Protein: 7.6 g/dL (ref 6.5–8.1)

## 2023-02-17 LAB — RESP PANEL BY RT-PCR (RSV, FLU A&B, COVID)  RVPGX2
Influenza A by PCR: NEGATIVE
Influenza B by PCR: NEGATIVE
Resp Syncytial Virus by PCR: NEGATIVE
SARS Coronavirus 2 by RT PCR: NEGATIVE

## 2023-02-17 LAB — APTT: aPTT: 30 seconds (ref 24–36)

## 2023-02-17 LAB — LACTIC ACID, PLASMA: Lactic Acid, Venous: 1.6 mmol/L (ref 0.5–1.9)

## 2023-02-17 MED ORDER — ACETAMINOPHEN 325 MG PO TABS
650.0000 mg | ORAL_TABLET | Freq: Four times a day (QID) | ORAL | Status: DC | PRN
  Administered 2023-02-18 – 2023-02-19 (×3): 650 mg via ORAL
  Filled 2023-02-17 (×3): qty 2

## 2023-02-17 MED ORDER — MELATONIN 3 MG PO TABS: 3.0000 mg | ORAL_TABLET | Freq: Every evening | ORAL | Status: DC | PRN

## 2023-02-17 MED ORDER — VANCOMYCIN HCL IN DEXTROSE 1-5 GM/200ML-% IV SOLN
1000.0000 mg | Freq: Once | INTRAVENOUS | Status: AC
  Administered 2023-02-18: 1000 mg via INTRAVENOUS
  Filled 2023-02-17: qty 200

## 2023-02-17 MED ORDER — ACETAMINOPHEN 650 MG RE SUPP: 650.0000 mg | Freq: Four times a day (QID) | RECTAL | Status: DC | PRN

## 2023-02-17 MED ORDER — METRONIDAZOLE 500 MG/100ML IV SOLN
500.0000 mg | Freq: Once | INTRAVENOUS | Status: AC
  Administered 2023-02-18: 500 mg via INTRAVENOUS
  Filled 2023-02-17: qty 100

## 2023-02-17 MED ORDER — ACETAMINOPHEN 325 MG PO TABS
650.0000 mg | ORAL_TABLET | Freq: Once | ORAL | Status: AC
  Administered 2023-02-17: 650 mg via ORAL
  Filled 2023-02-17: qty 2

## 2023-02-17 MED ORDER — LACTATED RINGERS IV SOLN: INTRAVENOUS | Status: DC

## 2023-02-17 MED ORDER — SODIUM CHLORIDE 0.9 % IV BOLUS
500.0000 mL | Freq: Once | INTRAVENOUS | Status: AC
  Administered 2023-02-17: 500 mL via INTRAVENOUS

## 2023-02-17 MED ORDER — SODIUM CHLORIDE 0.9 % IV SOLN
2.0000 g | Freq: Once | INTRAVENOUS | Status: AC
  Administered 2023-02-17: 2 g via INTRAVENOUS
  Filled 2023-02-17: qty 12.5

## 2023-02-17 MED ORDER — ONDANSETRON HCL 4 MG/2ML IJ SOLN: 4.0000 mg | Freq: Four times a day (QID) | INTRAMUSCULAR | Status: DC | PRN

## 2023-02-17 NOTE — ED Provider Notes (Signed)
EMERGENCY DEPARTMENT AT [facility] Provider Note   CSN: [id number] Arrival date & time: 02/17/23  2140     History HNT, PE on eliquis Chief Complaint  Patient presents with   Fever    UTI    Lindsey Ross is a 89 y.o. female.  89 y.o female with a PMH of HTN, PE on Eliquis presents to the ED with a chief complaint of urinary tract infection.  Patient's daughter reports that patient was treated for urinary tract infection approximately 1 week ago.  Was sent home with a 7-day course of Macrobid which she completed.  Her daughter reports she did not feel any improvement in symptoms after completing this antibiotic.  Patient states that she has had increasing sleeping, feeling fatigued, feeling like she does not have any energy.  She also complains of a headache that is been ongoing for the last couple days along with some generalized bodyaches.  He has not taken any other medication aside from her antibiotics.  She did call her PCP 2 days ago who placed her on a second round of Macrobid this time for a 10-day course, she has had a total of 2 days worth of this antibiotic.  She denies any abdominal pain, chest pain, shortness of breath. Of note, when EMS arrived to patient's house, she was found to be hypoxic with an oxygen saturation of 89% on room air, placed on 2 L nasal cannula.  Patient does not wear any oxygen at baseline.  The history is provided by the patient.  Fever Max temp prior to arrival:  100.9 Temp source:  Rectal Severity:  Moderate Onset quality:  Sudden Associated symptoms: chills and dysuria   Associated symptoms: no chest pain, no nausea and no vomiting        Home Medications Prior to Admission medications   Medication Sig Start Date End Date Taking? Authorizing Provider  acetaminophen (TYLENOL) 500 MG tablet Take 1,000 mg by mouth daily.   Yes [provider]  albuterol (PROVENTIL HFA;VENTOLIN HFA) 108 (90 Base) MCG/ACT  inhaler Inhale 1-2 puffs into the lungs every 6 (six) hours as needed for wheezing or shortness of breath.   Yes [provider]  apixaban (ELIQUIS) 5 MG TABS tablet Take 5 mg by mouth 2 (two) times daily.   Yes [provider]  Calcium Carb-Cholecalciferol (CALCIUM + D3 PO) Take 1 tablet by mouth 2 (two) times daily. Unknown strength   Yes [provider]  furosemide (LASIX) 40 MG tablet Take 1 tablet (40 mg total) by mouth daily. 11/05/22  Yes Georgeanna Lea, MD  gabapentin (NEURONTIN) 100 MG capsule Take 100 mg by mouth 2 (two) times daily.   Yes [provider]  JARDIANCE 10 MG TABS tablet Take 10 mg by mouth daily. 12/17/22  Yes [provider]  MACROBID 100 MG capsule Take 100 mg by mouth 2 (two) times daily. 02/16/23  Yes [provider]  Multiple Vitamins-Minerals (MULTIVITAMIN WITH MINERALS) tablet Take 1 tablet by mouth daily. Unknown strength   Yes [provider]  telmisartan-hydrochlorothiazide (MICARDIS HCT) 80-25 MG tablet Take 0.5 tablets by mouth daily. Patient not taking: Reported on 02/17/2023 11/02/22   Georgeanna Lea, MD      Allergies    Alendronate sodium    Review of Systems   Review of Systems  Constitutional:  Positive for chills and fever.  Respiratory:  Negative for shortness of breath.   Cardiovascular:  Negative for  chest pain.  Gastrointestinal:  Negative for abdominal pain, nausea and vomiting.  Genitourinary:  Positive for dysuria.  Musculoskeletal:  Negative for back pain.  Neurological:  Positive for weakness.  All other systems reviewed and are negative.   Physical Exam Updated Vital Signs BP (!) 136/90   Pulse 87   Temp (!) 100.9 F (38.3 C) (Rectal)   Resp 17   Ht 5\' 4"  (1.626 m)   Wt 65.8 kg   SpO2 96%   BMI 24.89 kg/m  Physical Exam Vitals and nursing note reviewed.  Constitutional:      Appearance: She is not ill-appearing.  HENT:     Head: Normocephalic and  atraumatic.     Mouth/Throat:     Mouth: Mucous membranes are dry.  Eyes:     Pupils: Pupils are equal, round, and reactive to light.  Cardiovascular:     Rate and Rhythm: Normal rate.  Pulmonary:     Effort: Pulmonary effort is normal.     Comments: Diminished throughout, no audible rales.  Abdominal:     General: Abdomen is flat.     Palpations: Abdomen is soft.     Tenderness: There is no abdominal tenderness. There is no right CVA tenderness or left CVA tenderness.  Musculoskeletal:     Cervical back: Normal range of motion and neck supple.  Skin:    General: Skin is warm and dry.  Neurological:     Mental Status: She is alert and oriented to person, place, and time.     ED Results / Procedures / Treatments   Labs (all labs ordered are listed, but only abnormal results are displayed) Labs Reviewed  CBC WITH DIFFERENTIAL/PLATELET - Abnormal; Notable for the following components:      Result Value   WBC 17.6 (*)    MCH 25.3 (*)    Neutro Abs 16.2 (*)    Lymphs Abs 0.6 (*)    Abs Immature Granulocytes 0.10 (*)    All other components within normal limits  COMPREHENSIVE METABOLIC PANEL - Abnormal; Notable for the following components:   Chloride 97 (*)    Glucose, Bld 180 (*)    BUN 25 (*)    All other components within normal limits  URINALYSIS, ROUTINE W REFLEX MICROSCOPIC - Abnormal; Notable for the following components:   APPearance HAZY (*)    Glucose, UA >=500 (*)    Hgb urine dipstick SMALL (*)    Ketones, ur 20 (*)    All other components within normal limits  PROTIME-INR - Abnormal; Notable for the following components:   Prothrombin Time 16.2 (*)    INR 1.3 (*)    All other components within normal limits  RESP PANEL BY RT-PCR (RSV, FLU A&B, COVID)  RVPGX2  URINE CULTURE  CULTURE, BLOOD (ROUTINE X 2)  CULTURE, BLOOD (ROUTINE X 2)  LACTIC ACID, PLASMA  APTT  LACTIC ACID, PLASMA    EKG EKG Interpretation Date/Time:  Thursday February 17 2023 21:57:47  EDT Ventricular Rate:  79 PR Interval:  209 QRS Duration:  178 QT Interval:  439 QTC Calculation: 504 R Axis:   -80  Text Interpretation: Wandering atrial pacemaker LVH with IVCD, LAD and secondary repol abnrm Prolonged QT interval since last tracing no significant change Confirmed by Rolan Bucco [id number]) on 02/17/2023 11:03:17 PM  Radiology DG Chest Portable 1 View  Result Date: 02/17/2023 CLINICAL DATA:  Hypoxia EXAM: PORTABLE CHEST 1 VIEW COMPARISON:  08/25/2022 FINDINGS: Lung volumes are small. Large  hiatal hernia again noted. Associated left basilar interstitial changes and atelectasis is unchanged, better appreciated on CT examination of 08/25/2022. No pneumothorax or pleural effusion. Stable cardiomegaly. Left subclavian dual lead pacemaker is unchanged. No acute bone abnormality. IMPRESSION: 1. Pulmonary hypoinflation. 2. Large hiatal hernia. Electronically Signed   By: Helyn Numbers M.D.   On: 02/17/2023 22:56    Procedures Procedures    Medications Ordered in ED Medications  lactated ringers infusion (has no administration in time range)  metroNIDAZOLE (FLAGYL) IVPB 500 mg (has no administration in time range)  vancomycin (VANCOCIN) IVPB 1000 mg/200 mL premix (has no administration in time range)  ceFEPIme (MAXIPIME) 2 g in sodium chloride 0.9 % 100 mL IVPB (2 g Intravenous New Bag/Given 02/17/23 2320)  acetaminophen (TYLENOL) tablet 650 mg (650 mg Oral Given 02/17/23 2304)  sodium chloride 0.9 % bolus 500 mL (500 mLs Intravenous New Bag/Given 02/17/23 2320)    ED Course/ Medical Decision Making/ A&P                             Medical Decision Making Amount and/or Complexity of Data Reviewed Labs: ordered. Radiology: ordered. ECG/medicine tests: ordered.  Risk OTC drugs. Prescription drug management.   This patient presents to the ED for concern of UTI, this involves a number of treatment options, and is a complaint that carries with it a high risk of  complications and morbidity.  The differential diagnosis includes urinary tract infection, pneumonia, electrolyte derangement.    Co morbidities: Discussed in HPI   Brief History:  See HPI.  EMR reviewed including pt PMHx, past surgical history and past visits to ER.   See HPI for more details   Lab Tests:  I ordered and independently interpreted labs.  The pertinent results include:   Interpretation of her blood work revealed CBC with a leukocytosis of 17.6, hemoglobin is within normal limits.  CMP with no electrolyte derangement however slight elevation of her BUN.  Creatinine level was within normal limits.  LFTs are unremarkable and she does not complain of any abdominal pain at this time.  Imaging Studies:  NAD. I personally reviewed all imaging studies and no acute abnormality found. I agree with radiology interpretation.   Cardiac Monitoring:  The patient was maintained on a cardiac monitor.  I personally viewed and interpreted the cardiac monitored which showed an underlying rhythm of: Pacemaker  EKG non-ischemic   Medicines ordered:  I ordered medication including broad-spectrum antibiotics such as cefepime, Flagyl, vancomycin for treatment of sepsis due to unknown source.  Reevaluation of the patient after these medicines showed that the patient stayed the same I have reviewed the patients home medicines and have made adjustments as needed   Critical Interventions:  Based on white blood cell count, fever, hypoxia code sepsis was activated and patient was started on broad-spectrum antibiotics.   Social Determinants of Health:  The patient's social determinants of health were a factor in the care of this patient    Problem List / ED Course:  Patient presents to the ED via EMS with a  chief complaint of of recurrent UTI.  Recently completed 7-day course of Macrobid without any improvement in symptoms.  Patient reports that she continues to be sleepy,  continues to feel fatigued, daughter at the bedside reports she is somewhat lethargic.  Patient currently lives alone and cares for herself on her own, she reports extensive hours sleeping, following she has had  no energy to move around.  She was found to be hypoxic on arrival with EMS with an oxygen saturation of 89% on room air, placed on 2 L nasal cannula. On arrival, patient was satting at 96%, therefore I removed her O2 and she decreased to 98% on room air.  She does not appear to be in respiratory distress.  She does have prior history of pulmonary embolism and is currently anticoagulated on Eliquis.  Patient had a rectal temp of 100.9, white blood cell count on her CBC of 17,000, no electrolyte derangement on her CMP.  Lactic acid was also obtained which was negative. UA has returned without any leukocytosis, no nitrites, no concerns for infection at this time from a urinary source, specimen was obtained via In-N-Out catheter. Patient with ongoing hypoxia on 2 L nasal cannula, respiratory panel was ordered.  Chest x-ray does not show any acute process, however with new hypoxia I do feel that patient meets criteria for admission at this time. Patient with with pyrexia of unknown source, weak however broad-spectrum antibiotics, I do feel that she meets criteria for admission at this time.  Patient is hemodynamically stable for admission. Spoke to Dr. Dalene Carrow, hospitalist service who will admit patient for further management. Appreciate his assistance.   Dispostion:  After consideration of the diagnostic results and the patients response to treatment, I feel that the patent would benefit from admission for hypoxia.     Portions of this note were generated with Scientist, clinical (histocompatibility and immunogenetics). Dictation errors may occur despite best attempts at proofreading.   Final Clinical Impression(s) / ED Diagnoses Final diagnoses:  Fever, unspecified fever cause  Hypoxia    Rx / DC Orders ED Discharge Orders      None         Claude Manges, PA-C 02/17/23 2353    Rolan Bucco, MD 02/19/23 1558

## 2023-02-17 NOTE — Progress Notes (Signed)
A consult was received from an ED physician for vancomycin and cefepime per pharmacy dosing.  The patient's profile has been reviewed for ht/wt/allergies/indication/available labs.   A one time order has been placed for vancomycin 1gm and cefepime 2gm.     Further antibiotics/pharmacy consults should be ordered by admitting physician if indicated.                       Thank you, Arley Phenix RPh 02/17/2023, 10:49 PM

## 2023-02-17 NOTE — Progress Notes (Signed)
Elink monitoring for the code sepsis protocol.  

## 2023-02-17 NOTE — ED Triage Notes (Signed)
BIBA - Positive UTI by pcp 13 days ago. Started on abx - finished full 7 day course. EMS states her daughter called them due to fever of 100.7 and generalized body aches today.

## 2023-02-17 NOTE — H&P (Signed)
History and Physical      Lindsey Ross UEA:[id number] DOB: [birth year] DOA: 02/17/2023; DOS: 02/17/2023  PCP: Chilton Greathouse, MD  Patient coming from: home   I have personally briefly reviewed patient's old medical records in [facility]  Chief Complaint: Generalized weakness  HPI: Lindsey Ross is a 89 y.o. female with medical history significant for DVT/PE chronically anticoagulated on Eliquis, high-grade heart block status post pacemaker placement in May 2019, type 2 diabetes mellitus complicated by diabetic peripheral polyneuropathy, chronic diastolic heart failure, who is admitted to [facility] on 02/17/2023 with generalized weakness after presenting from home to [facility] ED complaining of generalized weakness.   The patient reports 4 to 5 days of generalized weakness in the absence of any acute focal weakness.  She also reports lethargy, increased somnolence, including increased sleep requirements, conveying that she has been sleeping several hours more per day over that timeframe relative to her baseline daily sleep requirements.  Over that timeframe, she conveys significant decline in oral intake of both food and water.  She reports some generalized myalgias, but in the absence of subjective fever, chills, or rigors.  She notes that 2 weeks ago, she began experiencing new onset dysuria in the absence of any gross hematuria or change in urinary urgency/frequency.  This prompted a telehealth consultation with her PCP, who clinically, on the basis of the patient's new onset dysuria, suspected underlying acute cystitis, prompting initiation of a course of Macrobid.  Patient reports that she has compliantly completed this course of Macrobid, and has noted only slight improvement in her dysuria, noting an element of perpetuation of this symptom.   She denies any recent neck stiffness, rash, shortness of breath, cough, rhinitis, rhinorrhea, sore throat, hemoptysis, abdominal pain,  nausea, vomiting, diarrhea.   Denies any known chronic underlying pulmonary pathology, and also denies any known baseline supplemental oxygen requirements.  It is noted that in January 2024, she had a clear appearing chest x-ray, with ensuing CT chest showing evidence of interstitial pneumonia.  She conveys that she lives alone at home, and is able to, at baseline, independently perform all of her own ADLs and is able to ambulate without assistance at baseline.  In the setting of her generalized weakness, she presents to the emergency department this evening for further evaluation and management thereof.    ED Course:  Vital signs in the ED were notable for the following: Temperature max 100.9; heart rates in the 70s to 90s; stop blood pressures in the 120s 130s; respiratory rate 16-23, initial oxygen saturation 89% on room air, [place] improving into the range of 95 to 99% on 2 L nasal cannula.  Labs were notable for the following: CMP notable for the following: Sodium 135, bicarbonate 26, creatinine 0.86 relative to most recent prior serum creatinine data point of 1.08 on 08/25/2022, BUN/creatinine ratio 29, glucose 180, liver enzymes within normal limits.  Lactic acid 1.6.  CBC notable for 17,600 with 91% neutrophils, relative to white blood cell count of 8.2 in January 2024.  Urinalysis associated with a hazy appearing specimen with no white blood cells, leukocyte esterase/nitrate negative, no bacteria, no squamous of the will cells, while showing small hemoglobin in the absence of any RBCs, and also notable for specific gravity 1.024.  COVID, influenza, RSV PCR all negative.  Blood cultures x 2 urine culture were collected prior to initiation of IV antibiotics.  EKG in ED was read as follows: In comparison to most recent prior  EKG from 08/25/2022, today's EKG demonstrates wandering atrial pacemaker with heart rate 79, intraventricular conduction delay, nonspecific T wave inversion in aVL, unchanged  from most recent prior EKG, also showing nonspecific less than 1 mm ST depression in aVL, also unchanged from most recent prior EKG.  Imaging and additional notable ED work-up: Per formal radiology read, 1 view chest x-ray shows hypoinflation, demonstrating no evidence of acute cardiopulmonary process, including no evidence of infiltrate, edema, effusion, or pneumothorax.  While in the ED, the following were administered: Acetaminophen 650 mg p.o. x 1, cefepime, IV vancomycin, IV Flagyl, normal saline x 500 cc bolus followed by initiation of continuous LR running at 150 cc/h.  Subsequently, the patient was admitted for further evaluation management of generalized weakness in the setting of the presence of SIRS criteria as well as clinical evidence to suggest dehydration.    Review of Systems: As per HPI otherwise 10 point review of systems negative.   Past Medical History:  Diagnosis Date   Acute bilateral low back pain without sciatica 08/31/2016   Acute deep vein thrombosis (DVT) of left tibial vein (HCC) 06/26/2017   Acute pain of right wrist 08/31/2016   Aortic atherosclerosis (HCC) 06/25/2017   Arthritis    "right ankle; maybe some in her back" (07/07/2017)   Bradyarrhythmia 06/23/2017   Burning sensation of feet    Bilateral feet   Closed fracture distal radius and ulna, right, initial encounter 08/31/2016   DVT (deep venous thrombosis) (HCC) 06/24/2017   LLE   Essential hypertension 06/23/2017   Frequent urination    Heart block AV second degree 06/24/2017   Hypertension    Osteoarthritis of right hip 03/25/2015   Pacemaker 01/13/2018   Presence of permanent cardiac pacemaker    Pulmonary embolism (HCC) 06/24/2017   Pulmonary hypertension, unspecified (HCC) 12/28/2017   70 mmHg by echo in October 2018   Shortness of breath dyspnea    with exertion   SOB (shortness of breath) on exertion 06/23/2017   Status post total replacement of right hip 03/25/2015    Past Surgical History:   Procedure Laterality Date   ABDOMINAL HYSTERECTOMY     ANKLE FRACTURE SURGERY Right 2003   CATARACT EXTRACTION W/ INTRAOCULAR LENS  IMPLANT, BILATERAL Bilateral    FRACTURE SURGERY     INSERT / REPLACE / REMOVE PACEMAKER  07/07/2017   JOINT REPLACEMENT     LAPAROSCOPIC CHOLECYSTECTOMY     PACEMAKER IMPLANT N/A 07/07/2017   Procedure: PACEMAKER IMPLANT;  Surgeon: Regan Lemming, MD;  Location: [facility] INVASIVE CV LAB;  Service: Cardiovascular;  Laterality: N/A;   TOTAL HIP ARTHROPLASTY Right 03/25/2015   Procedure: RIGHT TOTAL HIP ARTHROPLASTY ANTERIOR APPROACH;  Surgeon: Kathryne Hitch, MD;  Location: [facility] OR;  Service: Orthopedics;  Laterality: Right;    Social History:  reports that she has never smoked. She has never used smokeless tobacco. She reports that she does not drink alcohol and does not use drugs.   Allergies  Allergen Reactions   Alendronate Sodium Other (See Comments)    Family History  Problem Relation Age of Onset   Pulmonary embolism Neg Hx     Family history reviewed and not pertinent    Prior to Admission medications   Medication Sig Start Date End Date Taking? Authorizing Provider  acetaminophen (TYLENOL) 500 MG tablet Take 1,000 mg by mouth daily.   Yes [provider]  albuterol (PROVENTIL HFA;VENTOLIN HFA) 108 (90 Base) MCG/ACT inhaler Inhale 1-2 puffs into the lungs  every 6 (six) hours as needed for wheezing or shortness of breath.   Yes [provider]  apixaban (ELIQUIS) 5 MG TABS tablet Take 5 mg by mouth 2 (two) times daily.   Yes [provider]  Calcium Carb-Cholecalciferol (CALCIUM + D3 PO) Take 1 tablet by mouth 2 (two) times daily. Unknown strength   Yes [provider]  furosemide (LASIX) 40 MG tablet Take 1 tablet (40 mg total) by mouth daily. 11/05/22  Yes Georgeanna Lea, MD  gabapentin (NEURONTIN) 100 MG capsule Take 100 mg by mouth 2 (two) times daily.   Yes [provider]  JARDIANCE  10 MG TABS tablet Take 10 mg by mouth daily. 12/17/22  Yes [provider]  MACROBID 100 MG capsule Take 100 mg by mouth 2 (two) times daily. 02/16/23  Yes [provider]  Multiple Vitamins-Minerals (MULTIVITAMIN WITH MINERALS) tablet Take 1 tablet by mouth daily. Unknown strength   Yes [provider]  telmisartan-hydrochlorothiazide (MICARDIS HCT) 80-25 MG tablet Take 0.5 tablets by mouth daily. Patient not taking: Reported on 02/17/2023 11/02/22   Georgeanna Lea, MD     Objective    Physical Exam: Vitals:   02/17/23 2148 02/17/23 2149 02/17/23 2153 02/17/23 2239  BP:  130/72  (!) 136/90  Pulse:  96  87  Resp:  20  17  Temp: 98.9 F (37.2 C) 98.9 F (37.2 C)  (!) 100.9 F (38.3 C)  TempSrc: Oral Oral  Rectal  SpO2:  95%  96%  Weight:   65.8 kg   Height:   5\' 4"  (1.626 m)     General: appears to be stated age; alert, oriented Skin: warm, dry, no rash Head:  AT/[place] Mouth:  Oral mucosa membranes appear dry, normal dentition Neck: supple; trachea midline Heart:  RRR; did not appreciate any M/R/G Lungs: CTAB, did not appreciate any wheezes, rales, or rhonchi Abdomen: + BS; soft, ND, NT Vascular: 2+ pedal pulses b/l; 2+ radial pulses b/l Extremities: no peripheral edema, no muscle wasting Neuro: strength and sensation intact in upper and lower extremities b/l    Labs on Admission: I have personally reviewed following labs and imaging studies  CBC: Recent Labs  Lab 02/17/23 2210  WBC 17.6*  NEUTROABS 16.2*  HGB 12.9  HCT 42.1  MCV 82.7  PLT 242   Basic Metabolic Panel: Recent Labs  Lab 02/17/23 2210  NA 135  K 3.8  CL 97*  CO2 26  GLUCOSE 180*  BUN 25*  CREATININE 0.86  CALCIUM 10.1   GFR: Estimated Creatinine Clearance: 36.5 mL/min (by C-G formula based on SCr of 0.86 mg/dL). Liver Function Tests: Recent Labs  Lab 02/17/23 2210  AST 18  ALT 11  ALKPHOS 42  BILITOT 0.9  PROT 7.6  ALBUMIN 4.0   No results for  input(s): "LIPASE", "AMYLASE" in the last 168 hours. No results for input(s): "AMMONIA" in the last 168 hours. Coagulation Profile: Recent Labs  Lab 02/17/23 2245  INR 1.3*   Cardiac Enzymes: No results for input(s): "CKTOTAL", "CKMB", "CKMBINDEX", "TROPONINI" in the last 168 hours. BNP (last 3 results) No results for input(s): "PROBNP" in the last 8760 hours. HbA1C: No results for input(s): "HGBA1C" in the last 72 hours. CBG: No results for input(s): "GLUCAP" in the last 168 hours. Lipid Profile: No results for input(s): "CHOL", "HDL", "LDLCALC", "TRIG", "CHOLHDL", "LDLDIRECT" in the last 72 hours. Thyroid Function Tests: No results for input(s): "TSH", "T4TOTAL", "FREET4", "T3FREE", "THYROIDAB" in the last  72 hours. Anemia Panel: No results for input(s): "VITAMINB12", "FOLATE", "FERRITIN", "TIBC", "IRON", "RETICCTPCT" in the last 72 hours. Urine analysis:    Component Value Date/Time   COLORURINE YELLOW 02/17/2023 2238   APPEARANCEUR HAZY (A) 02/17/2023 2238   LABSPEC 1.024 02/17/2023 2238   PHURINE 5.0 02/17/2023 2238   GLUCOSEU >=500 (A) 02/17/2023 2238   HGBUR SMALL (A) 02/17/2023 2238   BILIRUBINUR NEGATIVE 02/17/2023 2238   KETONESUR 20 (A) 02/17/2023 2238   PROTEINUR NEGATIVE 02/17/2023 2238   NITRITE NEGATIVE 02/17/2023 2238   LEUKOCYTESUR NEGATIVE 02/17/2023 2238    Radiological Exams on Admission: DG Chest Portable 1 View  Result Date: 02/17/2023 CLINICAL DATA:  Hypoxia EXAM: PORTABLE CHEST 1 VIEW COMPARISON:  08/25/2022 FINDINGS: Lung volumes are small. Large hiatal hernia again noted. Associated left basilar interstitial changes and atelectasis is unchanged, better appreciated on CT examination of 08/25/2022. No pneumothorax or pleural effusion. Stable cardiomegaly. Left subclavian dual lead pacemaker is unchanged. No acute bone abnormality. IMPRESSION: 1. Pulmonary hypoinflation. 2. Large hiatal hernia. Electronically Signed   By: Helyn Numbers M.D.   On:  02/17/2023 22:56      Assessment/Plan    Principal Problem:   Generalized weakness Active Problems:   DM2 (diabetes mellitus, type 2) (HCC)   SIRS (systemic inflammatory response syndrome) (HCC)   Dehydration   Acute prerenal azotemia   History of pulmonary embolism   Chronic diastolic CHF (congestive heart failure) (HCC)      #) Generalized weakness: 4- 5 day duration of generalized weakness, in the absence of any evidence of acute focal neurologic deficits, including no evidence of acute focal weakness to suggest acute CVA. Suspect contribution from physiologic stress stemming from potentially multifactorial contributing factors, including clinical suspicion for dehydration, as well as the possibility of underlying infectious process, although no definitive source of underlying infection has been identified at this time, as further detailed below.  Of note, in the setting of concomitant generalized myalgias as well as urinalysis findings of small hemoglobin in the absence of RBCs, potentially suggestive of underlying myoglobinuria, will add on CPK level.  Will further eval for any additional contributions from endocrine/metabolic sources, as detailed below.    Plan: work-up and management of presenting dehydration, as described below, including IV fluids. PT/OT consults ordered for the AM. Fall precautions. CMP/CBC in the AM. Check TSH, serum Mg level. Check CPK level, B12.  Further infectious workup, including CT chest, as further outlined below.  Procalcitonin level.             #) SIRS criteria: SIRS criteria met via presenting leukocytosis with 91% neutrophils, concerning for potential underlying bacterial infection, along with the presence of mild tachycardia and tachypnea.  He also has a mildly elevated temperature at presentation at 38.3, although this does not meet quantitative threshold for inclusion in SIRS criteria, set at temperature values greater than 38.3  Celsius.  However, in the absence of any overt underlying infectious process, criteria for sepsis not currently met.  Will further evaluate for evidence of underlying infectious process, as outlined below.  Presenting urinalysis not suggestive of urinary tract infection.  However, it is noted that she recently completed a course of Macrobid, potentially manipulating the results of this urinalysis, will noting that the patient does continue to experience a degree of dysuria.  Will follow closely for results of urine culture collected this evening prior to initiation of IV antibiotics.  It is noted that her COVID, Valenza, and RSV PCR were all  negative.  Additionally, her chest x-ray showed no evidence of acute cardiopulmonary process.  However, this plain film of the chest was noted to be associated with hypoinflation, and, in tandem with clinical evidence of dehydration, can be associated with an increased risk for falls radiographic negative in terms of infectious evaluation.  Specifically, per chart review, it was found that in January 2024 that she had a clear appearing chest x-ray, with ensuing CT chest showing evidence of interstitial pneumonia.  Given this history and the above clinical findings, will pursue CT chest to further evaluate for process contributing to the above SIRS criteria.  Will also add on procalcitonin for this purpose.  Given the presence of the SIRS criteria and history of her interstitial pneumonia, will proceed with Rocephin and azithromycin for now, pending the results of this additional evaluation.  Of note, the hypoinflation identified on today's chest x-ray, may be representative of underlying atelectasis, which may also contributed to mildly elevated presenting temperature.  Of note, no meningeal signs to suggest meningitis.  No rash.  No abdominal pain or acute transaminitis.  As described above, blood cultures x 2 were collected in the ED prior to initiation of  broad-spectrum IV antibiotics in the form of IV vancomycin, cefepime, and Flagyl.  She appears hemodynamically stable at this time.  Lactic acid not elevated.   Plan: Follow-up results of blood cultures x 2 as well as urine culture.  Gentle IV fluids, as above.  Rocephin, azithromycin, as above.  Add on procalcitonin.  CT chest, as above.  Incentive spirometry.  Repeat CMP, CBC in the morning.  Monitor strict I's and O's and daily weights.  Prn acetaminophen for fever.                       #) Dehydration: Clinical suspicion for such, including the appearance of dry oral mucous membranes as well as laboratory findings notable for acute prerenal azotemia and UA demonstrating elevated specific gravity.  Appears to be in the setting of   recent plan and oral intake, as outlined above.  No e/o associated hypotension.   Plan: Monitor strict I's and O's.  Daily weights.  CMP in the morning.  Will continue lactated Ringer's, but reduce rate from 150 cc/h to 100 cc/h given her age and documented history of chronic diastolic heart failure.                 #) History of DVT/PE: History of multiple prior thromboembolic processes, including documentation of history of DVT involving the left lower extremity in November 2018 as well as a history of pulmonary embolism.  Following these events, she is now chronically anticoagulated on Eliquis.  Plan: Continue outpatient Eliquis.                #) Type 2 Diabetes Mellitus: documented history of such, and complicated by diabetic peripheral polyneuropathy, for which she is on gabapentin at home. Home insulin regimen: None. Home oral hypoglycemic agents: Jardiance. presenting blood sugar: 180.  Per brief chart review, no prior hemoglobin A1c result on file.  Plan: accuchecks QAC and HS with low dose SSI. hold home oral hypoglycemic agents during this hospitalization.  Check hemoglobin A1c level.  Continue outpatient  gabapentin.                    #) Chronic diastolic heart failure: documented history of such, with most recent echocardiogram performed in June 2021, which is notable for  LVEF 55 to 60%, no focal wall motion normalities, grade 1 diastolic dysfunction, normal right ventricular systolic function, as well as mild mitral vegetation and mild aortic regurgitation. No clinical evidence to suggest acutely decompensated heart failure at this time. home diuretic regimen reportedly consists of the following: Lasix 40 mg p.o. daily.  In the setting of clinical evidence to suggest presenting dehydration, will hold home Lasix for now.  Plan: monitor strict I's & O's and daily weights. Repeat CMP in AM. Check serum mag level.  Hold home Lasix for now.  Add on BNP.      DVT prophylaxis: SCD's + home Eliquis Code Status: Full code Family Communication: none Disposition Plan: Per Rounding Team Consults called: none;  Admission status: Inpatient    I SPENT GREATER THAN 75  MINUTES IN CLINICAL CARE TIME/MEDICAL DECISION-MAKING IN COMPLETING THIS ADMISSION.     Chaney Born Excell Neyland DO [facility] From 7PM - 7AM   02/17/2023, 11:56 PM

## 2023-02-18 ENCOUNTER — Inpatient Hospital Stay (HOSPITAL_COMMUNITY): Admission: RE | Admit: 2023-02-18 | Payer: Medicare HMO | Source: Ambulatory Visit

## 2023-02-18 ENCOUNTER — Inpatient Hospital Stay (HOSPITAL_COMMUNITY): Payer: Medicare HMO

## 2023-02-18 ENCOUNTER — Encounter (HOSPITAL_COMMUNITY): Payer: Self-pay | Admitting: Internal Medicine

## 2023-02-18 DIAGNOSIS — E860 Dehydration: Secondary | ICD-10-CM | POA: Diagnosis present

## 2023-02-18 DIAGNOSIS — I5032 Chronic diastolic (congestive) heart failure: Secondary | ICD-10-CM | POA: Diagnosis not present

## 2023-02-18 DIAGNOSIS — Z86711 Personal history of pulmonary embolism: Secondary | ICD-10-CM | POA: Diagnosis not present

## 2023-02-18 DIAGNOSIS — N19 Unspecified kidney failure: Secondary | ICD-10-CM | POA: Diagnosis present

## 2023-02-18 DIAGNOSIS — R531 Weakness: Secondary | ICD-10-CM | POA: Diagnosis not present

## 2023-02-18 DIAGNOSIS — R6510 Systemic inflammatory response syndrome (SIRS) of non-infectious origin without acute organ dysfunction: Secondary | ICD-10-CM | POA: Diagnosis present

## 2023-02-18 LAB — CBC WITH DIFFERENTIAL/PLATELET
Abs Immature Granulocytes: 0.03 10*3/uL (ref 0.00–0.07)
Basophils Absolute: 0 10*3/uL (ref 0.0–0.1)
Basophils Relative: 0 %
Eosinophils Absolute: 0.5 10*3/uL (ref 0.0–0.5)
Eosinophils Relative: 4 %
HCT: 35.4 % — ABNORMAL LOW (ref 36.0–46.0)
Hemoglobin: 10.9 g/dL — ABNORMAL LOW (ref 12.0–15.0)
Immature Granulocytes: 0 %
Lymphocytes Relative: 8 %
Lymphs Abs: 0.9 10*3/uL (ref 0.7–4.0)
MCH: 25.8 pg — ABNORMAL LOW (ref 26.0–34.0)
MCHC: 30.8 g/dL (ref 30.0–36.0)
MCV: 83.7 fL (ref 80.0–100.0)
Monocytes Absolute: 0.5 10*3/uL (ref 0.1–1.0)
Monocytes Relative: 5 %
Neutro Abs: 9.1 10*3/uL — ABNORMAL HIGH (ref 1.7–7.7)
Neutrophils Relative %: 83 %
Platelets: 183 10*3/uL (ref 150–400)
RBC: 4.23 MIL/uL (ref 3.87–5.11)
RDW: 15.1 % (ref 11.5–15.5)
WBC: 11 10*3/uL — ABNORMAL HIGH (ref 4.0–10.5)
nRBC: 0 % (ref 0.0–0.2)

## 2023-02-18 LAB — BRAIN NATRIURETIC PEPTIDE: B Natriuretic Peptide: 270 pg/mL — ABNORMAL HIGH (ref 0.0–100.0)

## 2023-02-18 LAB — COMPREHENSIVE METABOLIC PANEL
ALT: 9 U/L (ref 0–44)
AST: 16 U/L (ref 15–41)
Albumin: 3 g/dL — ABNORMAL LOW (ref 3.5–5.0)
Alkaline Phosphatase: 30 U/L — ABNORMAL LOW (ref 38–126)
Anion gap: 8 (ref 5–15)
BUN: 21 mg/dL (ref 8–23)
CO2: 28 mmol/L (ref 22–32)
Calcium: 9.5 mg/dL (ref 8.9–10.3)
Chloride: 101 mmol/L (ref 98–111)
Creatinine, Ser: 0.75 mg/dL (ref 0.44–1.00)
GFR, Estimated: >60 mL/min (ref >60)
Glucose, Bld: 197 mg/dL — ABNORMAL HIGH (ref 70–99)
Potassium: 3.5 mmol/L (ref 3.5–5.1)
Sodium: 137 mmol/L (ref 135–145)
Total Bilirubin: 0.8 mg/dL (ref 0.3–1.2)
Total Protein: 6.1 g/dL — ABNORMAL LOW (ref 6.5–8.1)

## 2023-02-18 LAB — CK: Total CK: 19 U/L — ABNORMAL LOW (ref 38–234)

## 2023-02-18 LAB — CBG MONITORING, ED: Glucose-Capillary: 151 mg/dL — ABNORMAL HIGH (ref 70–99)

## 2023-02-18 LAB — VITAMIN B12: Vitamin B-12: 725 pg/mL (ref 180–914)

## 2023-02-18 LAB — TSH: TSH: 1.22 u[IU]/mL (ref 0.350–4.500)

## 2023-02-18 LAB — GLUCOSE, CAPILLARY
Glucose-Capillary: 123 mg/dL — ABNORMAL HIGH (ref 70–99)
Glucose-Capillary: 198 mg/dL — ABNORMAL HIGH (ref 70–99)

## 2023-02-18 LAB — MAGNESIUM
Magnesium: 2.2 mg/dL (ref 1.7–2.4)
Magnesium: 2 mg/dL (ref 1.7–2.4)

## 2023-02-18 LAB — PROCALCITONIN: Procalcitonin: <0.1 ng/mL

## 2023-02-18 MED ORDER — METHOCARBAMOL 500 MG PO TABS
500.0000 mg | ORAL_TABLET | Freq: Four times a day (QID) | ORAL | Status: DC | PRN
  Filled 2023-02-18: qty 1

## 2023-02-18 MED ORDER — APIXABAN 5 MG PO TABS
5.0000 mg | ORAL_TABLET | Freq: Two times a day (BID) | ORAL | Status: DC
  Administered 2023-02-18 – 2023-02-19 (×3): 5 mg via ORAL
  Filled 2023-02-18 (×3): qty 1

## 2023-02-18 MED ORDER — INSULIN ASPART 100 UNIT/ML IJ SOLN
0.0000 [IU] | Freq: Three times a day (TID) | INTRAMUSCULAR | Status: DC
  Administered 2023-02-18 (×2): 1 [IU] via SUBCUTANEOUS
  Filled 2023-02-18: qty 0.06

## 2023-02-18 MED ORDER — SODIUM CHLORIDE 0.9 % IV SOLN
500.0000 mg | INTRAVENOUS | Status: DC
  Administered 2023-02-18 – 2023-02-19 (×2): 500 mg via INTRAVENOUS
  Filled 2023-02-18 (×2): qty 5

## 2023-02-18 MED ORDER — GABAPENTIN 100 MG PO CAPS
100.0000 mg | ORAL_CAPSULE | Freq: Two times a day (BID) | ORAL | Status: DC
  Administered 2023-02-18 – 2023-02-19 (×3): 100 mg via ORAL
  Filled 2023-02-18 (×3): qty 1

## 2023-02-18 MED ORDER — LACTATED RINGERS IV SOLN: INTRAVENOUS | Status: DC

## 2023-02-18 MED ORDER — SODIUM CHLORIDE 0.9 % IV SOLN
1.0000 g | INTRAVENOUS | Status: DC
  Administered 2023-02-18 – 2023-02-19 (×2): 1 g via INTRAVENOUS
  Filled 2023-02-18 (×2): qty 10

## 2023-02-18 NOTE — ED Notes (Signed)
Assumed care of patient. Patient resting comfortably in bed with no signs of acute distress noted. Waiting on ready hospital bed. Family at bedside.

## 2023-02-18 NOTE — Progress Notes (Addendum)
PROGRESS NOTE    Lindsey Ross  ZOX:[id number] DOB: [birth year] DOA: 02/17/2023 PCP: Chilton Greathouse, MD   Brief Narrative:  89 year old female with history of DVT/PE on Eliquis, high-grade heart block status post pacemaker placement in May 2019, diabetes mellitus type 2 with with diabetic peripheral neuropathy, chronic diastolic heart failure and recent outpatient treatment with oral Macrobid for possible UTI presented with generalized weakness.  On presentation temperature max 100.9, she was tachycardic and slightly tachypneic, initially hypoxic on room air requiring 2 L oxygen via nasal cannula.  WBC of 17.6; UA was negative.  COVID-19/influenza/RSV PCR negative.  Chest x-ray was negative for acute cardiopulmonary process.  She was started on IV fluids and antibiotics.  Assessment & Plan:   Generalized weakness Possible dehydration -Questionable cause.  May be related to dehydration. -Continue IV fluids.  PT eval -B12 level normal.  SIRS criteria -Presented with fever, leukocytosis, dehydration and weakness but no evidence of any infection including negative UA, chest x-ray and subsequent CT of chest.  Procalcitonin negative as well. -WBCs improving.  Currently on broad-spectrum antibiotics which will be continued for at least 1 more day.  Follow cultures. -Of note, patient was recently on Macrobid for possible UTI and still is having some urinary symptoms including burning.  Leukocytosis -WBCs improving to 11.  Repeat a.m. labs  History of DVT/PE -Continue Eliquis  Diabetes mellitus type 2 with hyperglycemia Diabetic neuropathy -Continue CBGs with SSI -Continue gabapentin  Chronic diastolic heart failure -Most recent echo from June 2021 had shown EF of 55 to 60% with grade 1 diastolic dysfunction.  Currently looks euvolemic.  Lasix on hold.  Strict input and output.  Daily weights.  Physical deconditioning -PT eval  DVT prophylaxis: Eliquis Code Status: Full  family  Communication: Daughters: at bedside and on phone Disposition Plan: Status is: Inpatient Remains inpatient appropriate because: Of severity of illness  Consultants: None  Procedures: None  Antimicrobials:  Anti-infectives (From admission, onward)    Start     Dose/Rate Route Frequency Ordered Stop   02/18/23 0800  cefTRIAXone (ROCEPHIN) 1 g in sodium chloride 0.9 % 100 mL IVPB        1 g 200 mL/hr over 30 Minutes Intravenous Every 24 hours 02/18/23 0034     02/18/23 0045  azithromycin (ZITHROMAX) 500 mg in sodium chloride 0.9 % 250 mL IVPB        500 mg 250 mL/hr over 60 Minutes Intravenous Every 24 hours 02/18/23 0034     02/17/23 2300  ceFEPIme (MAXIPIME) 2 g in sodium chloride 0.9 % 100 mL IVPB        2 g 200 mL/hr over 30 Minutes Intravenous  Once 02/17/23 2246 02/17/23 2350   02/17/23 2300  metroNIDAZOLE (FLAGYL) IVPB 500 mg        500 mg 100 mL/hr over 60 Minutes Intravenous  Once 02/17/23 2246 02/18/23 0130   02/17/23 2300  vancomycin (VANCOCIN) IVPB 1000 mg/200 mL premix        1,000 mg 200 mL/hr over 60 Minutes Intravenous  Once 02/17/23 2246 02/18/23 0247         Subjective: Patient seen and examined at bedside.  Feels weak.  Complains of some burning with urination.  Complains of pain in her flanks when she moves.  Has not gotten out of her bed yet.  Denies any current nausea or vomiting.  Objective: Vitals:   02/18/23 0900 02/18/23 0930 02/18/23 1000 02/18/23 1030  BP: (!) 153/65 123/75 126/63 115/72  Pulse: 60 70 71 69  Resp: 17 19 16  (!) 28  Temp:      TempSrc:      SpO2: 100% 94% 94% 94%  Weight:      Height:        Intake/Output Summary (Last 24 hours) at 02/18/2023 1054 Last data filed at 02/18/2023 0514 Gross per 24 hour  Intake 1150 ml  Output --  Net 1150 ml   Filed Weights   02/17/23 2153  Weight: 65.8 kg    Examination:  General exam: Appears calm and comfortable.  Elderly female lying in bed.  No distress. Respiratory system:  Bilateral decreased breath sounds at bases with intermittent tachypnea Cardiovascular system: S1 & S2 heard, Rate controlled Gastrointestinal system: Abdomen is nondistended, soft and nontender. Normal bowel sounds heard. Extremities: No cyanosis, clubbing, edema  Central nervous system: Alert and oriented. No focal neurological deficits. Moving extremities Skin: No rashes, lesions or ulcers Psychiatry: Judgement and insight appear normal. Mood & affect appropriate.     Data Reviewed: I have personally reviewed following labs and imaging studies  CBC: Recent Labs  Lab 02/17/23 2210 02/18/23 0530  WBC 17.6* 11.0*  NEUTROABS 16.2* 9.1*  HGB 12.9 10.9*  HCT 42.1 35.4*  MCV 82.7 83.7  PLT 242 183   Basic Metabolic Panel: Recent Labs  Lab 02/17/23 2210 02/18/23 0530  NA 135 137  K 3.8 3.5  CL 97* 101  CO2 26 28  GLUCOSE 180* 197*  BUN 25* 21  CREATININE 0.86 0.75  CALCIUM 10.1 9.5  MG 2.2 2.0   GFR: Estimated Creatinine Clearance: 39.2 mL/min (by C-G formula based on SCr of 0.75 mg/dL). Liver Function Tests: Recent Labs  Lab 02/17/23 2210 02/18/23 0530  AST 18 16  ALT 11 9  ALKPHOS 42 30*  BILITOT 0.9 0.8  PROT 7.6 6.1*  ALBUMIN 4.0 3.0*   No results for input(s): "LIPASE", "AMYLASE" in the last 168 hours. No results for input(s): "AMMONIA" in the last 168 hours. Coagulation Profile: Recent Labs  Lab 02/17/23 2245  INR 1.3*   Cardiac Enzymes: Recent Labs  Lab 02/17/23 0100  CKTOTAL 19*   BNP (last 3 results) No results for input(s): "PROBNP" in the last 8760 hours. HbA1C: No results for input(s): "HGBA1C" in the last 72 hours. CBG: Recent Labs  Lab 02/18/23 0903  GLUCAP 151*   Lipid Profile: No results for input(s): "CHOL", "HDL", "LDLCALC", "TRIG", "CHOLHDL", "LDLDIRECT" in the last 72 hours. Thyroid Function Tests: Recent Labs    02/18/23 0110  TSH 1.220   Anemia Panel: Recent Labs    02/18/23 0110  VITAMINB12 725   Sepsis  Labs: Recent Labs  Lab 02/17/23 0100 02/17/23 2245  PROCALCITON <0.10  --   LATICACIDVEN  --  1.6    Recent Results (from the past 240 hour(s))  Resp panel by RT-PCR (RSV, Flu A&B, Covid) Anterior Nasal Swab     Status: None   Collection Time: 02/17/23 10:45 PM   Specimen: Anterior Nasal Swab  Result Value Ref Range Status   SARS Coronavirus 2 by RT PCR NEGATIVE NEGATIVE Final    Comment: (NOTE) SARS-CoV-2 target nucleic acids are NOT DETECTED.  The SARS-CoV-2 RNA is generally detectable in upper respiratory specimens during the acute phase of infection. The lowest concentration of SARS-CoV-2 viral copies this assay can detect is 138 copies/mL. A negative result does not preclude SARS-Cov-2 infection and should not be used as the sole basis for treatment or other patient  management decisions. A negative result may occur with  improper specimen collection/handling, submission of specimen other than nasopharyngeal swab, presence of viral mutation(s) within the areas targeted by this assay, and inadequate number of viral copies(<138 copies/mL). A negative result must be combined with clinical observations, patient history, and epidemiological information. The expected result is Negative.  Fact Sheet for Patients:  BloggerCourse.com  Fact Sheet for Healthcare Providers:  SeriousBroker.it  This test is no t yet approved or cleared by the [place] FDA and  has been authorized for detection and/or diagnosis of SARS-CoV-2 by FDA under an Emergency Use Authorization (EUA). This EUA will remain  in effect (meaning this test can be used) for the duration of the COVID-19 declaration under Section 564(b)(1) of the Act, 21 U.S.C.section 360bbb-3(b)(1), unless the authorization is terminated  or revoked sooner.       Influenza A by PCR NEGATIVE NEGATIVE Final   Influenza B by PCR NEGATIVE NEGATIVE Final    Comment: (NOTE) The  Xpert Xpress SARS-CoV-2/FLU/RSV plus assay is intended as an aid in the diagnosis of influenza from Nasopharyngeal swab specimens and should not be used as a sole basis for treatment. Nasal washings and aspirates are unacceptable for Xpert Xpress SARS-CoV-2/FLU/RSV testing.  Fact Sheet for Patients: BloggerCourse.com  Fact Sheet for Healthcare Providers: SeriousBroker.it  This test is not yet approved or cleared by the [place] FDA and has been authorized for detection and/or diagnosis of SARS-CoV-2 by FDA under an Emergency Use Authorization (EUA). This EUA will remain in effect (meaning this test can be used) for the duration of the COVID-19 declaration under Section 564(b)(1) of the Act, 21 U.S.C. section 360bbb-3(b)(1), unless the authorization is terminated or revoked.     Resp Syncytial Virus by PCR NEGATIVE NEGATIVE Final    Comment: (NOTE) Fact Sheet for Patients: BloggerCourse.com  Fact Sheet for Healthcare Providers: SeriousBroker.it  This test is not yet approved or cleared by the [place] FDA and has been authorized for detection and/or diagnosis of SARS-CoV-2 by FDA under an Emergency Use Authorization (EUA). This EUA will remain in effect (meaning this test can be used) for the duration of the COVID-19 declaration under Section 564(b)(1) of the Act, 21 U.S.C. section 360bbb-3(b)(1), unless the authorization is terminated or revoked.  Performed at [facility], [address] [place], [place] [address]          Radiology Studies: CT CHEST WO CONTRAST  Result Date: 02/18/2023 CLINICAL DATA:  Respiratory illness, leukocytosis, fever EXAM: CT CHEST WITHOUT CONTRAST TECHNIQUE: Multidetector CT imaging of the chest was performed following the standard protocol without IV contrast. RADIATION DOSE REDUCTION: This exam was performed  according to the departmental dose-optimization program which includes automated exposure control, adjustment of the mA and/or kV according to patient size and/or use of iterative reconstruction technique. COMPARISON:  08/25/2022 FINDINGS: Cardiovascular: Moderate coronary artery calcification. Long segment stenting of the left anterior descending coronary artery noted. Mild global cardiomegaly. Left subclavian dual lead pacemaker in place with leads within the right atrium and right ventricle. No pericardial effusion. Central pulmonary arteries are enlarged in keeping with changes of pulmonary arterial hypertension, unchanged. Moderate atherosclerotic calcification within the thoracic aorta. Mild dilation of the ascending thoracic aorta measuring 4.1 cm in greatest dimension. Descending thoracic aorta is of normal caliber. Mediastinum/Nodes: 14 mm nodule within the left thyroid gland is stable since prior examination and unlikely of clinical significance in a patient of this age. No follow-up imaging recommended. No  pathologic thoracic adenopathy. Esophagus unremarkable. Large hiatal hernia. Lungs/Pleura: There is again seen subpleural reticulation, traction bronchiectasis, and mild honeycombing, best appreciated within the left lung base, demonstrating a basilar gradient in keeping with changes of UIP. Architectural changes and volume loss appears slightly progressive at the lung bases. Previously noted superimposed inflammatory infiltrates have resolved. No superimposed inflammatory infiltrate or focal consolidation identified. No discrete focal pulmonary nodules. No pneumothorax or pleural effusion. Upper Abdomen: No acute abnormality. Musculoskeletal: Osseous structures are age-appropriate. Multiple remote appearing compression deformities are seen within the visualized thoracolumbar spine. No acute bone abnormality. IMPRESSION: 1. No acute intrathoracic pathology identified. 2. Moderate coronary artery  calcification. Mild global cardiomegaly. Morphologic changes in keeping with pulmonary arterial hypertension. 3. Stable 4.1 cm ascending thoracic aortic aneurysm. Recommend annual imaging followup by CTA or MRA. This recommendation follows 2010 ACCF/AHA/AATS/ACR/ASA/SCA/SCAI/SIR/STS/SVM Guidelines for the Diagnosis and Management of Patients with Thoracic Aortic Disease. Circulation. 2010; 121: W098-J191. Aortic aneurysm NOS (ICD10-I71.9) 4. Stable large hiatal hernia. 5. Stable 14 mm nodule within the left thyroid gland. No follow-up imaging recommended. 6. Multiple remote appearing compression deformities within the visualized thoracolumbar spine. No acute bone abnormality. Aortic Atherosclerosis (ICD10-I70.0). Electronically Signed   By: Helyn Numbers M.D.   On: 02/18/2023 01:59   DG Chest Portable 1 View  Result Date: 02/17/2023 CLINICAL DATA:  Hypoxia EXAM: PORTABLE CHEST 1 VIEW COMPARISON:  08/25/2022 FINDINGS: Lung volumes are small. Large hiatal hernia again noted. Associated left basilar interstitial changes and atelectasis is unchanged, better appreciated on CT examination of 08/25/2022. No pneumothorax or pleural effusion. Stable cardiomegaly. Left subclavian dual lead pacemaker is unchanged. No acute bone abnormality. IMPRESSION: 1. Pulmonary hypoinflation. 2. Large hiatal hernia. Electronically Signed   By: Helyn Numbers M.D.   On: 02/17/2023 22:56        Scheduled Meds:  apixaban  5 mg Oral BID   gabapentin  100 mg Oral BID   insulin aspart  0-6 Units Subcutaneous TID WC   Continuous Infusions:  azithromycin Stopped (02/18/23 0514)   cefTRIAXone (ROCEPHIN)  IV Stopped (02/18/23 4782)   lactated ringers 100 mL/hr at 02/18/23 0035          Glade Lloyd, MD [facility] 02/18/2023, 10:54 AM

## 2023-02-18 NOTE — ED Notes (Signed)
ED TO INPATIENT HANDOFF REPORT  Name/Age/Gender Lindsey Ross 89 y.o. female  Code Status    Code Status Orders  (From admission, onward)           Start     Ordered   02/17/23 2356  Full code  Continuous       Question:  By:  Answer:  Consent: discussion documented in EHR   02/17/23 2356           Code Status History     Date Active Date Inactive Code Status Order ID Comments User Context   07/07/2017 1749 07/08/2017 1425 Full Code [id number]  Regan Lemming, MD Inpatient   06/24/2017 2141 06/28/2017 1549 Full Code [id number]  Eduard Clos, MD Inpatient   03/25/2015 1755 03/28/2015 1654 Full Code [id number]  Kathryne Hitch, MD Inpatient      Advance Directive Documentation    Flowsheet Row Most Recent Value  Type of Advance Directive Living will, Healthcare Power of Attorney  Pre-existing out of facility DNR order (yellow form or pink MOST form) --  "MOST" Form in Place? --       Home/SNF/Other Home  Chief Complaint Generalized weakness [R53.1]  Level of Care/Admitting Diagnosis ED Disposition     ED Disposition  Admit   Condition  --   Comment  Hospital Area: [facility] [[id number]]  Level of Care: Telemetry [5]  Admit to tele based on following criteria: Monitor for Ischemic changes  May admit patient to [facility] or [facility] if equivalent level of care is available:: No  Covid Evaluation: Asymptomatic - no recent exposure (last 10 days) testing not required  Diagnosis: Generalized weakness [086578]  Admitting Physician: Angie Fava [[id number]]  Attending Physician: Angie Fava [[id number]]  Certification:: I certify this patient will need inpatient services for at least 2 midnights  Estimated Length of Stay: 2          Medical History Past Medical History:  Diagnosis Date   Acute bilateral low back pain without sciatica 08/31/2016   Acute deep vein thrombosis (DVT) of left tibial vein (HCC)  06/26/2017   Acute pain of right wrist 08/31/2016   Aortic atherosclerosis (HCC) 06/25/2017   Arthritis    "right ankle; maybe some in her back" (07/07/2017)   Bradyarrhythmia 06/23/2017   Burning sensation of feet    Bilateral feet   Closed fracture distal radius and ulna, right, initial encounter 08/31/2016   DVT (deep venous thrombosis) (HCC) 06/24/2017   LLE   Essential hypertension 06/23/2017   Frequent urination    Heart block AV second degree 06/24/2017   Hypertension    Osteoarthritis of right hip 03/25/2015   Pacemaker 01/13/2018   Presence of permanent cardiac pacemaker    Pulmonary embolism (HCC) 06/24/2017   Pulmonary hypertension, unspecified (HCC) 12/28/2017   70 mmHg by echo in October 2018   Shortness of breath dyspnea    with exertion   SOB (shortness of breath) on exertion 06/23/2017   Status post total replacement of right hip 03/25/2015    Allergies Allergies  Allergen Reactions   Alendronate Sodium Other (See Comments)    IV Location/Drains/Wounds Patient Lines/Drains/Airways Status     Active Line/Drains/Airways     Name Placement date Placement time Site Days   Peripheral IV 02/17/23 20 G 1" Left Antecubital 02/17/23  2210  Antecubital  1   Peripheral IV 02/17/23 20 G 1" Right Antecubital 02/17/23  2315  Antecubital  1            Labs/Imaging Results for orders placed or performed during the hospital encounter of 02/17/23 (from the past 48 hour(s))  CK     Status: Abnormal   Collection Time: 02/17/23  1:00 AM  Result Value Ref Range   Total CK 19 (L) 38 - 234 U/L    Comment: Performed at [facility], [address], [place], [place] [address]  Procalcitonin     Status: None   Collection Time: 02/17/23  1:00 AM  Result Value Ref Range   Procalcitonin <0.10 ng/mL    Comment:        Interpretation: PCT (Procalcitonin) <= 0.5 ng/mL: Systemic infection (sepsis) is not likely. Local bacterial infection is possible. (NOTE)        Sepsis PCT Algorithm           Lower Respiratory Tract                                      Infection PCT Algorithm    ----------------------------     ----------------------------         PCT < 0.25 ng/mL                PCT < 0.10 ng/mL          Strongly encourage             Strongly discourage   discontinuation of antibiotics    initiation of antibiotics    ----------------------------     -----------------------------       PCT 0.25 - 0.50 ng/mL            PCT 0.10 - 0.25 ng/mL               OR       >80% decrease in PCT            Discourage initiation of                                            antibiotics      Encourage discontinuation           of antibiotics    ----------------------------     -----------------------------         PCT >= 0.50 ng/mL              PCT 0.26 - 0.50 ng/mL               AND        <80% decrease in PCT             Encourage initiation of                                             antibiotics       Encourage continuation           of antibiotics    ----------------------------     -----------------------------        PCT >= 0.50 ng/mL                  PCT > 0.50 ng/mL  AND         increase in PCT                  Strongly encourage                                      initiation of antibiotics    Strongly encourage escalation           of antibiotics                                     -----------------------------                                           PCT <= 0.25 ng/mL                                                 OR                                        > 80% decrease in PCT                                      Discontinue / Do not initiate                                             antibiotics  Performed at [facility], [address], [place], [place] [address]   CBC with Differential     Status: Abnormal   Collection Time: 02/17/23 10:10 PM  Result Value Ref Range   WBC 17.6 (H) 4.0 - 10.5  K/uL   RBC 5.09 3.87 - 5.11 MIL/uL   Hemoglobin 12.9 12.0 - 15.0 g/dL   HCT 40.9 81.1 - 91.4 %   MCV 82.7 80.0 - 100.0 fL   MCH 25.3 (L) 26.0 - 34.0 pg   MCHC 30.6 30.0 - 36.0 g/dL   RDW 78.2 95.6 - 21.3 %   Platelets 242 150 - 400 K/uL   nRBC 0.0 0.0 - 0.2 %   Neutrophils Relative % 91 %   Neutro Abs 16.2 (H) 1.7 - 7.7 K/uL   Lymphocytes Relative 3 %   Lymphs Abs 0.6 (L) 0.7 - 4.0 K/uL   Monocytes Relative 4 %   Monocytes Absolute 0.6 0.1 - 1.0 K/uL   Eosinophils Relative 1 %   Eosinophils Absolute 0.1 0.0 - 0.5 K/uL   Basophils Relative 0 %   Basophils Absolute 0.1 0.0 - 0.1 K/uL   Immature Granulocytes 1 %   Abs Immature Granulocytes 0.10 (H) 0.00 - 0.07 K/uL    Comment: Performed at [facility], [address] [place], [place] [address]  Comprehensive metabolic panel     Status: Abnormal   Collection Time: 02/17/23 10:10  PM  Result Value Ref Range   Sodium 135 135 - 145 mmol/L   Potassium 3.8 3.5 - 5.1 mmol/L   Chloride 97 (L) 98 - 111 mmol/L   CO2 26 22 - 32 mmol/L   Glucose, Bld 180 (H) 70 - 99 mg/dL    Comment: Glucose reference range applies only to samples taken after fasting for at least 8 hours.   BUN 25 (H) 8 - 23 mg/dL   Creatinine, Ser 1.61 0.44 - 1.00 mg/dL   Calcium 09.6 8.9 - 04.5 mg/dL   Total Protein 7.6 6.5 - 8.1 g/dL   Albumin 4.0 3.5 - 5.0 g/dL   AST 18 15 - 41 U/L   ALT 11 0 - 44 U/L   Alkaline Phosphatase 42 38 - 126 U/L   Total Bilirubin 0.9 0.3 - 1.2 mg/dL   GFR, Estimated >40 >98 mL/min    Comment: (NOTE) Calculated using the CKD-EPI Creatinine Equation (2021)    Anion gap 12 5 - 15    Comment: Performed at [facility], [address] [place], [place] [address]  Magnesium     Status: None   Collection Time: 02/17/23 10:10 PM  Result Value Ref Range   Magnesium 2.2 1.7 - 2.4 mg/dL    Comment: Performed at [facility], [address], [place], [place] [address]  Urinalysis,  Routine w reflex microscopic -Urine, Clean Catch     Status: Abnormal   Collection Time: 02/17/23 10:38 PM  Result Value Ref Range   Color, Urine YELLOW YELLOW   APPearance HAZY (A) CLEAR   Specific Gravity, Urine 1.024 1.005 - 1.030   pH 5.0 5.0 - 8.0   Glucose, UA >=500 (A) NEGATIVE mg/dL   Hgb urine dipstick SMALL (A) NEGATIVE   Bilirubin Urine NEGATIVE NEGATIVE   Ketones, ur 20 (A) NEGATIVE mg/dL   Protein, ur NEGATIVE NEGATIVE mg/dL   Nitrite NEGATIVE NEGATIVE   Leukocytes,Ua NEGATIVE NEGATIVE   RBC / HPF 0-5 0 - 5 RBC/hpf   WBC, UA 0-5 0 - 5 WBC/hpf   Bacteria, UA NONE SEEN NONE SEEN   Squamous Epithelial / HPF 0-5 0 - 5 /HPF    Comment: Performed at [facility], [address] [place], [place] [address]  Resp panel by RT-PCR (RSV, Flu A&B, Covid) Anterior Nasal Swab     Status: None   Collection Time: 02/17/23 10:45 PM   Specimen: Anterior Nasal Swab  Result Value Ref Range   SARS Coronavirus 2 by RT PCR NEGATIVE NEGATIVE    Comment: (NOTE) SARS-CoV-2 target nucleic acids are NOT DETECTED.  The SARS-CoV-2 RNA is generally detectable in upper respiratory specimens during the acute phase of infection. The lowest concentration of SARS-CoV-2 viral copies this assay can detect is 138 copies/mL. A negative result does not preclude SARS-Cov-2 infection and should not be used as the sole basis for treatment or other patient management decisions. A negative result may occur with  improper specimen collection/handling, submission of specimen other than nasopharyngeal swab, presence of viral mutation(s) within the areas targeted by this assay, and inadequate number of viral copies(<138 copies/mL). A negative result must be combined with clinical observations, patient history, and epidemiological information. The expected result is Negative.  Fact Sheet for Patients:  BloggerCourse.com  Fact Sheet for Healthcare Providers:   SeriousBroker.it  This test is no t yet approved or cleared by the [place] FDA and  has been authorized for detection and/or diagnosis  of SARS-CoV-2 by FDA under an Emergency Use Authorization (EUA). This EUA will remain  in effect (meaning this test can be used) for the duration of the COVID-19 declaration under Section 564(b)(1) of the Act, 21 U.S.C.section 360bbb-3(b)(1), unless the authorization is terminated  or revoked sooner.       Influenza A by PCR NEGATIVE NEGATIVE   Influenza B by PCR NEGATIVE NEGATIVE    Comment: (NOTE) The Xpert Xpress SARS-CoV-2/FLU/RSV plus assay is intended as an aid in the diagnosis of influenza from Nasopharyngeal swab specimens and should not be used as a sole basis for treatment. Nasal washings and aspirates are unacceptable for Xpert Xpress SARS-CoV-2/FLU/RSV testing.  Fact Sheet for Patients: BloggerCourse.com  Fact Sheet for Healthcare Providers: SeriousBroker.it  This test is not yet approved or cleared by the [place] FDA and has been authorized for detection and/or diagnosis of SARS-CoV-2 by FDA under an Emergency Use Authorization (EUA). This EUA will remain in effect (meaning this test can be used) for the duration of the COVID-19 declaration under Section 564(b)(1) of the Act, 21 U.S.C. section 360bbb-3(b)(1), unless the authorization is terminated or revoked.     Resp Syncytial Virus by PCR NEGATIVE NEGATIVE    Comment: (NOTE) Fact Sheet for Patients: BloggerCourse.com  Fact Sheet for Healthcare Providers: SeriousBroker.it  This test is not yet approved or cleared by the [place] FDA and has been authorized for detection and/or diagnosis of SARS-CoV-2 by FDA under an Emergency Use Authorization (EUA). This EUA will remain in effect (meaning this test can be used) for the duration of  the COVID-19 declaration under Section 564(b)(1) of the Act, 21 U.S.C. section 360bbb-3(b)(1), unless the authorization is terminated or revoked.  Performed at [facility], [address], [place], [place] [address]   Lactic acid, plasma     Status: None   Collection Time: 02/17/23 10:45 PM  Result Value Ref Range   Lactic Acid, Venous 1.6 0.5 - 1.9 mmol/L    Comment: Performed at [facility], [address], [place], [place] [address]  Protime-INR     Status: Abnormal   Collection Time: 02/17/23 10:45 PM  Result Value Ref Range   Prothrombin Time 16.2 (H) 11.4 - 15.2 seconds   INR 1.3 (H) 0.8 - 1.2    Comment: (NOTE) INR goal varies based on device and disease states. Performed at [facility], [address] [place], [place] [address]   APTT     Status: None   Collection Time: 02/17/23 10:45 PM  Result Value Ref Range   aPTT 30 24 - 36 seconds    Comment: Performed at [facility], [address], [place], [place] [address]  Vitamin B12     Status: None   Collection Time: 02/18/23  1:10 AM  Result Value Ref Range   Vitamin B-12 725 180 - 914 pg/mL    Comment: (NOTE) This assay is not validated for testing neonatal or myeloproliferative syndrome specimens for Vitamin B12 levels. Performed at [facility], [address] [place], [place] [address]   TSH     Status: None   Collection Time: 02/18/23  1:10 AM  Result Value Ref Range   TSH 1.220 0.350 - 4.500 uIU/mL    Comment: Performed by a 3rd Generation assay with a functional sensitivity of <=0.01 uIU/mL. Performed at [facility], [address] [place], [place] [address]   Brain natriuretic peptide  Status: Abnormal   Collection Time: 02/18/23  1:10 AM  Result Value Ref Range   B Natriuretic Peptide 270.0 (H) 0.0 - 100.0 pg/mL    Comment: Performed at [facility], [address] [place], [place] [address]  CBC with Differential/Platelet     Status: Abnormal   Collection Time: 02/18/23  5:30 AM  Result Value Ref Range   WBC 11.0 (H) 4.0 - 10.5 K/uL   RBC 4.23 3.87 - 5.11 MIL/uL   Hemoglobin 10.9 (L) 12.0 - 15.0 g/dL   HCT 95.6 (L) 21.3 - 08.6 %   MCV 83.7 80.0 - 100.0 fL   MCH 25.8 (L) 26.0 - 34.0 pg   MCHC 30.8 30.0 - 36.0 g/dL   RDW 57.8 46.9 - 62.9 %   Platelets 183 150 - 400 K/uL   nRBC 0.0 0.0 - 0.2 %   Neutrophils Relative % 83 %   Neutro Abs 9.1 (H) 1.7 - 7.7 K/uL   Lymphocytes Relative 8 %   Lymphs Abs 0.9 0.7 - 4.0 K/uL   Monocytes Relative 5 %   Monocytes Absolute 0.5 0.1 - 1.0 K/uL   Eosinophils Relative 4 %   Eosinophils Absolute 0.5 0.0 - 0.5 K/uL   Basophils Relative 0 %   Basophils Absolute 0.0 0.0 - 0.1 K/uL   Immature Granulocytes 0 %   Abs Immature Granulocytes 0.03 0.00 - 0.07 K/uL    Comment: Performed at [facility], [address], [place], [place] [address]  Comprehensive metabolic panel     Status: Abnormal   Collection Time: 02/18/23  5:30 AM  Result Value Ref Range   Sodium 137 135 - 145 mmol/L   Potassium 3.5 3.5 - 5.1 mmol/L   Chloride 101 98 - 111 mmol/L   CO2 28 22 - 32 mmol/L   Glucose, Bld 197 (H) 70 - 99 mg/dL    Comment: Glucose reference range applies only to samples taken after fasting for at least 8 hours.   BUN 21 8 - 23 mg/dL   Creatinine, Ser 3.24 0.44 - 1.00 mg/dL   Calcium 9.5 8.9 - 40.1 mg/dL   Total Protein 6.1 (L) 6.5 - 8.1 g/dL   Albumin 3.0 (L) 3.5 - 5.0 g/dL   AST 16 15 - 41 U/L   ALT 9 0 - 44 U/L   Alkaline Phosphatase 30 (L) 38 - 126 U/L   Total Bilirubin 0.8 0.3 - 1.2 mg/dL   GFR, Estimated >02 >72 mL/min    Comment: (NOTE) Calculated using the CKD-EPI Creatinine Equation (2021)    Anion gap 8 5 - 15    Comment: Performed at [facility], [address], [place], [place] [address]  Magnesium     Status: None   Collection Time: 02/18/23  5:30  AM  Result Value Ref Range   Magnesium 2.0 1.7 - 2.4 mg/dL    Comment: Performed at [facility], [address] [place], [place] [address]  CBG monitoring, ED     Status: Abnormal   Collection Time: 02/18/23  9:03 AM  Result Value Ref Range   Glucose-Capillary 151 (H) 70 - 99 mg/dL    Comment: Glucose reference range applies only to samples taken after fasting for at least 8 hours.   CT CHEST WO CONTRAST  Result Date: 02/18/2023 CLINICAL DATA:  Respiratory illness, leukocytosis, fever EXAM: CT CHEST WITHOUT CONTRAST TECHNIQUE: Multidetector CT imaging of the chest was performed following the  standard protocol without IV contrast. RADIATION DOSE REDUCTION: This exam was performed according to the departmental dose-optimization program which includes automated exposure control, adjustment of the mA and/or kV according to patient size and/or use of iterative reconstruction technique. COMPARISON:  08/25/2022 FINDINGS: Cardiovascular: Moderate coronary artery calcification. Long segment stenting of the left anterior descending coronary artery noted. Mild global cardiomegaly. Left subclavian dual lead pacemaker in place with leads within the right atrium and right ventricle. No pericardial effusion. Central pulmonary arteries are enlarged in keeping with changes of pulmonary arterial hypertension, unchanged. Moderate atherosclerotic calcification within the thoracic aorta. Mild dilation of the ascending thoracic aorta measuring 4.1 cm in greatest dimension. Descending thoracic aorta is of normal caliber. Mediastinum/Nodes: 14 mm nodule within the left thyroid gland is stable since prior examination and unlikely of clinical significance in a patient of this age. No follow-up imaging recommended. No pathologic thoracic adenopathy. Esophagus unremarkable. Large hiatal hernia. Lungs/Pleura: There is again seen subpleural reticulation, traction bronchiectasis, and mild honeycombing, best  appreciated within the left lung base, demonstrating a basilar gradient in keeping with changes of UIP. Architectural changes and volume loss appears slightly progressive at the lung bases. Previously noted superimposed inflammatory infiltrates have resolved. No superimposed inflammatory infiltrate or focal consolidation identified. No discrete focal pulmonary nodules. No pneumothorax or pleural effusion. Upper Abdomen: No acute abnormality. Musculoskeletal: Osseous structures are age-appropriate. Multiple remote appearing compression deformities are seen within the visualized thoracolumbar spine. No acute bone abnormality. IMPRESSION: 1. No acute intrathoracic pathology identified. 2. Moderate coronary artery calcification. Mild global cardiomegaly. Morphologic changes in keeping with pulmonary arterial hypertension. 3. Stable 4.1 cm ascending thoracic aortic aneurysm. Recommend annual imaging followup by CTA or MRA. This recommendation follows 2010 ACCF/AHA/AATS/ACR/ASA/SCA/SCAI/SIR/STS/SVM Guidelines for the Diagnosis and Management of Patients with Thoracic Aortic Disease. Circulation. 2010; 121: F621-H086. Aortic aneurysm NOS (ICD10-I71.9) 4. Stable large hiatal hernia. 5. Stable 14 mm nodule within the left thyroid gland. No follow-up imaging recommended. 6. Multiple remote appearing compression deformities within the visualized thoracolumbar spine. No acute bone abnormality. Aortic Atherosclerosis (ICD10-I70.0). Electronically Signed   By: Helyn Numbers M.D.   On: 02/18/2023 01:59   DG Chest Portable 1 View  Result Date: 02/17/2023 CLINICAL DATA:  Hypoxia EXAM: PORTABLE CHEST 1 VIEW COMPARISON:  08/25/2022 FINDINGS: Lung volumes are small. Large hiatal hernia again noted. Associated left basilar interstitial changes and atelectasis is unchanged, better appreciated on CT examination of 08/25/2022. No pneumothorax or pleural effusion. Stable cardiomegaly. Left subclavian dual lead pacemaker is unchanged.  No acute bone abnormality. IMPRESSION: 1. Pulmonary hypoinflation. 2. Large hiatal hernia. Electronically Signed   By: Helyn Numbers M.D.   On: 02/17/2023 22:56    Pending Labs Unresulted Labs (From admission, onward)     Start     Ordered   02/19/23 0500  CBC with Differential/Platelet  Tomorrow morning,   R        02/18/23 1106   02/19/23 0500  Basic metabolic panel  Tomorrow morning,   R        02/18/23 1106   02/19/23 0500  Magnesium  Tomorrow morning,   R        02/18/23 1106   02/18/23 0037  Hemoglobin A1c  Add-on,   AD       Comments: To assess prior glycemic control    02/18/23 0036   02/17/23 2245  Blood Culture (routine x 2)  (Septic presentation on arrival (screening labs, nursing and treatment orders for obvious sepsis))  BLOOD  CULTURE X 2,   STAT      02/17/23 2246   02/17/23 2218  Urine Culture  Once,   URGENT       Question:  Indication  Answer:  Dysuria   02/17/23 2217            Vitals/Pain Today's Vitals   02/18/23 0930 02/18/23 1000 02/18/23 1030 02/18/23 1100  BP: 123/75 126/63 115/72 130/65  Pulse: 70 71 69 63  Resp: 19 16 (!) 28 17  Temp:      TempSrc:      SpO2: 94% 94% 94% 97%  Weight:      Height:      PainSc:        Isolation Precautions No active isolations  Medications Medications  acetaminophen (TYLENOL) tablet 650 mg (has no administration in time range)    Or  acetaminophen (TYLENOL) suppository 650 mg (has no administration in time range)  melatonin tablet 3 mg (has no administration in time range)  ondansetron (ZOFRAN) injection 4 mg (has no administration in time range)  cefTRIAXone (ROCEPHIN) 1 g in sodium chloride 0.9 % 100 mL IVPB (0 g Intravenous Stopped 02/18/23 0903)  azithromycin (ZITHROMAX) 500 mg in sodium chloride 0.9 % 250 mL IVPB (0 mg Intravenous Stopped 02/18/23 0514)  apixaban (ELIQUIS) tablet 5 mg (5 mg Oral Given 02/18/23 1033)  gabapentin (NEURONTIN) capsule 100 mg (100 mg Oral Given 02/18/23 1033)  insulin  aspart (novoLOG) injection 0-6 Units (1 Units Subcutaneous Given 02/18/23 0917)  lactated ringers infusion ( Intravenous New Bag/Given 02/18/23 1114)  methocarbamol (ROBAXIN) tablet 500 mg (has no administration in time range)  ceFEPIme (MAXIPIME) 2 g in sodium chloride 0.9 % 100 mL IVPB (0 g Intravenous Stopped 02/17/23 2350)  metroNIDAZOLE (FLAGYL) IVPB 500 mg (0 mg Intravenous Stopped 02/18/23 0130)  vancomycin (VANCOCIN) IVPB 1000 mg/200 mL premix (0 mg Intravenous Stopped 02/18/23 0247)  acetaminophen (TYLENOL) tablet 650 mg (650 mg Oral Given 02/17/23 2304)  sodium chloride 0.9 % bolus 500 mL (0 mLs Intravenous Stopped 02/18/23 0020)    Mobility walks with device

## 2023-02-18 NOTE — Evaluation (Signed)
Physical Therapy Evaluation Patient Details Name: Lindsey Ross MRN: [id number] DOB: 23-Jul-1925 Today's Date: 02/18/2023  History of Present Illness  Pt is 89 yo female admitted on 02/17/23 with generalized weakness potentially multifactorial contributing factors, including clinical suspicion for dehydration, as well as the possibility of underlying infectious process, although no definitive source of underlying infection has been identified at this time.  Pt with hx including but not limited to DVT/PE, pacemaker, DM2, neuropathy, CHF.  Clinical Impression   Pt admitted with above diagnosis.  At baseline, pt lives alone and ambulates with RW.  She reports that over the past few days she would develop back pain that radiated to abdomen with ambulation. Today, pt was min guard to supervision level for all transfers and to ambulate 400' with RW.  She did not develop the back pain today.  Pt's daughter present.  Pt and daughter both agree that pt's ambulation/mobility seems close to baseline.  She was on RA and O2 sats dropped to 89% with walking but recovered in <30 seconds to 95%.  Recommend continued mobility with nursing, family, or mobility team but no skilled PT services required.        Recommendations for follow up therapy are one component of a multi-disciplinary discharge planning process, led by the attending physician.  Recommendations may be updated based on patient status, additional functional criteria and insurance authorization.  Follow Up Recommendations       Assistance Recommended at Discharge PRN  Patient can return home with the following  Assistance with cooking/housework;Help with stairs or ramp for entrance    Equipment Recommendations None recommended by PT  Recommendations for Other Services       Functional Status Assessment Patient has not had a recent decline in their functional status     Precautions / Restrictions Precautions Precautions: Fall       Mobility  Bed Mobility   Bed Mobility: Supine to Sit     Supine to sit: HOB elevated, Supervision     General bed mobility comments: increased time but no assist needed    Transfers Overall transfer level: Needs assistance Equipment used: Rolling walker (2 wheels) Transfers: Sit to/from Stand Sit to Stand: Min guard           General transfer comment: Min guard for safety  ; demonstrated safely    Ambulation/Gait Ambulation/Gait assistance: Min guard, Supervision Gait Distance (Feet): 400 Feet Assistive device: Rolling walker (2 wheels) Gait Pattern/deviations: Step-through pattern, Decreased stride length Gait velocity: functional/baseline     General Gait Details: Kyphotic posture but good RW proximity. Started min guard progressed to supervision  Stairs            Wheelchair Mobility    Modified Rankin (Stroke Patients Only)       Balance Overall balance assessment: Needs assistance Sitting-balance support: No upper extremity supported Sitting balance-Leahy Scale: Good     Standing balance support: No upper extremity supported, Bilateral upper extremity supported Standing balance-Leahy Scale: Fair Standing balance comment: RW to ambulate but could static stand without support                             Pertinent Vitals/Pain Pain Assessment Pain Assessment: No/denies pain    Home Living Family/patient expects to be discharged to:: Private residence Living Arrangements: Alone Available Help at Discharge: Family;Available PRN/intermittently Type of Home: House Home Access: Stairs to enter Entrance Stairs-Rails: Right;Left Entrance Stairs-Number of Steps:  3 small   Home Layout: Multi-level;Laundry or work area in basement;Able to live on main level with bedroom/bathroom Home Equipment: Grab bars - Chartered loss adjuster (2 wheels)      Prior Function Prior Level of Function : Needs assist             Mobility  Comments: Reports ambulatory for short community distances with RW; 1 fall in past 6 months ADLs Comments: Reports independent with ADLs and light IADLs     Hand Dominance        Extremity/Trunk Assessment   Upper Extremity Assessment Upper Extremity Assessment: Overall WFL for tasks assessed (ROM WFL; MMT grossly 4+/5 throughout)    Lower Extremity Assessment Lower Extremity Assessment: Overall WFL for tasks assessed (ROM WFL; MMT grossly 5/5 throughout)    Cervical / Trunk Assessment Cervical / Trunk Assessment: Kyphotic  Communication   Communication: No difficulties  Cognition Arousal/Alertness: Awake/alert Behavior During Therapy: WFL for tasks assessed/performed Overall Cognitive Status: Within Functional Limits for tasks assessed                                          General Comments General comments (skin integrity, edema, etc.): Pt reports that for the past few days when she ambulates she gets mid back pain that radiates around her sides to abdomen and that pain becomes severe causing her to have to sit down quickly.  Pt ambulated 400' today and pain did not start - reports would have started by now.  She was on RA with activity with sats 95% rest.  Did drop to 89% with walking and talking but quickly (<30 sec) recovered to 94%.  Notified RN    Exercises     Assessment/Plan    PT Assessment Patient does not need any further PT services  PT Problem List         PT Treatment Interventions      PT Goals (Current goals can be found in the Care Plan section)  Acute Rehab PT Goals Patient Stated Goal: return home PT Goal Formulation: All assessment and education complete, DC therapy    Frequency       Co-evaluation               AM-PAC PT "6 Clicks" Mobility  Outcome Measure Help needed turning from your back to your side while in a flat bed without using bedrails?: None Help needed moving from lying on your back to sitting on the  side of a flat bed without using bedrails?: None Help needed moving to and from a bed to a chair (including a wheelchair)?: A Little Help needed standing up from a chair using your arms (e.g., wheelchair or bedside chair)?: A Little Help needed to walk in hospital room?: A Little Help needed climbing 3-5 steps with a railing? : A Little 6 Click Score: 20    End of Session Equipment Utilized During Treatment: Gait belt Activity Tolerance: Patient tolerated treatment well Patient left: in chair;with chair alarm set;with call bell/phone within reach;with family/visitor present Nurse Communication: Mobility status PT Visit Diagnosis: Other abnormalities of gait and mobility (R26.89)    Time: 8295-6213 PT Time Calculation (min) (ACUTE ONLY): 22 min   Charges:   PT Evaluation $PT Eval Low Complexity: 1 Low          Callen Vancuren, PT Acute Rehab [employer] [phone]  Billey Chang Wanisha Shiroma 02/18/2023, 5:50 PM

## 2023-02-19 DIAGNOSIS — R531 Weakness: Secondary | ICD-10-CM | POA: Diagnosis not present

## 2023-02-19 LAB — BASIC METABOLIC PANEL
Anion gap: 7 (ref 5–15)
BUN: 22 mg/dL (ref 8–23)
CO2: 27 mmol/L (ref 22–32)
Calcium: 9.3 mg/dL (ref 8.9–10.3)
Chloride: 105 mmol/L (ref 98–111)
Creatinine, Ser: 0.82 mg/dL (ref 0.44–1.00)
GFR, Estimated: >60 mL/min (ref >60)
Glucose, Bld: 108 mg/dL — ABNORMAL HIGH (ref 70–99)
Potassium: 3.6 mmol/L (ref 3.5–5.1)
Sodium: 139 mmol/L (ref 135–145)

## 2023-02-19 LAB — CBC WITH DIFFERENTIAL/PLATELET
Abs Immature Granulocytes: 0.02 10*3/uL (ref 0.00–0.07)
Basophils Absolute: 0 10*3/uL (ref 0.0–0.1)
Basophils Relative: 1 %
Eosinophils Absolute: 0.4 10*3/uL (ref 0.0–0.5)
Eosinophils Relative: 7 %
HCT: 37.6 % (ref 36.0–46.0)
Hemoglobin: 11.2 g/dL — ABNORMAL LOW (ref 12.0–15.0)
Immature Granulocytes: 0 %
Lymphocytes Relative: 25 %
Lymphs Abs: 1.6 10*3/uL (ref 0.7–4.0)
MCH: 25.6 pg — ABNORMAL LOW (ref 26.0–34.0)
MCHC: 29.8 g/dL — ABNORMAL LOW (ref 30.0–36.0)
MCV: 86 fL (ref 80.0–100.0)
Monocytes Absolute: 0.4 10*3/uL (ref 0.1–1.0)
Monocytes Relative: 7 %
Neutro Abs: 3.9 10*3/uL (ref 1.7–7.7)
Neutrophils Relative %: 60 %
Platelets: 209 10*3/uL (ref 150–400)
RBC: 4.37 MIL/uL (ref 3.87–5.11)
RDW: 15.3 % (ref 11.5–15.5)
WBC: 6.3 10*3/uL (ref 4.0–10.5)
nRBC: 0 % (ref 0.0–0.2)

## 2023-02-19 LAB — GLUCOSE, CAPILLARY: Glucose-Capillary: 102 mg/dL — ABNORMAL HIGH (ref 70–99)

## 2023-02-19 LAB — MAGNESIUM: Magnesium: 2.3 mg/dL (ref 1.7–2.4)

## 2023-02-19 LAB — HEMOGLOBIN A1C
Hgb A1c MFr Bld: 7.8 % — ABNORMAL HIGH (ref 4.8–5.6)
Mean Plasma Glucose: 177 mg/dL

## 2023-02-19 MED ORDER — CEPHALEXIN 500 MG PO CAPS: 500.0000 mg | ORAL_CAPSULE | Freq: Three times a day (TID) | ORAL | 0 refills | Status: AC

## 2023-02-19 MED ORDER — METHOCARBAMOL 500 MG PO TABS: 500.0000 mg | ORAL_TABLET | Freq: Four times a day (QID) | ORAL | 0 refills | Status: DC | PRN

## 2023-02-19 NOTE — Evaluation (Signed)
Occupational Therapy Evaluation Patient Details Name: Lindsey Ross MRN: [id number] DOB: Feb 01, 1925 Today's Date: 02/19/2023   History of Present Illness Pt is 89 yo female admitted on 02/17/23 with generalized weakness potentially multifactorial contributing factors, including clinical suspicion for dehydration, as well as the possibility of underlying infectious process, although no definitive source of underlying infection has been identified at this time.  Pt with hx including but not limited to DVT/PE, pacemaker, DM2, neuropathy, CHF.   Clinical Impression   Pt presents with decline in function and safety with LB ADLs and ADL mobility transfers. PTA pt lives alone and was Ind with ADLs,, light IADLs, uses RW for mobility. Pt's daughters assist with transportation. Pt currently requires min guard A with LB ADLs, Sup with toilet transfers and min guard A with walk in shower transfers. Pt's 2 daughters present and expressed concern about pt's showering safety and need for use of a shower chair. OT educated pt extensively about fall prevention and home use of a shower chair. Pt reports 1 fall at home this year (not in bathroom)Pt seemed to be resistive to use of a shower chair, however OT strongly recommends shower chair and long handle bath sponge for home use as well as grab bars in shower. Pt would benefit from acute OT services to address impairments to maximize level of function and safety     Recommendations for follow up therapy are one component of a multi-disciplinary discharge planning process, led by the attending physician.  Recommendations may be updated based on patient status, additional functional criteria and insurance authorization.   Assistance Recommended at Discharge PRN  Patient can return home with the following Assistance with cooking/housework;Help with stairs or ramp for entrance;A little help with bathing/dressing/bathroom    Functional Status Assessment  Patient has  had a recent decline in their functional status and demonstrates the ability to make significant improvements in function in a reasonable and predictable amount of time.  Equipment Recommendations  Tub/shower seat    Recommendations for Other Services       Precautions / Restrictions Precautions Precautions: Fall Restrictions Weight Bearing Restrictions: No      Mobility Bed Mobility   Bed Mobility: Supine to Sit     Supine to sit: HOB elevated, Supervision     General bed mobility comments: increased time but no assist needed    Transfers Overall transfer level: Needs assistance Equipment used: Rolling walker (2 wheels) Transfers: Sit to/from Stand Sit to Stand: Min guard           General transfer comment: Min guard for safety  ; demonstrated safely      Balance Overall balance assessment: Needs assistance Sitting-balance support: No upper extremity supported Sitting balance-Leahy Scale: Good     Standing balance support: No upper extremity supported, Bilateral upper extremity supported Standing balance-Leahy Scale: Fair                             ADL either performed or assessed with clinical judgement   ADL Overall ADL's : Needs assistance/impaired Eating/Feeding: Independent   Grooming: Wash/dry hands;Wash/dry face;Standing;Supervision/safety   Upper Body Bathing: Modified independent   Lower Body Bathing: Min guard   Upper Body Dressing : Modified independent   Lower Body Dressing: Min guard   Toilet Transfer: Min guard;Ambulation;Rolling walker (2 wheels);Cueing for safety;Grab bars   Toileting- Clothing Manipulation and Hygiene: Supervision/safety;Sit to/from stand       Functional mobility  during ADLs: Min guard;Rolling walker (2 wheels);Cueing for safety       Vision Baseline Vision/History: 1 Wears glasses Ability to See in Adequate Light: 0 Adequate Patient Visual Report: No change from baseline       Perception      Praxis      Pertinent Vitals/Pain Pain Assessment Pain Assessment: No/denies pain     Hand Dominance Right   Extremity/Trunk Assessment Upper Extremity Assessment Upper Extremity Assessment: Generalized weakness   Lower Extremity Assessment Lower Extremity Assessment: Defer to PT evaluation   Cervical / Trunk Assessment Cervical / Trunk Assessment: Kyphotic   Communication Communication Communication: No difficulties   Cognition Arousal/Alertness: Awake/alert Behavior During Therapy: WFL for tasks assessed/performed Overall Cognitive Status: Within Functional Limits for tasks assessed                                       General Comments       Exercises     Shoulder Instructions      Home Living Family/patient expects to be discharged to:: Private residence Living Arrangements: Alone Available Help at Discharge: Family;Available PRN/intermittently Type of Home: House Home Access: Stairs to enter Entrance Stairs-Number of Steps: 3 small Entrance Stairs-Rails: Right;Left Home Layout: Multi-level;Laundry or work area in basement;Able to live on main level with bedroom/bathroom     Bathroom Shower/Tub: Producer, television/film/video: Handicapped height     Home Equipment: Grab bars - Chartered loss adjuster (2 wheels);BSC/3in1          Prior Functioning/Environment Prior Level of Function : Needs assist             Mobility Comments: Reports ambulatory for short community distances with RW; 1 fall in past 6 months ADLs Comments: Reports independent with ADLs and light IADLs        OT Problem List: Decreased strength;Decreased activity tolerance;Decreased knowledge of use of DME or AE      OT Treatment/Interventions: Self-care/ADL training;DME and/or AE instruction;Therapeutic activities;Patient/family education    OT Goals(Current goals can be found in the care plan section) Acute Rehab OT Goals Patient Stated Goal: go  home OT Goal Formulation: With patient/family Time For Goal Achievement: 03/05/23 Potential to Achieve Goals: Good ADL Goals Pt Will Perform Lower Body Bathing: with supervision;with set-up;with modified independence Pt Will Perform Lower Body Dressing: with supervision;with set-up;with modified independence Pt Will Transfer to Toilet: with modified independence;ambulating Pt Will Perform Tub/Shower Transfer: with min guard assist;with supervision;ambulating;shower seat;grab bars  OT Frequency: Min 1X/week    Co-evaluation              AM-PAC OT "6 Clicks" Daily Activity     Outcome Measure Help from another person eating meals?: None Help from another person taking care of personal grooming?: A Little Help from another person toileting, which includes using toliet, bedpan, or urinal?: A Little Help from another person bathing (including washing, rinsing, drying)?: A Little Help from another person to put on and taking off regular upper body clothing?: None Help from another person to put on and taking off regular lower body clothing?: A Little 6 Click Score: 20   End of Session Equipment Utilized During Treatment: Gait belt;Rolling walker (2 wheels)  Activity Tolerance: Patient tolerated treatment well Patient left: in chair;with call bell/phone within reach;with chair alarm set;with family/visitor present  OT Visit Diagnosis: Muscle weakness (generalized) (M62.81);Other abnormalities of gait and  mobility (R26.89)                Time: 1610-9604 OT Time Calculation (min): 35 min Charges:  OT General Charges $OT Visit: 1 Visit OT Evaluation $OT Eval Low Complexity: 1 Low OT Treatments $Self Care/Home Management : 8-22 mins $Therapeutic Activity: 8-22 mins    Galen Manila 02/19/2023, 10:41 AM

## 2023-02-19 NOTE — Discharge Summary (Signed)
Physician Discharge Summary  ETSUKO HUMMEL ZOX:[id number] DOB: 09-14-1924 DOA: 02/17/2023  PCP: Chilton Greathouse, MD  Admit date: 02/17/2023 Discharge date: 02/19/2023  Admitted From: Home Disposition: Home  Recommendations for Outpatient Follow-up:  Follow up with PCP in 1 week with repeat CBC/BMP Follow up in ED if symptoms worsen or new appear   Home Health: No Equipment/Devices: None  Discharge Condition: Stable CODE STATUS: Full Diet recommendation: Heart healthy/carb modified/fluid restriction of up to 1500 cc a day  Brief/Interim Summary: 89 year old female with history of DVT/PE on Eliquis, high-grade heart block status post pacemaker placement in May 2019, diabetes mellitus type 2 with with diabetic peripheral neuropathy, chronic diastolic heart failure and recent outpatient treatment with oral Macrobid for possible UTI presented with generalized weakness. On presentation temperature max 100.9, she was tachycardic and slightly tachypneic, initially hypoxic on room air requiring 2 L oxygen via nasal cannula. WBC of 17.6; UA was negative. COVID-19/influenza/RSV PCR negative. Chest x-ray was negative for acute cardiopulmonary process. She was started on IV fluids and antibiotics.  During the hospitalization, her condition is improved.  Her leukocytosis has resolved.  She is not spiking any temperatures.  She tolerated PT.  She is hemodynamically stable and tolerating diet.  Cultures have been negative so far.   Discharge Diagnoses:   Generalized weakness Possible dehydration -Questionable cause.  May be related to dehydration. -Treated with IV fluids.  Improved.  Patient tolerated PT eval: Does not need PT follow-up -B12 level normal.   SIRS criteria -Presented with fever, leukocytosis, dehydration and weakness but no evidence of any infection including negative UA, chest x-ray and subsequent CT of chest.  Procalcitonin negative as well. -WBCs have improved.   Currently on  broad-spectrum antibiotics.  Cultures negative so far. She is not spiking any temperatures.  She is hemodynamically stable and tolerating diet.  -Of note, patient was recently on Macrobid for possible UTI and still was having some urinary symptoms including burning. -She will be discharged home today on 5 more days of oral Keflex.   Leukocytosis -Resolved   History of DVT/PE -Continue Eliquis   Diabetes mellitus type 2 with hyperglycemia Diabetic neuropathy -Continue home regimen.  Outpatient follow-up with PCP.  Carb modified diet. -Continue gabapentin   Chronic diastolic heart failure -Most recent echo from June 2021 had shown EF of 55 to 60% with grade 1 diastolic dysfunction.  Currently looks euvolemic.  Lasix will be resumed on discharged.  Continue diet and fluid restriction.  Outpatient follow-up with cardiology.   Physical deconditioning -Tolerated PT eval well.  Discharge Instructions  Discharge Instructions     Diet - low sodium heart healthy   Complete by: As directed    Increase activity slowly   Complete by: As directed       Allergies as of 02/19/2023       Reactions   Alendronate Sodium Other (See Comments)        Medication List     STOP taking these medications    Macrobid 100 MG capsule Generic drug: nitrofurantoin (macrocrystal-monohydrate)   telmisartan-hydrochlorothiazide 80-25 MG tablet Commonly known as: MICARDIS HCT       TAKE these medications    acetaminophen 500 MG tablet Commonly known as: TYLENOL Take 1,000 mg by mouth daily.   albuterol 108 (90 Base) MCG/ACT inhaler Commonly known as: VENTOLIN HFA Inhale 1-2 puffs into the lungs every 6 (six) hours as needed for wheezing or shortness of breath.   apixaban 5 MG Tabs tablet Commonly  known as: ELIQUIS Take 5 mg by mouth 2 (two) times daily.   CALCIUM + D3 PO Take 1 tablet by mouth 2 (two) times daily. Unknown strength   cephALEXin 500 MG capsule Commonly known as:  KEFLEX Take 1 capsule (500 mg total) by mouth 3 (three) times daily for 5 days. Start taking on: February 20, 2023   furosemide 40 MG tablet Commonly known as: LASIX Take 1 tablet (40 mg total) by mouth daily.   gabapentin 100 MG capsule Commonly known as: NEURONTIN Take 100 mg by mouth 2 (two) times daily.   Jardiance 10 MG Tabs tablet Generic drug: empagliflozin Take 10 mg by mouth daily.   methocarbamol 500 MG tablet Commonly known as: ROBAXIN Take 1 tablet (500 mg total) by mouth every 6 (six) hours as needed for muscle spasms.   multivitamin with minerals tablet Take 1 tablet by mouth daily. Unknown strength        Follow-up Information     Avva, Ravisankar, MD. Schedule an appointment as soon as possible for a visit in 1 week(s).   Specialty: Internal Medicine Contact information: [address] [place] [address] [phone]                Allergies  Allergen Reactions   Alendronate Sodium Other (See Comments)    Consultations: None.   Procedures/Studies: CT CHEST WO CONTRAST  Result Date: 02/18/2023 CLINICAL DATA:  Respiratory illness, leukocytosis, fever EXAM: CT CHEST WITHOUT CONTRAST TECHNIQUE: Multidetector CT imaging of the chest was performed following the standard protocol without IV contrast. RADIATION DOSE REDUCTION: This exam was performed according to the departmental dose-optimization program which includes automated exposure control, adjustment of the mA and/or kV according to patient size and/or use of iterative reconstruction technique. COMPARISON:  08/25/2022 FINDINGS: Cardiovascular: Moderate coronary artery calcification. Long segment stenting of the left anterior descending coronary artery noted. Mild global cardiomegaly. Left subclavian dual lead pacemaker in place with leads within the right atrium and right ventricle. No pericardial effusion. Central pulmonary arteries are enlarged in keeping with changes of pulmonary arterial  hypertension, unchanged. Moderate atherosclerotic calcification within the thoracic aorta. Mild dilation of the ascending thoracic aorta measuring 4.1 cm in greatest dimension. Descending thoracic aorta is of normal caliber. Mediastinum/Nodes: 14 mm nodule within the left thyroid gland is stable since prior examination and unlikely of clinical significance in a patient of this age. No follow-up imaging recommended. No pathologic thoracic adenopathy. Esophagus unremarkable. Large hiatal hernia. Lungs/Pleura: There is again seen subpleural reticulation, traction bronchiectasis, and mild honeycombing, best appreciated within the left lung base, demonstrating a basilar gradient in keeping with changes of UIP. Architectural changes and volume loss appears slightly progressive at the lung bases. Previously noted superimposed inflammatory infiltrates have resolved. No superimposed inflammatory infiltrate or focal consolidation identified. No discrete focal pulmonary nodules. No pneumothorax or pleural effusion. Upper Abdomen: No acute abnormality. Musculoskeletal: Osseous structures are age-appropriate. Multiple remote appearing compression deformities are seen within the visualized thoracolumbar spine. No acute bone abnormality. IMPRESSION: 1. No acute intrathoracic pathology identified. 2. Moderate coronary artery calcification. Mild global cardiomegaly. Morphologic changes in keeping with pulmonary arterial hypertension. 3. Stable 4.1 cm ascending thoracic aortic aneurysm. Recommend annual imaging followup by CTA or MRA. This recommendation follows 2010 ACCF/AHA/AATS/ACR/ASA/SCA/SCAI/SIR/STS/SVM Guidelines for the Diagnosis and Management of Patients with Thoracic Aortic Disease. Circulation. 2010; 121: G295-M841. Aortic aneurysm NOS (ICD10-I71.9) 4. Stable large hiatal hernia. 5. Stable 14 mm nodule within the left thyroid gland. No follow-up imaging recommended.  6. Multiple remote appearing compression deformities  within the visualized thoracolumbar spine. No acute bone abnormality. Aortic Atherosclerosis (ICD10-I70.0). Electronically Signed   By: Helyn Numbers M.D.   On: 02/18/2023 01:59   DG Chest Portable 1 View  Result Date: 02/17/2023 CLINICAL DATA:  Hypoxia EXAM: PORTABLE CHEST 1 VIEW COMPARISON:  08/25/2022 FINDINGS: Lung volumes are small. Large hiatal hernia again noted. Associated left basilar interstitial changes and atelectasis is unchanged, better appreciated on CT examination of 08/25/2022. No pneumothorax or pleural effusion. Stable cardiomegaly. Left subclavian dual lead pacemaker is unchanged. No acute bone abnormality. IMPRESSION: 1. Pulmonary hypoinflation. 2. Large hiatal hernia. Electronically Signed   By: Helyn Numbers M.D.   On: 02/17/2023 22:56      Subjective: Patient seen and examined at bedside.  Denies any fever, nausea or vomiting.  Tolerating her diet well.  Feels okay to go home today.  Daughters present at bedside.  Discharge Exam: Vitals:   02/18/23 1946 02/19/23 0040  BP: (!) 142/76 135/76  Pulse: 70 70  Resp: 20 20  Temp: 98.4 F (36.9 C) 98.1 F (36.7 C)  SpO2: 96% 93%    General: Pt is alert, awake, not in acute distress.  Elderly female sitting on chair.  On room air. Cardiovascular: rate controlled, S1/S2 + Respiratory: bilateral decreased breath sounds at bases Abdominal: Soft, NT, ND, bowel sounds + Extremities: no edema, no cyanosis    The results of significant diagnostics from this hospitalization (including imaging, microbiology, ancillary and laboratory) are listed below for reference.     Microbiology: Recent Results (from the past 240 hour(s))  Urine Culture     Status: Abnormal (Preliminary result)   Collection Time: 02/17/23 10:38 PM   Specimen: Urine, Clean Catch  Result Value Ref Range Status   Specimen Description   Final    URINE, CLEAN CATCH Performed at [facility], [address], [place], [place]  [address]    Special Requests   Final    NONE Performed at [facility], [address] [place], [place] [address]    Culture 30,000 COLONIES/mL GRAM NEGATIVE RODS (A)  Final   Report Status PENDING  Incomplete  Resp panel by RT-PCR (RSV, Flu A&B, Covid) Anterior Nasal Swab     Status: None   Collection Time: 02/17/23 10:45 PM   Specimen: Anterior Nasal Swab  Result Value Ref Range Status   SARS Coronavirus 2 by RT PCR NEGATIVE NEGATIVE Final    Comment: (NOTE) SARS-CoV-2 target nucleic acids are NOT DETECTED.  The SARS-CoV-2 RNA is generally detectable in upper respiratory specimens during the acute phase of infection. The lowest concentration of SARS-CoV-2 viral copies this assay can detect is 138 copies/mL. A negative result does not preclude SARS-Cov-2 infection and should not be used as the sole basis for treatment or other patient management decisions. A negative result may occur with  improper specimen collection/handling, submission of specimen other than nasopharyngeal swab, presence of viral mutation(s) within the areas targeted by this assay, and inadequate number of viral copies(<138 copies/mL). A negative result must be combined with clinical observations, patient history, and epidemiological information. The expected result is Negative.  Fact Sheet for Patients:  BloggerCourse.com  Fact Sheet for Healthcare Providers:  SeriousBroker.it  This test is no t yet approved or cleared by the [place] FDA and  has been authorized for detection and/or diagnosis of SARS-CoV-2 by FDA under an Emergency Use Authorization (EUA). This EUA will remain  in  effect (meaning this test can be used) for the duration of the COVID-19 declaration under Section 564(b)(1) of the Act, 21 U.S.C.section 360bbb-3(b)(1), unless the authorization is terminated  or revoked sooner.       Influenza A by PCR NEGATIVE  NEGATIVE Final   Influenza B by PCR NEGATIVE NEGATIVE Final    Comment: (NOTE) The Xpert Xpress SARS-CoV-2/FLU/RSV plus assay is intended as an aid in the diagnosis of influenza from Nasopharyngeal swab specimens and should not be used as a sole basis for treatment. Nasal washings and aspirates are unacceptable for Xpert Xpress SARS-CoV-2/FLU/RSV testing.  Fact Sheet for Patients: BloggerCourse.com  Fact Sheet for Healthcare Providers: SeriousBroker.it  This test is not yet approved or cleared by the [place] FDA and has been authorized for detection and/or diagnosis of SARS-CoV-2 by FDA under an Emergency Use Authorization (EUA). This EUA will remain in effect (meaning this test can be used) for the duration of the COVID-19 declaration under Section 564(b)(1) of the Act, 21 U.S.C. section 360bbb-3(b)(1), unless the authorization is terminated or revoked.     Resp Syncytial Virus by PCR NEGATIVE NEGATIVE Final    Comment: (NOTE) Fact Sheet for Patients: BloggerCourse.com  Fact Sheet for Healthcare Providers: SeriousBroker.it  This test is not yet approved or cleared by the [place] FDA and has been authorized for detection and/or diagnosis of SARS-CoV-2 by FDA under an Emergency Use Authorization (EUA). This EUA will remain in effect (meaning this test can be used) for the duration of the COVID-19 declaration under Section 564(b)(1) of the Act, 21 U.S.C. section 360bbb-3(b)(1), unless the authorization is terminated or revoked.  Performed at [facility], [address], [place], [place] [address]   Blood Culture (routine x 2)     Status: None (Preliminary result)   Collection Time: 02/17/23 10:45 PM   Specimen: BLOOD  Result Value Ref Range Status   Specimen Description   Final    BLOOD LEFT ANTECUBITAL Performed at [facility], [address], [place], [place] [address]    Special Requests   Final    BOTTLES DRAWN AEROBIC AND ANAEROBIC Blood Culture adequate volume Performed at [facility], [address] [place], [place] [address]    Culture   Final    NO GROWTH 1 DAY Performed at [facility], [address], [place], [place] [address]    Report Status PENDING  Incomplete  Blood Culture (routine x 2)     Status: None (Preliminary result)   Collection Time: 02/17/23 11:15 PM   Specimen: BLOOD  Result Value Ref Range Status   Specimen Description   Final    BLOOD RIGHT ANTECUBITAL Performed at [facility], [address] [place], [place] [address]    Special Requests   Final    BOTTLES DRAWN AEROBIC AND ANAEROBIC Blood Culture adequate volume Performed at [facility], [address], [place], [place] [address]    Culture   Final    NO GROWTH 1 DAY Performed at [facility], [address] [place], [place] [address]    Report Status PENDING  Incomplete     Labs: BNP (last 3 results) Recent Labs    08/25/22 1147 02/18/23 0110  BNP 146.7* 270.0*   Basic Metabolic Panel: Recent Labs  Lab 02/17/23 2210 02/18/23 0530 02/19/23 0706  NA 135 137 139  K 3.8 3.5 3.6  CL 97* 101 105  CO2  26 28 27   GLUCOSE 180* 197* 108*  BUN 25* 21 22  CREATININE 0.86 0.75 0.82  CALCIUM 10.1 9.5 9.3  MG 2.2 2.0 2.3   Liver Function Tests: Recent Labs  Lab 02/17/23 2210 02/18/23 0530  AST 18 16  ALT 11 9  ALKPHOS 42 30*  BILITOT 0.9 0.8  PROT 7.6 6.1*  ALBUMIN 4.0 3.0*   No results for input(s): "LIPASE", "AMYLASE" in the last 168 hours. No results for input(s): "AMMONIA" in the last 168 hours. CBC: Recent Labs  Lab 02/17/23 2210 02/18/23 0530 02/19/23 0706  WBC 17.6* 11.0* 6.3  NEUTROABS 16.2* 9.1* 3.9  HGB 12.9 10.9* 11.2*  HCT 42.1 35.4* 37.6  MCV 82.7 83.7 86.0  PLT 242 183 209   Cardiac  Enzymes: Recent Labs  Lab 02/17/23 0100  CKTOTAL 19*   BNP: Invalid input(s): "POCBNP" CBG: Recent Labs  Lab 02/18/23 0903 02/18/23 1307 02/18/23 1620 02/19/23 0741  GLUCAP 151* 123* 198* 102*   D-Dimer No results for input(s): "DDIMER" in the last 72 hours. Hgb A1c Recent Labs    02/17/23 2210  HGBA1C 7.8*   Lipid Profile No results for input(s): "CHOL", "HDL", "LDLCALC", "TRIG", "CHOLHDL", "LDLDIRECT" in the last 72 hours. Thyroid function studies Recent Labs    02/18/23 0110  TSH 1.220   Anemia work up Recent Labs    02/18/23 0110  VITAMINB12 725   Urinalysis    Component Value Date/Time   COLORURINE YELLOW 02/17/2023 2238   APPEARANCEUR HAZY (A) 02/17/2023 2238   LABSPEC 1.024 02/17/2023 2238   PHURINE 5.0 02/17/2023 2238   GLUCOSEU >=500 (A) 02/17/2023 2238   HGBUR SMALL (A) 02/17/2023 2238   BILIRUBINUR NEGATIVE 02/17/2023 2238   KETONESUR 20 (A) 02/17/2023 2238   PROTEINUR NEGATIVE 02/17/2023 2238   NITRITE NEGATIVE 02/17/2023 2238   LEUKOCYTESUR NEGATIVE 02/17/2023 2238   Sepsis Labs Recent Labs  Lab 02/17/23 2210 02/18/23 0530 02/19/23 0706  WBC 17.6* 11.0* 6.3   Microbiology Recent Results (from the past 240 hour(s))  Urine Culture     Status: Abnormal (Preliminary result)   Collection Time: 02/17/23 10:38 PM   Specimen: Urine, Clean Catch  Result Value Ref Range Status   Specimen Description   Final    URINE, CLEAN CATCH Performed at [facility], [address] [place], [place] [address]    Special Requests   Final    NONE Performed at [facility], [address], [place], [place] [address]    Culture 30,000 COLONIES/mL GRAM NEGATIVE RODS (A)  Final   Report Status PENDING  Incomplete  Resp panel by RT-PCR (RSV, Flu A&B, Covid) Anterior Nasal Swab     Status: None   Collection Time: 02/17/23 10:45 PM   Specimen: Anterior Nasal Swab  Result Value Ref Range Status   SARS Coronavirus 2  by RT PCR NEGATIVE NEGATIVE Final    Comment: (NOTE) SARS-CoV-2 target nucleic acids are NOT DETECTED.  The SARS-CoV-2 RNA is generally detectable in upper respiratory specimens during the acute phase of infection. The lowest concentration of SARS-CoV-2 viral copies this assay can detect is 138 copies/mL. A negative result does not preclude SARS-Cov-2 infection and should not be used as the sole basis for treatment or other patient management decisions. A negative result may occur with  improper specimen collection/handling, submission of specimen other than nasopharyngeal swab, presence of viral mutation(s) within the areas targeted by this assay, and inadequate number of viral copies(<138 copies/mL). A  negative result must be combined with clinical observations, patient history, and epidemiological information. The expected result is Negative.  Fact Sheet for Patients:  BloggerCourse.com  Fact Sheet for Healthcare Providers:  SeriousBroker.it  This test is no t yet approved or cleared by the [place] FDA and  has been authorized for detection and/or diagnosis of SARS-CoV-2 by FDA under an Emergency Use Authorization (EUA). This EUA will remain  in effect (meaning this test can be used) for the duration of the COVID-19 declaration under Section 564(b)(1) of the Act, 21 U.S.C.section 360bbb-3(b)(1), unless the authorization is terminated  or revoked sooner.       Influenza A by PCR NEGATIVE NEGATIVE Final   Influenza B by PCR NEGATIVE NEGATIVE Final    Comment: (NOTE) The Xpert Xpress SARS-CoV-2/FLU/RSV plus assay is intended as an aid in the diagnosis of influenza from Nasopharyngeal swab specimens and should not be used as a sole basis for treatment. Nasal washings and aspirates are unacceptable for Xpert Xpress SARS-CoV-2/FLU/RSV testing.  Fact Sheet for Patients: BloggerCourse.com  Fact  Sheet for Healthcare Providers: SeriousBroker.it  This test is not yet approved or cleared by the [place] FDA and has been authorized for detection and/or diagnosis of SARS-CoV-2 by FDA under an Emergency Use Authorization (EUA). This EUA will remain in effect (meaning this test can be used) for the duration of the COVID-19 declaration under Section 564(b)(1) of the Act, 21 U.S.C. section 360bbb-3(b)(1), unless the authorization is terminated or revoked.     Resp Syncytial Virus by PCR NEGATIVE NEGATIVE Final    Comment: (NOTE) Fact Sheet for Patients: BloggerCourse.com  Fact Sheet for Healthcare Providers: SeriousBroker.it  This test is not yet approved or cleared by the [place] FDA and has been authorized for detection and/or diagnosis of SARS-CoV-2 by FDA under an Emergency Use Authorization (EUA). This EUA will remain in effect (meaning this test can be used) for the duration of the COVID-19 declaration under Section 564(b)(1) of the Act, 21 U.S.C. section 360bbb-3(b)(1), unless the authorization is terminated or revoked.  Performed at [facility], [address] [place], [place] [address]   Blood Culture (routine x 2)     Status: None (Preliminary result)   Collection Time: 02/17/23 10:45 PM   Specimen: BLOOD  Result Value Ref Range Status   Specimen Description   Final    BLOOD LEFT ANTECUBITAL Performed at [facility], [address] [place], [place] [address]    Special Requests   Final    BOTTLES DRAWN AEROBIC AND ANAEROBIC Blood Culture adequate volume Performed at [facility], [address], [place], [place] [address]    Culture   Final    NO GROWTH 1 DAY Performed at [facility], [address], [place], [place] [address]    Report Status PENDING  Incomplete  Blood Culture (routine x 2)     Status: None  (Preliminary result)   Collection Time: 02/17/23 11:15 PM   Specimen: BLOOD  Result Value Ref Range Status   Specimen Description   Final    BLOOD RIGHT ANTECUBITAL Performed at [facility], [address], [place], [place] [address]    Special Requests   Final    BOTTLES DRAWN AEROBIC AND ANAEROBIC Blood Culture adequate volume Performed at [facility], [address] [place], [place] [address]    Culture   Final    NO GROWTH 1 DAY Performed at  [facility], [address] [place], [place] [address]    Report Status PENDING  Incomplete     Time coordinating discharge: 35 minutes  SIGNED:   Glade Lloyd, MD  [facility] 02/19/2023, 10:25 AM

## 2023-02-19 NOTE — TOC Initial Note (Signed)
Transition of Care [facility]) - Initial/Assessment Note    Patient Details  Name: Lindsey Ross MRN: [id number] Date of Birth: March 16, 1925  Transition of Care [facility]) CM/SW Contact:    Adrian Prows, RN Phone Number: 02/19/2023, 11:16 AM  Clinical Narrative:                 [facility] consult for d/c planning; pt says she is from home and plans to return at d/c; she identified POC dtr Awilda Bill [phone]); she denies IPV, food/housing insecurity, and difficulty paying utilities; she has transportation; pt says she has glass and bilateral hearing aids; pt pt has grab bars in shower, walker, and cane; pt says she does not have HH services or home oxygen; no TOC needs.  Expected Discharge Plan: Home/Self Care Barriers to Discharge: No Barriers Identified   Patient Goals and CMS Choice Patient states their goals for this hospitalization and ongoing recovery are:: home   Choice offered to / list presented to : NA      Expected Discharge Plan and Services   Discharge Planning Services: CM Consult Post Acute Care Choice: NA Living arrangements for the past 2 months: Single Family Home Expected Discharge Date: 02/19/23               DME Arranged: N/A DME Agency: NA       HH Arranged: NA HH Agency: NA        Prior Living Arrangements/Services Living arrangements for the past 2 months: Single Family Home Lives with:: Self Patient language and need for interpreter reviewed:: Yes Do you feel safe going back to the place where you live?: Yes      Need for Family Participation in Patient Care: Yes (Comment) Care giver support system in place?: Yes (comment) Current home services: DME (cane, walker) Criminal Activity/Legal Involvement Pertinent to Current Situation/Hospitalization: No - Comment as needed  Activities of Daily Living Home Assistive Devices/Equipment: Walker (specify type) ADL Screening (condition at time of admission) Patient's cognitive ability adequate to  safely complete daily activities?: Yes Is the patient deaf or have difficulty hearing?: No Does the patient have difficulty seeing, even when wearing glasses/contacts?: No Does the patient have difficulty concentrating, remembering, or making decisions?: No Patient able to express need for assistance with ADLs?: Yes Does the patient have difficulty dressing or bathing?: Yes Independently performs ADLs?: No Does the patient have difficulty walking or climbing stairs?: No Weakness of Legs: Both Weakness of Arms/Hands: None  Permission Sought/Granted Permission sought to share information with : Case Manager Permission granted to share information with : Yes, Verbal Permission Granted  Share Information with NAME: Case Manager     Permission granted to share info w Relationship: Awilda Bill ([phone]     Emotional Assessment Appearance:: Appears stated age Attitude/Demeanor/Rapport: Gracious Affect (typically observed): Accepting Orientation: : Oriented to Self, Oriented to Place, Oriented to  Time, Oriented to Situation Alcohol / Substance Use: Not Applicable Psych Involvement: No (comment)  Admission diagnosis:  Hypoxia [R09.02] Generalized weakness [R53.1] Fever, unspecified fever cause [R50.9] Patient Active Problem List   Diagnosis Date Noted   SIRS (systemic inflammatory response syndrome) (HCC) 02/18/2023   Dehydration 02/18/2023   Acute prerenal azotemia 02/18/2023   History of pulmonary embolism 02/18/2023   Chronic diastolic CHF (congestive heart failure) (HCC) 02/18/2023   Generalized weakness 02/17/2023   Hypertension    Presence of permanent cardiac pacemaker    Frequent urination    Burning sensation of feet  Arthritis    Chronic kidney disease, stage 3a (HCC) 01/24/2020   Dysphagia 01/24/2020   DM2 (diabetes mellitus, type 2) (HCC) 07/10/2018   Pacemaker 01/13/2018   Pulmonary hypertension, unspecified (HCC) 12/28/2017   Acute embolism and  thrombosis of unspecified deep veins of left lower extremity (HCC) 07/05/2017   Long term (current) use of anticoagulants 07/05/2017   Acute deep vein thrombosis (DVT) of left tibial vein (HCC) 06/26/2017   Aortic atherosclerosis (HCC) 06/25/2017   Pulmonary hypertension (HCC) 06/24/2017   Second degree atrioventricular block 06/24/2017   DVT (deep venous thrombosis) (HCC) 06/24/2017   Shortness of breath 06/23/2017   Bradyarrhythmia 06/23/2017   Backache 06/20/2017   Disequilibrium 06/20/2017   Late effect of fracture of upper extremity 12/24/2016   Injury 12/24/2016   Disorder of musculoskeletal system 11/24/2016   Degeneration of thoracic intervertebral disc 11/24/2016   Acute pain of right wrist 08/31/2016   Acute bilateral low back pain without sciatica 08/31/2016   Closed fracture distal radius and ulna, right, initial encounter 08/31/2016   Encounter for general adult medical examination without abnormal findings 12/08/2015   Osteoarthritis of right hip 03/25/2015   Status post total replacement of right hip 03/25/2015   Right hip pain 11/29/2014   Overactive bladder 09/23/2011   Allergic rhinitis 09/17/2010   Peripheral venous insufficiency 03/31/2010   Age-related osteoporosis without current pathological fracture 09/16/2009   Cyst of pancreas 09/16/2009   Essential hypertension 09/04/2009   Anemia 09/04/2009   Atherosclerosis 09/04/2009   Chronic kidney disease due to hypertension 09/04/2009   Disorder of vertebral column 09/04/2009   Diaphragmatic hernia 09/04/2009   Hyperlipidemia 09/04/2009   PCP:  Chilton Greathouse, MD Pharmacy:   [id number] - [place], [place] - [address] AT [facility] OF [address] [place] [address] Phone: [phone] Fax: [phone]  [id number] - [place], [place] - [address] [place] [address] Phone: [phone] Fax: [phone]  [id number] [place], [place] - [address] [place] [address] Phone: [phone] Fax: [phone]     Social Determinants of Health (SDOH) Social History: SDOH Screenings   Food Insecurity: No Food Insecurity (02/19/2023)  Housing: Patient Declined (02/19/2023)  Transportation Needs: No Transportation Needs (02/19/2023)  Utilities: Not At Risk (02/19/2023)  Tobacco Use: Low Risk  (02/18/2023)   SDOH Interventions: Food Insecurity Interventions: Intervention Not Indicated, Inpatient TOC Housing Interventions: Intervention Not Indicated, Inpatient TOC Transportation Interventions: Intervention Not Indicated, Inpatient TOC Utilities Interventions: Intervention Not Indicated, Inpatient TOC   Readmission Risk Interventions     No data to display

## 2023-02-20 LAB — URINE CULTURE: Culture: 30000 — AB

## 2023-02-20 LAB — CULTURE, BLOOD (ROUTINE X 2): Special Requests: ADEQUATE

## 2023-02-21 LAB — CULTURE, BLOOD (ROUTINE X 2)
Culture: NO GROWTH
Special Requests: ADEQUATE

## 2023-02-22 LAB — CULTURE, BLOOD (ROUTINE X 2)

## 2023-02-23 LAB — CULTURE, BLOOD (ROUTINE X 2): Culture: NO GROWTH

## 2023-03-17 ENCOUNTER — Ambulatory Visit (HOSPITAL_COMMUNITY)
Admission: RE | Admit: 2023-03-17 | Discharge: 2023-03-17 | Disposition: A | Discharge status: Home / Self Care | Payer: Medicare HMO | Source: Ambulatory Visit | Attending: Internal Medicine | Admitting: Internal Medicine

## 2023-03-17 DIAGNOSIS — M810 Age-related osteoporosis without current pathological fracture: Secondary | ICD-10-CM | POA: Insufficient documentation

## 2023-03-17 MED ORDER — DENOSUMAB 60 MG/ML ~~LOC~~ SOSY
PREFILLED_SYRINGE | SUBCUTANEOUS | Status: AC
  Administered 2023-03-17: 60 mg via SUBCUTANEOUS
  Filled 2023-03-17: qty 1

## 2023-03-17 MED ORDER — DENOSUMAB 60 MG/ML ~~LOC~~ SOSY: 60.0000 mg | PREFILLED_SYRINGE | Freq: Once | SUBCUTANEOUS | Status: AC

## 2023-04-06 LAB — CUP PACEART REMOTE DEVICE CHECK
Battery Remaining Longevity: 50 mo
Battery Voltage: 2.89 V
Brady Statistic AP VP Percent: 15.13 %
Brady Statistic AP VS Percent: 0 %
Brady Statistic AS VP Percent: 84.84 %
Brady Statistic AS VS Percent: 0.01 %
Brady Statistic RA Percent Paced: 14.61 %
Brady Statistic RV Percent Paced: 99.99 %
Date Time Interrogation Session: 20240814225842
Implantable Lead Connection Status: 753985
Implantable Lead Connection Status: 753985
Implantable Lead Implant Date: 20181115
Implantable Lead Implant Date: 20181115
Implantable Lead Location: 753859
Implantable Lead Location: 753860
Implantable Lead Model: 5076
Implantable Lead Model: 5076
Implantable Pulse Generator Implant Date: 20181115
Lead Channel Impedance Value: 304 Ohm
Lead Channel Impedance Value: 380 Ohm
Lead Channel Impedance Value: 399 Ohm
Lead Channel Impedance Value: 475 Ohm
Lead Channel Pacing Threshold Amplitude: 0.625 V
Lead Channel Pacing Threshold Amplitude: 1.25 V
Lead Channel Pacing Threshold Pulse Width: 0.4 ms
Lead Channel Pacing Threshold Pulse Width: 0.4 ms
Lead Channel Sensing Intrinsic Amplitude: 0.5 mV
Lead Channel Sensing Intrinsic Amplitude: 0.5 mV
Lead Channel Sensing Intrinsic Amplitude: 9.625 mV
Lead Channel Sensing Intrinsic Amplitude: 9.625 mV
Lead Channel Setting Pacing Amplitude: 2 V
Lead Channel Setting Pacing Amplitude: 3.25 V
Lead Channel Setting Pacing Pulse Width: 0.4 ms
Lead Channel Setting Sensing Sensitivity: 1.2 mV
Zone Setting Status: 755011
Zone Setting Status: 755011

## 2023-04-07 ENCOUNTER — Ambulatory Visit: Payer: Medicare HMO

## 2023-04-07 DIAGNOSIS — I441 Atrioventricular block, second degree: Secondary | ICD-10-CM

## 2023-04-19 NOTE — Progress Notes (Signed)
Remote pacemaker transmission.   

## 2023-07-07 ENCOUNTER — Ambulatory Visit (INDEPENDENT_AMBULATORY_CARE_PROVIDER_SITE_OTHER): Payer: Medicare HMO

## 2023-07-07 DIAGNOSIS — I441 Atrioventricular block, second degree: Secondary | ICD-10-CM | POA: Diagnosis not present

## 2023-07-07 LAB — CUP PACEART REMOTE DEVICE CHECK
Battery Remaining Longevity: 52 mo
Battery Voltage: 2.88 V
Brady Statistic AP VP Percent: 18.65 %
Brady Statistic AP VS Percent: 0 %
Brady Statistic AS VP Percent: 81.34 %
Brady Statistic AS VS Percent: 0.01 %
Brady Statistic RA Percent Paced: 18.6 %
Brady Statistic RV Percent Paced: 99.99 %
Date Time Interrogation Session: 20241113201343
Implantable Lead Connection Status: 753985
Implantable Lead Connection Status: 753985
Implantable Lead Implant Date: 20181115
Implantable Lead Implant Date: 20181115
Implantable Lead Location: 753859
Implantable Lead Location: 753860
Implantable Lead Model: 5076
Implantable Lead Model: 5076
Implantable Pulse Generator Implant Date: 20181115
Lead Channel Impedance Value: 323 Ohm
Lead Channel Impedance Value: 399 Ohm
Lead Channel Impedance Value: 437 Ohm
Lead Channel Impedance Value: 494 Ohm
Lead Channel Pacing Threshold Amplitude: 0.75 V
Lead Channel Pacing Threshold Amplitude: 1.125 V
Lead Channel Pacing Threshold Pulse Width: 0.4 ms
Lead Channel Pacing Threshold Pulse Width: 0.4 ms
Lead Channel Sensing Intrinsic Amplitude: 0.625 mV
Lead Channel Sensing Intrinsic Amplitude: 0.625 mV
Lead Channel Sensing Intrinsic Amplitude: 9.625 mV
Lead Channel Sensing Intrinsic Amplitude: 9.625 mV
Lead Channel Setting Pacing Amplitude: 2 V
Lead Channel Setting Pacing Amplitude: 2.75 V
Lead Channel Setting Pacing Pulse Width: 0.4 ms
Lead Channel Setting Sensing Sensitivity: 1.2 mV
Zone Setting Status: 755011
Zone Setting Status: 755011

## 2023-07-20 NOTE — Progress Notes (Signed)
Remote pacemaker transmission.   

## 2023-09-03 ENCOUNTER — Telehealth: Payer: Self-pay

## 2023-09-03 ENCOUNTER — Ambulatory Visit (INDEPENDENT_AMBULATORY_CARE_PROVIDER_SITE_OTHER): Payer: Medicare HMO | Admitting: Radiology

## 2023-09-03 ENCOUNTER — Ambulatory Visit
Admission: RE | Admit: 2023-09-03 | Discharge: 2023-09-03 | Disposition: A | Discharge status: Home / Self Care | Payer: Medicare HMO | Source: Ambulatory Visit | Attending: Family Medicine | Admitting: Family Medicine

## 2023-09-03 VITALS — BP 129/87 | HR 96 | Temp 97.5°F | Resp 18

## 2023-09-03 DIAGNOSIS — N1831 Chronic kidney disease, stage 3a: Secondary | ICD-10-CM | POA: Insufficient documentation

## 2023-09-03 DIAGNOSIS — R0609 Other forms of dyspnea: Secondary | ICD-10-CM | POA: Diagnosis not present

## 2023-09-03 DIAGNOSIS — R300 Dysuria: Secondary | ICD-10-CM | POA: Insufficient documentation

## 2023-09-03 DIAGNOSIS — E1122 Type 2 diabetes mellitus with diabetic chronic kidney disease: Secondary | ICD-10-CM | POA: Diagnosis not present

## 2023-09-03 DIAGNOSIS — I5032 Chronic diastolic (congestive) heart failure: Secondary | ICD-10-CM | POA: Diagnosis not present

## 2023-09-03 DIAGNOSIS — Z86711 Personal history of pulmonary embolism: Secondary | ICD-10-CM | POA: Insufficient documentation

## 2023-09-03 DIAGNOSIS — I130 Hypertensive heart and chronic kidney disease with heart failure and stage 1 through stage 4 chronic kidney disease, or unspecified chronic kidney disease: Secondary | ICD-10-CM | POA: Insufficient documentation

## 2023-09-03 LAB — POCT URINALYSIS DIP (MANUAL ENTRY)
Bilirubin, UA: NEGATIVE
Glucose, UA: >=1000 mg/dL — AB
Ketones, POC UA: NEGATIVE mg/dL
Nitrite, UA: NEGATIVE
Protein Ur, POC: NEGATIVE mg/dL
Spec Grav, UA: 1.015
Urobilinogen, UA: 0.2 U/dL
pH, UA: 7

## 2023-09-03 MED ORDER — CLOTRIMAZOLE-BETAMETHASONE 1-0.05 % EX CREA: TOPICAL_CREAM | CUTANEOUS | 0 refills | Status: DC

## 2023-09-03 MED ORDER — AMOXICILLIN 500 MG PO CAPS: 1000.0000 mg | ORAL_CAPSULE | Freq: Two times a day (BID) | ORAL | 0 refills | Status: AC

## 2023-09-03 MED ORDER — AZITHROMYCIN 250 MG PO TABS: 250.0000 mg | ORAL_TABLET | Freq: Every day | ORAL | 0 refills | Status: DC

## 2023-09-03 NOTE — ED Notes (Signed)
 Patient is being discharged from the Urgent Care and sent to the Emergency Department via private vehicle with daughter . Per Jon PA, patient is in need of higher level of care due to dyspnea on exertion & dysuria. Patient is aware and verbalizes understanding of plan of care.  Vitals:   09/03/23 1438  BP: 129/87  Pulse: 96  Resp: 18  Temp: (!) 97.5 F (36.4 C)  SpO2: 91%

## 2023-09-03 NOTE — Telephone Encounter (Signed)
 This RN called patient at this time as she has an appointment scheduled at 2:30 PM today. Name and DOB verified. In patient comments for appointment, Difficulty breathing, right shoulder pain, O2 saturation ranging from 89-94. - Entered by patient. This RN did call patient and states the limited resources we could do for her here at urgent care, ultimately advised her to go to emergency department as she does state shortness of breath on exertion, not typically on oxygen at home, very abnormal for me. Pt states I do not want to go to emergency department. I am also having burning with urination. This RN states that we are able to run urinalysis here, although the symptoms she is reporting are concerning & needs further evaluation at the ER. Pt refused. RN did advise if she was coming to urgent care to do so before 2:30 PM, states I am going to wait for some of the snow to melt, I am fine to wait until 2:30 PM.

## 2023-09-03 NOTE — ED Triage Notes (Addendum)
 Pt presents with complaints of sharp, stabbing pain on right upper back, shortness of breath on exertion, and burning with urination x a couple of weeks. Tylenol  1000 mg taken twice daily with no improvement. Pt currently rates her overall pain a 3/10, pain increases when using the bathroom. No recent falls or injuries. Pt denies oxygen use at home.

## 2023-09-03 NOTE — ED Provider Notes (Signed)
 [facility]    CSN: [id number] Arrival date & time: 09/03/23  1421      History   Chief Complaint Chief Complaint  Patient presents with   Burning with Urination    Shortness of Breath   Back Pain    HPI Lindsey Ross is a 89 y.o. female.   The history is provided by the patient and a relative.  Shortness of Breath Associated symptoms: chest pain   Associated symptoms: no cough, no fever, no vomiting and no wheezing   Back Pain Associated symptoms: chest pain and dysuria   Associated symptoms: no fever   Shortness of breath with exertion onset for several weeks.  Also having episodes of back pain, 2 days ago had sharp pain in her right upper back unrelieved with a pain patch.  Admits swelling in her legs which is not uncommon for her.  Has had chest discomfort at times none currently Denies rhinorrhea, nasal congestion, cough, wheezing. Admits dysuria, states get some burning pain at the end of urination for several weeks.  Denies frequency, urgency, hematuria, cloudy urine.  Admits urinary incontinence.  Past Medical History:  Diagnosis Date   Acute bilateral low back pain without sciatica 08/31/2016   Acute deep vein thrombosis (DVT) of left tibial vein (HCC) 06/26/2017   Acute pain of right wrist 08/31/2016   Aortic atherosclerosis (HCC) 06/25/2017   Arthritis    right ankle; maybe some in her back (07/07/2017)   Bradyarrhythmia 06/23/2017   Burning sensation of feet    Bilateral feet   Closed fracture distal radius and ulna, right, initial encounter 08/31/2016   DVT (deep venous thrombosis) (HCC) 06/24/2017   LLE   Essential hypertension 06/23/2017   Frequent urination    Heart block AV second degree 06/24/2017   Hypertension    Osteoarthritis of right hip 03/25/2015   Pacemaker 01/13/2018   Presence of permanent cardiac pacemaker    Pulmonary embolism (HCC) 06/24/2017   Pulmonary hypertension, unspecified (HCC) 12/28/2017   70 mmHg by echo in October 2018    Shortness of breath dyspnea    with exertion   SOB (shortness of breath) on exertion 06/23/2017   Status post total replacement of right hip 03/25/2015    Patient Active Problem List   Diagnosis Date Noted   SIRS (systemic inflammatory response syndrome) (HCC) 02/18/2023   Dehydration 02/18/2023   Acute prerenal azotemia 02/18/2023   History of pulmonary embolism 02/18/2023   Chronic diastolic CHF (congestive heart failure) (HCC) 02/18/2023   Generalized weakness 02/17/2023   Hypertension    Presence of permanent cardiac pacemaker    Frequent urination    Burning sensation of feet    Arthritis    Chronic kidney disease, stage 3a (HCC) 01/24/2020   Dysphagia 01/24/2020   DM2 (diabetes mellitus, type 2) (HCC) 07/10/2018   Pacemaker 01/13/2018   Pulmonary hypertension, unspecified (HCC) 12/28/2017   Acute embolism and thrombosis of unspecified deep veins of left lower extremity (HCC) 07/05/2017   Long term (current) use of anticoagulants 07/05/2017   Acute deep vein thrombosis (DVT) of left tibial vein (HCC) 06/26/2017   Aortic atherosclerosis (HCC) 06/25/2017   Pulmonary hypertension (HCC) 06/24/2017   Second degree atrioventricular block 06/24/2017   DVT (deep venous thrombosis) (HCC) 06/24/2017   Shortness of breath 06/23/2017   Bradyarrhythmia 06/23/2017   Backache 06/20/2017   Disequilibrium 06/20/2017   Late effect of fracture of upper extremity 12/24/2016   Injury 12/24/2016   Disorder of musculoskeletal  system 11/24/2016   Degeneration of thoracic intervertebral disc 11/24/2016   Acute pain of right wrist 08/31/2016   Acute bilateral low back pain without sciatica 08/31/2016   Closed fracture distal radius and ulna, right, initial encounter 08/31/2016   Encounter for general adult medical examination without abnormal findings 12/08/2015   Osteoarthritis of right hip 03/25/2015   Status post total replacement of right hip 03/25/2015   Right hip pain 11/29/2014    Overactive bladder 09/23/2011   Allergic rhinitis 09/17/2010   Peripheral venous insufficiency 03/31/2010   Age-related osteoporosis without current pathological fracture 09/16/2009   Cyst of pancreas 09/16/2009   Essential hypertension 09/04/2009   Anemia 09/04/2009   Atherosclerosis 09/04/2009   Chronic kidney disease due to hypertension 09/04/2009   Disorder of vertebral column 09/04/2009   Diaphragmatic hernia 09/04/2009   Hyperlipidemia 09/04/2009    Past Surgical History:  Procedure Laterality Date   ABDOMINAL HYSTERECTOMY     ANKLE FRACTURE SURGERY Right 2003   CATARACT EXTRACTION W/ INTRAOCULAR LENS  IMPLANT, BILATERAL Bilateral    FRACTURE SURGERY     INSERT / REPLACE / REMOVE PACEMAKER  07/07/2017   JOINT REPLACEMENT     LAPAROSCOPIC CHOLECYSTECTOMY     PACEMAKER IMPLANT N/A 07/07/2017   Procedure: PACEMAKER IMPLANT;  Surgeon: Inocencio Soyla Lunger, MD;  Location: [facility] INVASIVE CV [facility];  Service: Cardiovascular;  Laterality: N/A;   TOTAL HIP ARTHROPLASTY Right 03/25/2015   Procedure: RIGHT TOTAL HIP ARTHROPLASTY ANTERIOR APPROACH;  Surgeon: Lonni CINDERELLA Poli, MD;  Location: [facility] OR;  Service: Orthopedics;  Laterality: Right;    OB History   No obstetric history on file.      Home Medications    Prior to Admission medications   Medication Sig Start Date End Date Taking? Authorizing Provider  acetaminophen  (TYLENOL ) 500 MG tablet Take 1,000 mg by mouth daily.    [provider]  albuterol  (PROVENTIL  HFA;VENTOLIN  HFA) 108 (90 Base) MCG/ACT inhaler Inhale 1-2 puffs into the lungs every 6 (six) hours as needed for wheezing or shortness of breath.    [provider]  apixaban  (ELIQUIS ) 5 MG TABS tablet Take 5 mg by mouth 2 (two) times daily.    [provider]  Calcium  Carb-Cholecalciferol  (CALCIUM  + D3 PO) Take 1 tablet by mouth 2 (two) times daily. Unknown strength    [provider]  furosemide  (LASIX ) 40 MG tablet Take 1 tablet (40  mg total) by mouth daily. 11/05/22   Krasowski, Robert J, MD  gabapentin  (NEURONTIN ) 100 MG capsule Take 100 mg by mouth 2 (two) times daily.    [provider]  JARDIANCE 10 MG TABS tablet Take 10 mg by mouth daily. 12/17/22   [provider]  methocarbamol  (ROBAXIN ) 500 MG tablet Take 1 tablet (500 mg total) by mouth every 6 (six) hours as needed for muscle spasms. 02/19/23   Cheryle Page, MD  Multiple Vitamins-Minerals (MULTIVITAMIN WITH MINERALS) tablet Take 1 tablet by mouth daily. Unknown strength    [provider]    Family History Family History  Problem Relation Age of Onset   Pulmonary embolism Neg Hx     Social History Social History   Tobacco Use   Smoking status: Never   Smokeless tobacco: Never  Vaping Use   Vaping status: Never Used  Substance Use Topics   Alcohol use: No   Drug use: No     Allergies   Alendronate sodium   Review of Systems Review of Systems  Constitutional:  Positive for fatigue. Negative for fever.  Respiratory:  Positive for shortness of breath. Negative for cough and wheezing.   Cardiovascular:  Positive for chest pain and leg swelling.  Gastrointestinal:  Negative for diarrhea, nausea and vomiting.  Genitourinary:  Positive for dysuria. Negative for frequency and urgency.  Musculoskeletal:  Positive for back pain.     Physical Exam Triage Vital Signs ED Triage Vitals  Encounter Vitals Group     BP 09/03/23 1438 129/87     Systolic BP Percentile --      Diastolic BP Percentile --      Pulse Rate 09/03/23 1438 96     Resp 09/03/23 1438 18     Temp 09/03/23 1438 (!) 97.5 F (36.4 C)     Temp Source 09/03/23 1438 Oral     SpO2 09/03/23 1438 91 %     Weight --      Height --      Head Circumference --      Peak Flow --      Pain Score 09/03/23 1436 3     Pain Loc --      Pain Education --      Exclude from Growth Chart --    No data found.  Updated Vital Signs BP 129/87 (BP Location: Right  Arm)   Pulse 96   Temp (!) 97.5 F (36.4 C) (Oral)   Resp 18   SpO2 91% Comment: O2 saturation ranging from 88%-94% on room air.  Visual Acuity Right Eye Distance:   Left Eye Distance:   Bilateral Distance:    Right Eye Near:   Left Eye Near:    Bilateral Near:     Physical Exam Vitals reviewed. Exam conducted with a chaperone present.  Constitutional:      Appearance: She is not ill-appearing.  HENT:     Head: Normocephalic.  Cardiovascular:     Rate and Rhythm: Normal rate.  Pulmonary:     Breath sounds: Decreased breath sounds present. No rhonchi or rales.  Genitourinary:    Exam position: Lithotomy position.     Vagina: No vaginal discharge.     Comments: Erythema labia minora and introitus Neurological:     Mental Status: She is alert.      UC Treatments / Results  Labs (all labs ordered are listed, but only abnormal results are displayed) Labs Reviewed  POCT URINALYSIS DIP (MANUAL ENTRY) - Abnormal; Notable for the following components:      Result Value   Glucose, UA >=1,000 (*)    Blood, UA trace-intact (*)    Leukocytes, UA Small (1+) (*)    All other components within normal limits  URINE CULTURE    EKG   Radiology No results found.  Procedures Procedures (including critical care time)  Medications Ordered in UC Medications - No data to display  Initial Impression / Assessment and Plan / UC Course  I have reviewed the triage vital signs and the nursing notes.  89 year old female reports complaining dyspnea on exertion for several weeks also having back pain and dysuria.  Past medical history clued retention, pulmonary embolism, pacemaker, atherosclerosis, kidney disease, second-degree AV block. She is nontoxic-appearing however becomes dyspneic on exertion, her oxygen saturation is 91% at rest.  She has bilateral pitting edema.  Discussed with patient and family differential diagnosis includes congestive heart failure, coronary artery  disease, PE.  She was strongly encouraged to go to the emergency department for further evaluation and treatment.  Family is concerned for pneumonia, will check chest x-ray.  Patient reports dysuria for several weeks, point-of-care urinalysis has greater than 1000 glucose, trace RBCs and small leuks but no nitrates, urine is clear and yellow not cloudy.  Will send urine culture. Has visible irritation genital region will try Lotrisone , counseled to follow-up if symptoms fail to improve. Chest x-ray independently viewed by me cardiomegaly, pacemaker hernia, not see evidence of pneumonia.  Again recommend ED evaluation.  Family states patient refusing to go to ED.  Discussed my concerns with patient guarding need for further workup.   X-ray report received after patient left our department, streaky  left basilar opacities atelectasis versus infiltrate, large hiatal hernia.  Doubt pneumonia as patient has not had cough or fever. Called and spoke with daughter discussed x-ray report.  Patient has refused to go to ED given the circumstances we will put patient on antibiotic to cover community-acquired pneumonia however again recommend ED evaluation Final Clinical Impressions(s) / UC Diagnoses   Final diagnoses:  DOE (dyspnea on exertion)  Dysuria   Discharge Instructions   None    ED Prescriptions   None    PDMP not reviewed this encounter.   Demba Nigh, [place] 09/03/23 1546

## 2023-09-03 NOTE — Discharge Instructions (Signed)
 Go to the emergency department for further evaluation and treatment  Test results will be released to your MyChart account We will contact you if anything is positive and requires treatment.  Urine culture 2 to 3 days we will contact you if the results are positive and require treatment.  In the meantime use the Lotrisone  to affected area as discussed

## 2023-09-05 LAB — URINE CULTURE: Culture: NO GROWTH

## 2023-09-09 ENCOUNTER — Ambulatory Visit: Payer: Medicare HMO | Admitting: Cardiology

## 2023-10-06 ENCOUNTER — Ambulatory Visit (INDEPENDENT_AMBULATORY_CARE_PROVIDER_SITE_OTHER): Payer: Medicare HMO

## 2023-10-06 DIAGNOSIS — I441 Atrioventricular block, second degree: Secondary | ICD-10-CM | POA: Diagnosis not present

## 2023-10-08 LAB — CUP PACEART REMOTE DEVICE CHECK
Battery Remaining Longevity: 53 mo
Battery Voltage: 2.88 V
Brady Statistic AP VP Percent: 4.39 %
Brady Statistic AP VS Percent: 0 %
Brady Statistic AS VP Percent: 95.39 %
Brady Statistic AS VS Percent: 0.21 %
Brady Statistic RA Percent Paced: 4.47 %
Brady Statistic RV Percent Paced: 99.78 %
Date Time Interrogation Session: 20250212205259
Implantable Lead Connection Status: 753985
Implantable Lead Connection Status: 753985
Implantable Lead Implant Date: 20181115
Implantable Lead Implant Date: 20181115
Implantable Lead Location: 753859
Implantable Lead Location: 753860
Implantable Lead Model: 5076
Implantable Lead Model: 5076
Implantable Pulse Generator Implant Date: 20181115
Lead Channel Impedance Value: 304 Ohm
Lead Channel Impedance Value: 380 Ohm
Lead Channel Impedance Value: 456 Ohm
Lead Channel Impedance Value: 475 Ohm
Lead Channel Pacing Threshold Amplitude: 0.875 V
Lead Channel Pacing Threshold Amplitude: 1 V
Lead Channel Pacing Threshold Pulse Width: 0.4 ms
Lead Channel Pacing Threshold Pulse Width: 0.4 ms
Lead Channel Sensing Intrinsic Amplitude: 0.375 mV
Lead Channel Sensing Intrinsic Amplitude: 0.375 mV
Lead Channel Sensing Intrinsic Amplitude: 9.625 mV
Lead Channel Sensing Intrinsic Amplitude: 9.625 mV
Lead Channel Setting Pacing Amplitude: 2 V
Lead Channel Setting Pacing Amplitude: 2.5 V
Lead Channel Setting Pacing Pulse Width: 0.4 ms
Lead Channel Setting Sensing Sensitivity: 1.2 mV
Zone Setting Status: 755011
Zone Setting Status: 755011

## 2023-11-09 NOTE — Progress Notes (Signed)
 Remote pacemaker transmission.

## 2023-11-09 NOTE — Addendum Note (Signed)
 Addended by: Elease Etienne A on: 11/09/2023 09:39 AM   Modules accepted: Orders

## 2024-01-22 DEATH — deceased
